# Patient Record
Sex: Female | Born: 1950 | Race: White | Hispanic: No | Marital: Married | Smoking: Never smoker
Health system: Southern US, Community
[De-identification: ages and names within clinical notes are randomized; demographics above are authoritative.]

## PROBLEM LIST (undated history)

## (undated) DIAGNOSIS — B029 Zoster without complications: Secondary | ICD-10-CM

## (undated) DIAGNOSIS — Z96659 Presence of unspecified artificial knee joint: Secondary | ICD-10-CM

## (undated) DIAGNOSIS — Z85850 Personal history of malignant neoplasm of thyroid: Secondary | ICD-10-CM

## (undated) DIAGNOSIS — R7303 Prediabetes: Secondary | ICD-10-CM

## (undated) DIAGNOSIS — C801 Malignant (primary) neoplasm, unspecified: Secondary | ICD-10-CM

## (undated) DIAGNOSIS — Z9889 Other specified postprocedural states: Secondary | ICD-10-CM

## (undated) DIAGNOSIS — Z5189 Encounter for other specified aftercare: Secondary | ICD-10-CM

## (undated) DIAGNOSIS — Z789 Other specified health status: Secondary | ICD-10-CM

## (undated) DIAGNOSIS — C73 Malignant neoplasm of thyroid gland: Secondary | ICD-10-CM

## (undated) DIAGNOSIS — G4730 Sleep apnea, unspecified: Secondary | ICD-10-CM

## (undated) DIAGNOSIS — N189 Chronic kidney disease, unspecified: Secondary | ICD-10-CM

## (undated) DIAGNOSIS — M1990 Unspecified osteoarthritis, unspecified site: Secondary | ICD-10-CM

## (undated) DIAGNOSIS — IMO0001 Reserved for inherently not codable concepts without codable children: Secondary | ICD-10-CM

## (undated) DIAGNOSIS — I10 Essential (primary) hypertension: Secondary | ICD-10-CM

## (undated) DIAGNOSIS — D1800 Hemangioma unspecified site: Secondary | ICD-10-CM

## (undated) DIAGNOSIS — R112 Nausea with vomiting, unspecified: Secondary | ICD-10-CM

## (undated) DIAGNOSIS — G709 Myoneural disorder, unspecified: Secondary | ICD-10-CM

## (undated) DIAGNOSIS — Z973 Presence of spectacles and contact lenses: Secondary | ICD-10-CM

## (undated) DIAGNOSIS — K219 Gastro-esophageal reflux disease without esophagitis: Secondary | ICD-10-CM

## (undated) DIAGNOSIS — E039 Hypothyroidism, unspecified: Secondary | ICD-10-CM

## (undated) HISTORY — PX: JOINT REPLACEMENT: SHX530

## (undated) HISTORY — DX: Other specified health status: Z78.9

## (undated) HISTORY — DX: Hemangioma unspecified site: D18.00

## (undated) HISTORY — PX: CHOLECYSTECTOMY: SHX55

## (undated) HISTORY — PX: APPENDECTOMY: SHX54

## (undated) HISTORY — PX: COLOSTOMY REVERSAL: SHX5782

## (undated) HISTORY — DX: Malignant neoplasm of thyroid gland: C73

## (undated) HISTORY — PX: COLON SURGERY: SHX602

## (undated) HISTORY — PX: GANGLION CYST EXCISION: SHX1691

## (undated) HISTORY — PX: OTHER SURGICAL HISTORY: SHX169

---
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
## 2005-12-18 ENCOUNTER — Inpatient Hospital Stay: Payer: Self-pay | Admitting: General Surgery

## 2005-12-22 ENCOUNTER — Other Ambulatory Visit: Payer: Self-pay

## 2006-01-15 ENCOUNTER — Inpatient Hospital Stay: Payer: Self-pay | Admitting: Surgery

## 2006-05-02 ENCOUNTER — Ambulatory Visit: Payer: Self-pay | Admitting: Surgery

## 2006-05-06 ENCOUNTER — Ambulatory Visit: Payer: Self-pay | Admitting: Surgery

## 2006-05-24 ENCOUNTER — Ambulatory Visit: Payer: Self-pay | Admitting: Surgery

## 2006-05-24 ENCOUNTER — Other Ambulatory Visit: Payer: Self-pay

## 2006-05-31 ENCOUNTER — Inpatient Hospital Stay: Payer: Self-pay | Admitting: Surgery

## 2007-01-05 HISTORY — PX: TOTAL KNEE ARTHROPLASTY: SHX125

## 2007-01-05 HISTORY — PX: THYROIDECTOMY: SHX17

## 2007-05-08 ENCOUNTER — Ambulatory Visit: Payer: Self-pay | Admitting: Specialist

## 2007-05-08 ENCOUNTER — Other Ambulatory Visit: Payer: Self-pay

## 2007-05-25 ENCOUNTER — Inpatient Hospital Stay: Payer: Self-pay | Admitting: Specialist

## 2007-09-29 ENCOUNTER — Ambulatory Visit: Payer: Self-pay | Admitting: Family Medicine

## 2009-06-24 ENCOUNTER — Encounter: Admission: RE | Admit: 2009-06-24 | Discharge: 2009-06-24 | Payer: Self-pay | Admitting: Orthopedic Surgery

## 2009-06-30 ENCOUNTER — Encounter: Admission: RE | Admit: 2009-06-30 | Discharge: 2009-06-30 | Payer: Self-pay | Admitting: Orthopedic Surgery

## 2009-07-15 ENCOUNTER — Encounter: Admission: RE | Admit: 2009-07-15 | Discharge: 2009-07-15 | Payer: Self-pay | Admitting: Orthopedic Surgery

## 2009-08-27 ENCOUNTER — Encounter: Admission: RE | Admit: 2009-08-27 | Discharge: 2009-08-27 | Payer: Self-pay | Admitting: Orthopedic Surgery

## 2010-10-22 ENCOUNTER — Other Ambulatory Visit (HOSPITAL_COMMUNITY): Payer: Self-pay | Admitting: Orthopedic Surgery

## 2010-10-22 DIAGNOSIS — T84033A Mechanical loosening of internal left knee prosthetic joint, initial encounter: Secondary | ICD-10-CM

## 2010-10-28 ENCOUNTER — Ambulatory Visit (HOSPITAL_COMMUNITY): Payer: Self-pay

## 2010-10-28 ENCOUNTER — Encounter (HOSPITAL_COMMUNITY)
Admission: RE | Admit: 2010-10-28 | Discharge: 2010-10-28 | Disposition: A | Discharge status: Home / Self Care | Payer: 59 | Source: Ambulatory Visit | Attending: Orthopedic Surgery | Admitting: Orthopedic Surgery

## 2010-10-28 DIAGNOSIS — Z96659 Presence of unspecified artificial knee joint: Secondary | ICD-10-CM | POA: Insufficient documentation

## 2010-10-28 DIAGNOSIS — M25569 Pain in unspecified knee: Secondary | ICD-10-CM | POA: Insufficient documentation

## 2010-10-28 DIAGNOSIS — M79609 Pain in unspecified limb: Secondary | ICD-10-CM | POA: Insufficient documentation

## 2010-10-28 DIAGNOSIS — T84033A Mechanical loosening of internal left knee prosthetic joint, initial encounter: Secondary | ICD-10-CM

## 2010-10-28 DIAGNOSIS — Z85850 Personal history of malignant neoplasm of thyroid: Secondary | ICD-10-CM | POA: Insufficient documentation

## 2010-10-28 MED ORDER — TECHNETIUM TC 99M MEDRONATE IV KIT
25.0000 | PACK | Freq: Once | INTRAVENOUS | Status: AC | PRN
  Administered 2010-10-28: 25 via INTRAVENOUS

## 2010-11-18 ENCOUNTER — Other Ambulatory Visit: Payer: Self-pay | Admitting: Orthopedic Surgery

## 2010-11-19 ENCOUNTER — Other Ambulatory Visit: Payer: Self-pay | Admitting: Orthopedic Surgery

## 2010-12-02 ENCOUNTER — Encounter (HOSPITAL_COMMUNITY): Payer: Self-pay | Admitting: Pharmacy Technician

## 2010-12-04 ENCOUNTER — Encounter (HOSPITAL_COMMUNITY): Payer: Self-pay

## 2010-12-04 ENCOUNTER — Other Ambulatory Visit: Payer: Self-pay

## 2010-12-04 ENCOUNTER — Encounter (HOSPITAL_COMMUNITY)
Admission: RE | Admit: 2010-12-04 | Discharge: 2010-12-04 | Disposition: A | Discharge status: Home / Self Care | Payer: 59 | Source: Ambulatory Visit | Attending: Orthopedic Surgery | Admitting: Orthopedic Surgery

## 2010-12-04 ENCOUNTER — Encounter (HOSPITAL_COMMUNITY)
Admission: RE | Admit: 2010-12-04 | Discharge: 2010-12-04 | Disposition: A | Discharge status: Home / Self Care | Payer: 59 | Source: Ambulatory Visit | Attending: Anesthesiology | Admitting: Anesthesiology

## 2010-12-04 HISTORY — DX: Reserved for inherently not codable concepts without codable children: IMO0001

## 2010-12-04 HISTORY — DX: Malignant (primary) neoplasm, unspecified: C80.1

## 2010-12-04 HISTORY — DX: Unspecified osteoarthritis, unspecified site: M19.90

## 2010-12-04 HISTORY — DX: Nausea with vomiting, unspecified: R11.2

## 2010-12-04 HISTORY — DX: Encounter for other specified aftercare: Z51.89

## 2010-12-04 HISTORY — DX: Other specified postprocedural states: Z98.890

## 2010-12-04 HISTORY — DX: Hypothyroidism, unspecified: E03.9

## 2010-12-04 HISTORY — DX: Essential (primary) hypertension: I10

## 2010-12-04 HISTORY — DX: Gastro-esophageal reflux disease without esophagitis: K21.9

## 2010-12-04 LAB — BASIC METABOLIC PANEL
CO2: 27 mEq/L (ref 19–32)
Calcium: 9.6 mg/dL (ref 8.4–10.5)
GFR calc Af Amer: 86 mL/min — ABNORMAL LOW (ref >90)
GFR calc non Af Amer: 75 mL/min — ABNORMAL LOW (ref >90)
Sodium: 141 mEq/L (ref 135–145)

## 2010-12-04 LAB — CBC
MCV: 93.5 fL (ref 78.0–100.0)
Platelets: 278 10*3/uL (ref 150–400)
RBC: 3.98 MIL/uL (ref 3.87–5.11)
WBC: 7.4 10*3/uL (ref 4.0–10.5)

## 2010-12-04 LAB — PROTIME-INR
INR: 1 (ref 0.00–1.49)
Prothrombin Time: 13.4 seconds (ref 11.6–15.2)

## 2010-12-04 LAB — APTT: aPTT: 27 seconds (ref 24–37)

## 2010-12-04 LAB — TYPE AND SCREEN: ABO/RH(D): O POS

## 2010-12-04 NOTE — Pre-Procedure Instructions (Addendum)
20 Pamela Dodson [facility]  12/04/2010   Your procedure is scheduled on:  December 12  Report to [facility] at 10:45 AM.  Call this number if you have problems the morning of surgery: [phone]   Remember:   Do not eat food:After Midnight.  May have clear liquids: up to 4 Hours before arrival.  Clear liquids include soda, tea, black coffee, apple or grape juice, broth.  Take these medicines the morning of surgery with A SIP OF WATER: synthroid, prilosec, tramadol   Do not wear jewelry, make-up or nail polish.  Do not wear lotions, powders, or perfumes. You may wear deodorant.  Do not shave 48 hours prior to surgery.  Do not bring valuables to the hospital.  Contacts, dentures or bridgework may not be worn into surgery.  Leave suitcase in the car. After surgery it may be brought to your room.  For patients admitted to the hospital, checkout time is 11:00 AM the day of discharge.   Patients discharged the day of surgery will not be allowed to drive home.  Name and phone number of your driver: NA  Special Instructions: Incentive Spirometry - Practice and bring it with you on the day of surgery. and CHG Shower Use Special Wash: 1/2 bottle night before surgery and 1/2 bottle morning of surgery.   Please read over the following fact sheets that you were given: Pain Booklet, Coughing and Deep Breathing, Blood Transfusion Information, Total Joint Packet and Surgical Site Infection Prevention

## 2010-12-15 ENCOUNTER — Encounter (HOSPITAL_COMMUNITY): Payer: Self-pay | Admitting: Orthopedic Surgery

## 2010-12-15 NOTE — H&P (Signed)
  HISTORY OF PRESENT ILLNESS:  Pamela Dodson is a 60 year old patient of Dr. Charlett Blake who comes in today complaining of pain in her left knee.  She is status post bilateral knee arthroplasty.  Her surgeries were done by Dr. Reita Chard in [place] 3 years ago.  She had both knees replaced on the same day. 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  She has had 2 intra-articular cortisone injections that provided her with significant short-term relief. She reports that she actually got relief of her left knee pain while her left hip injection was working.  She localizes most of her knee pain to the lateral side.  The right total  knee continues to do well.  ROS: Patient denies dizziness, nausea, fever, chills, vomiting, shortness of breath, chest pain, loss of appetite, or rash.    PHYSICAL EXAM: Well-developed, well-nourished.  Awake, alert, and oriented x3.  Extraocular motion is intact.  No use of accessory respiratory muscles for breathing.   Cardiovascular exam reveals a regular rhythm.  Skin is intact without cuts, scrapes, or abrasions. The left knee demonstrates a well healed surgical incision.  She is nontender to palpation along the joint line.  Range of motion is 0-110 degrees.  The hip demonstrates pain with internal rotation. She has no pain with external rotation.  Foot tap is negative.  She also reports some left knee pain with internal rotation of the left hip.  RADIOGRAPHS: Other reviewed images - Two views taken of the left knee taken a couple of weeks ago demonstrates well placed, well fixed total knee components without evidence of loosening.  We also reviewed her hip x-rays that Dr. Charlett Blake took and she has end stage arthritis of the left hip.  IMPRESSION:   1.    Painful left total knee. 2.    End stage arthritis of the left hip.  PLAN:  We have reviewed a bone scan of the lower extremities to rule out any loosening of the total knee parts. There is no evidence of  loosening or infection in either total knee.  We have advised [name] that her knee pain is most likely related to her hip arthritis.  At this point she would like to proceed with left total hip arthroplasty.  All risks and benefits of surgery were discussed with the patient.

## 2010-12-16 ENCOUNTER — Encounter (HOSPITAL_COMMUNITY): Payer: Self-pay | Admitting: Anesthesiology

## 2010-12-16 ENCOUNTER — Encounter (HOSPITAL_COMMUNITY)
Admission: RE | Disposition: A | Discharge status: Home Health | Payer: Self-pay | Source: Ambulatory Visit | Attending: Orthopedic Surgery

## 2010-12-16 ENCOUNTER — Encounter (HOSPITAL_COMMUNITY): Payer: Self-pay | Admitting: Orthopedic Surgery

## 2010-12-16 ENCOUNTER — Inpatient Hospital Stay (HOSPITAL_COMMUNITY): Payer: 59

## 2010-12-16 ENCOUNTER — Inpatient Hospital Stay (HOSPITAL_COMMUNITY)
Admission: RE | Admit: 2010-12-16 | Discharge: 2010-12-19 | DRG: 470 | Disposition: A | Discharge status: Home Health | Payer: 59 | Source: Ambulatory Visit | Attending: Orthopedic Surgery | Admitting: Orthopedic Surgery

## 2010-12-16 ENCOUNTER — Inpatient Hospital Stay (HOSPITAL_COMMUNITY): Payer: 59 | Admitting: Anesthesiology

## 2010-12-16 DIAGNOSIS — M129 Arthropathy, unspecified: Secondary | ICD-10-CM | POA: Diagnosis present

## 2010-12-16 DIAGNOSIS — I10 Essential (primary) hypertension: Secondary | ICD-10-CM | POA: Diagnosis present

## 2010-12-16 DIAGNOSIS — M1610 Unilateral primary osteoarthritis, unspecified hip: Principal | ICD-10-CM

## 2010-12-16 DIAGNOSIS — E039 Hypothyroidism, unspecified: Secondary | ICD-10-CM | POA: Diagnosis present

## 2010-12-16 DIAGNOSIS — Z85850 Personal history of malignant neoplasm of thyroid: Secondary | ICD-10-CM

## 2010-12-16 DIAGNOSIS — M169 Osteoarthritis of hip, unspecified: Principal | ICD-10-CM | POA: Diagnosis present

## 2010-12-16 DIAGNOSIS — K219 Gastro-esophageal reflux disease without esophagitis: Secondary | ICD-10-CM | POA: Diagnosis present

## 2010-12-16 HISTORY — PX: TOTAL HIP ARTHROPLASTY: SHX124

## 2010-12-16 SURGERY — ARTHROPLASTY, HIP, TOTAL,POSTERIOR APPROACH
Anesthesia: General | Site: Hip | Laterality: Left | Wound class: Clean

## 2010-12-16 MED ORDER — NEOSTIGMINE METHYLSULFATE 1 MG/ML IJ SOLN
INTRAMUSCULAR | Status: DC | PRN
  Administered 2010-12-16: 5 mg via INTRAVENOUS

## 2010-12-16 MED ORDER — GLYCOPYRROLATE 0.2 MG/ML IJ SOLN
INTRAMUSCULAR | Status: DC | PRN
  Administered 2010-12-16: .7 mg via INTRAVENOUS

## 2010-12-16 MED ORDER — PHENYLEPHRINE HCL 10 MG/ML IJ SOLN
INTRAMUSCULAR | Status: DC | PRN
  Administered 2010-12-16 (×3): 80 ug via INTRAVENOUS

## 2010-12-16 MED ORDER — SODIUM CHLORIDE 0.9 % IR SOLN
Status: DC | PRN
  Administered 2010-12-16: 1000 mL

## 2010-12-16 MED ORDER — ROCURONIUM BROMIDE 100 MG/10ML IV SOLN
INTRAVENOUS | Status: DC | PRN
  Administered 2010-12-16: 50 mg via INTRAVENOUS

## 2010-12-16 MED ORDER — PROPOFOL 10 MG/ML IV EMUL
INTRAVENOUS | Status: DC | PRN
  Administered 2010-12-16: 100 mg via INTRAVENOUS

## 2010-12-16 MED ORDER — KCL IN DEXTROSE-NACL 20-5-0.45 MEQ/L-%-% IV SOLN
INTRAVENOUS | Status: DC
  Administered 2010-12-16 – 2010-12-17 (×2): via INTRAVENOUS
  Filled 2010-12-16 (×12): qty 1000

## 2010-12-16 MED ORDER — ZOLPIDEM TARTRATE 5 MG PO TABS: 5.0000 mg | ORAL_TABLET | Freq: Every evening | ORAL | Status: DC | PRN

## 2010-12-16 MED ORDER — SIMVASTATIN 20 MG PO TABS
20.0000 mg | ORAL_TABLET | Freq: Every day | ORAL | Status: DC
  Administered 2010-12-16 – 2010-12-18 (×3): 20 mg via ORAL
  Filled 2010-12-16 (×4): qty 1

## 2010-12-16 MED ORDER — MIDAZOLAM HCL 2 MG/2ML IJ SOLN
1.0000 mg | Freq: Once | INTRAMUSCULAR | Status: AC
  Administered 2010-12-16: 1 mg via INTRAVENOUS

## 2010-12-16 MED ORDER — BISACODYL 5 MG PO TBEC: 5.0000 mg | DELAYED_RELEASE_TABLET | Freq: Every day | ORAL | Status: DC | PRN

## 2010-12-16 MED ORDER — WARFARIN VIDEO: Freq: Once | Status: DC

## 2010-12-16 MED ORDER — BUPIVACAINE-EPINEPHRINE 0.5% -1:200000 IJ SOLN
INTRAMUSCULAR | Status: DC | PRN
  Administered 2010-12-16: 10 mL

## 2010-12-16 MED ORDER — HYDROMORPHONE HCL PF 1 MG/ML IJ SOLN
0.2500 mg | INTRAMUSCULAR | Status: DC | PRN
  Administered 2010-12-16 (×4): 0.5 mg via INTRAVENOUS

## 2010-12-16 MED ORDER — COUMADIN BOOK
Freq: Once | Status: DC
  Filled 2010-12-16: qty 1

## 2010-12-16 MED ORDER — CHLORHEXIDINE GLUCONATE 4 % EX LIQD: 60.0000 mL | Freq: Once | CUTANEOUS | Status: DC

## 2010-12-16 MED ORDER — METOCLOPRAMIDE HCL 10 MG PO TABS
5.0000 mg | ORAL_TABLET | Freq: Three times a day (TID) | ORAL | Status: DC | PRN
  Administered 2010-12-18 (×2): 10 mg via ORAL
  Filled 2010-12-16: qty 1

## 2010-12-16 MED ORDER — LACTATED RINGERS IV SOLN
INTRAVENOUS | Status: DC
  Administered 2010-12-16: 11:00:00 via INTRAVENOUS

## 2010-12-16 MED ORDER — LACTATED RINGERS IV SOLN
INTRAVENOUS | Status: DC | PRN
  Administered 2010-12-16 (×2): via INTRAVENOUS

## 2010-12-16 MED ORDER — MAGNESIUM HYDROXIDE 400 MG/5ML PO SUSP
30.0000 mL | Freq: Every day | ORAL | Status: DC | PRN
  Administered 2010-12-18: 30 mL via ORAL
  Filled 2010-12-16: qty 30

## 2010-12-16 MED ORDER — HYDROMORPHONE HCL PF 1 MG/ML IJ SOLN
INTRAMUSCULAR | Status: AC
  Filled 2010-12-16: qty 1

## 2010-12-16 MED ORDER — BENAZEPRIL HCL 20 MG PO TABS
20.0000 mg | ORAL_TABLET | Freq: Every day | ORAL | Status: DC
  Administered 2010-12-16 – 2010-12-17 (×2): 20 mg via ORAL
  Filled 2010-12-16 (×3): qty 1

## 2010-12-16 MED ORDER — METOCLOPRAMIDE HCL 5 MG/ML IJ SOLN
5.0000 mg | Freq: Three times a day (TID) | INTRAMUSCULAR | Status: DC | PRN
  Filled 2010-12-16: qty 2

## 2010-12-16 MED ORDER — ENOXAPARIN SODIUM 40 MG/0.4ML ~~LOC~~ SOLN
40.0000 mg | SUBCUTANEOUS | Status: DC
  Administered 2010-12-17 – 2010-12-19 (×3): 40 mg via SUBCUTANEOUS
  Filled 2010-12-16 (×4): qty 0.4

## 2010-12-16 MED ORDER — WARFARIN SODIUM 5 MG PO TABS
5.0000 mg | ORAL_TABLET | Freq: Once | ORAL | Status: AC
  Administered 2010-12-16: 5 mg via ORAL
  Filled 2010-12-16: qty 1

## 2010-12-16 MED ORDER — MEPERIDINE HCL 25 MG/ML IJ SOLN
6.2500 mg | INTRAMUSCULAR | Status: DC | PRN
  Administered 2010-12-16: 12.5 mg via INTRAVENOUS

## 2010-12-16 MED ORDER — HYDROCODONE-ACETAMINOPHEN 5-325 MG PO TABS
1.0000 | ORAL_TABLET | ORAL | Status: DC | PRN
  Administered 2010-12-18 – 2010-12-19 (×3): 2 via ORAL
  Filled 2010-12-16 (×3): qty 2

## 2010-12-16 MED ORDER — ACETAMINOPHEN 325 MG PO TABS
650.0000 mg | ORAL_TABLET | Freq: Four times a day (QID) | ORAL | Status: DC | PRN
  Administered 2010-12-17 (×2): 650 mg via ORAL
  Filled 2010-12-16 (×2): qty 2

## 2010-12-16 MED ORDER — AMLODIPINE BESY-BENAZEPRIL HCL 10-20 MG PO CAPS
1.0000 | ORAL_CAPSULE | Freq: Every day | ORAL | Status: DC
Start: 2010-12-16 — End: 2010-12-16

## 2010-12-16 MED ORDER — CEFAZOLIN SODIUM 1-5 GM-% IV SOLN
INTRAVENOUS | Status: AC
  Filled 2010-12-16: qty 100

## 2010-12-16 MED ORDER — PANTOPRAZOLE SODIUM 40 MG PO TBEC
40.0000 mg | DELAYED_RELEASE_TABLET | Freq: Every day | ORAL | Status: DC
  Administered 2010-12-17 – 2010-12-19 (×3): 40 mg via ORAL
  Filled 2010-12-16 (×4): qty 1

## 2010-12-16 MED ORDER — DIPHENHYDRAMINE HCL 12.5 MG/5ML PO ELIX
12.5000 mg | ORAL_SOLUTION | ORAL | Status: DC | PRN
Start: 2010-12-16 — End: 2010-12-19
  Filled 2010-12-16: qty 10

## 2010-12-16 MED ORDER — ONDANSETRON HCL 4 MG/2ML IJ SOLN
INTRAMUSCULAR | Status: DC | PRN
  Administered 2010-12-16: 4 mg via INTRAVENOUS

## 2010-12-16 MED ORDER — HYDROMORPHONE HCL PF 1 MG/ML IJ SOLN
0.5000 mg | INTRAMUSCULAR | Status: DC | PRN
  Administered 2010-12-16 – 2010-12-17 (×8): 1 mg via INTRAVENOUS
  Filled 2010-12-16 (×7): qty 1

## 2010-12-16 MED ORDER — LIDOCAINE HCL (CARDIAC) 20 MG/ML IV SOLN
INTRAVENOUS | Status: DC | PRN
  Administered 2010-12-16: 40 mg via INTRAVENOUS

## 2010-12-16 MED ORDER — ONDANSETRON HCL 4 MG/2ML IJ SOLN: 4.0000 mg | Freq: Once | INTRAMUSCULAR | Status: DC | PRN

## 2010-12-16 MED ORDER — OXYCODONE HCL 5 MG PO TABS
5.0000 mg | ORAL_TABLET | ORAL | Status: DC | PRN
  Administered 2010-12-16 – 2010-12-19 (×14): 10 mg via ORAL
  Filled 2010-12-16 (×14): qty 2

## 2010-12-16 MED ORDER — MENTHOL 3 MG MT LOZG: 1.0000 | LOZENGE | OROMUCOSAL | Status: DC | PRN

## 2010-12-16 MED ORDER — ACETAMINOPHEN 650 MG RE SUPP: 650.0000 mg | Freq: Four times a day (QID) | RECTAL | Status: DC | PRN

## 2010-12-16 MED ORDER — OLMESARTAN MEDOXOMIL 20 MG PO TABS
20.0000 mg | ORAL_TABLET | Freq: Every day | ORAL | Status: DC
  Administered 2010-12-16 – 2010-12-19 (×3): 20 mg via ORAL
  Filled 2010-12-16 (×4): qty 1

## 2010-12-16 MED ORDER — ONDANSETRON HCL 4 MG PO TABS
4.0000 mg | ORAL_TABLET | Freq: Four times a day (QID) | ORAL | Status: DC | PRN
  Administered 2010-12-17: 4 mg via ORAL
  Filled 2010-12-16: qty 1

## 2010-12-16 MED ORDER — CAMPHOR-MENTHOL-METHYL SAL 1.2-5.7-6.3 % EX PTCH: 1.0000 | MEDICATED_PATCH | Freq: Every day | CUTANEOUS | Status: DC | PRN

## 2010-12-16 MED ORDER — SUFENTANIL CITRATE 50 MCG/ML IV SOLN
INTRAVENOUS | Status: DC | PRN
  Administered 2010-12-16: 5 ug via INTRAVENOUS
  Administered 2010-12-16: 10 ug via INTRAVENOUS
  Administered 2010-12-16 (×2): 25 ug via INTRAVENOUS

## 2010-12-16 MED ORDER — CEFUROXIME SODIUM 1.5 G IJ SOLR: INTRAMUSCULAR | Status: DC | PRN

## 2010-12-16 MED ORDER — ONDANSETRON HCL 4 MG/2ML IJ SOLN
4.0000 mg | Freq: Four times a day (QID) | INTRAMUSCULAR | Status: DC | PRN
  Administered 2010-12-17: 4 mg via INTRAVENOUS
  Filled 2010-12-16: qty 2

## 2010-12-16 MED ORDER — FLEET ENEMA 7-19 GM/118ML RE ENEM: 1.0000 | ENEMA | Freq: Once | RECTAL | Status: AC | PRN

## 2010-12-16 MED ORDER — MIDAZOLAM HCL 5 MG/5ML IJ SOLN
INTRAMUSCULAR | Status: DC | PRN
  Administered 2010-12-16: 2 mg via INTRAVENOUS

## 2010-12-16 MED ORDER — PHENOL 1.4 % MT LIQD: 1.0000 | OROMUCOSAL | Status: DC | PRN

## 2010-12-16 MED ORDER — ALUM & MAG HYDROXIDE-SIMETH 200-200-20 MG/5ML PO SUSP: 30.0000 mL | ORAL | Status: DC | PRN

## 2010-12-16 MED ORDER — LEVOTHYROXINE SODIUM 200 MCG PO TABS
200.0000 ug | ORAL_TABLET | Freq: Every day | ORAL | Status: DC
  Administered 2010-12-17 – 2010-12-19 (×3): 200 ug via ORAL
  Filled 2010-12-16 (×4): qty 1

## 2010-12-16 MED ORDER — AMLODIPINE BESYLATE 10 MG PO TABS
10.0000 mg | ORAL_TABLET | Freq: Every day | ORAL | Status: DC
  Administered 2010-12-16 – 2010-12-17 (×2): 10 mg via ORAL
  Filled 2010-12-16 (×3): qty 1

## 2010-12-16 SURGICAL SUPPLY — 59 items
BLADE SAW SAG 73X25 THK (BLADE) ×1
BLADE SAW SGTL 18X1.27X75 (BLADE) IMPLANT
BLADE SAW SGTL 73X25 THK (BLADE) ×1 IMPLANT
BLADE SAW SGTL MED 73X18.5 STR (BLADE) IMPLANT
BRUSH FEMORAL CANAL (MISCELLANEOUS) IMPLANT
CLOTH BEACON ORANGE TIMEOUT ST (SAFETY) ×2 IMPLANT
COVER BACK TABLE 24X17X13 BIG (DRAPES) ×2 IMPLANT
COVER SURGICAL LIGHT HANDLE (MISCELLANEOUS) ×4 IMPLANT
DRAPE ORTHO SPLIT 77X108 STRL (DRAPES) ×1
DRAPE PROXIMA HALF (DRAPES) ×2 IMPLANT
DRAPE SURG ORHT 6 SPLT 77X108 (DRAPES) ×1 IMPLANT
DRAPE U-SHAPE 47X51 STRL (DRAPES) ×2 IMPLANT
DRILL BIT 7/64X5 (BIT) ×2 IMPLANT
DRSG MEPILEX BORDER 4X12 (GAUZE/BANDAGES/DRESSINGS) IMPLANT
DRSG MEPILEX BORDER 4X8 (GAUZE/BANDAGES/DRESSINGS) IMPLANT
DURAPREP 26ML APPLICATOR (WOUND CARE) ×2 IMPLANT
ELECT BLADE 4.0 EZ CLEAN MEGAD (MISCELLANEOUS)
ELECT REM PT RETURN 9FT ADLT (ELECTROSURGICAL) ×2
ELECTRODE BLDE 4.0 EZ CLN MEGD (MISCELLANEOUS) IMPLANT
ELECTRODE REM PT RTRN 9FT ADLT (ELECTROSURGICAL) ×1 IMPLANT
FLOSEAL 10ML (HEMOSTASIS) IMPLANT
GAUZE XEROFORM 1X8 LF (GAUZE/BANDAGES/DRESSINGS) IMPLANT
GLOVE BIO SURGEON STRL SZ7 (GLOVE) ×2 IMPLANT
GLOVE BIO SURGEON STRL SZ7.5 (GLOVE) ×2 IMPLANT
GLOVE BIOGEL PI IND STRL 6.5 (GLOVE) ×1 IMPLANT
GLOVE BIOGEL PI IND STRL 7.0 (GLOVE) ×2 IMPLANT
GLOVE BIOGEL PI IND STRL 8 (GLOVE) ×1 IMPLANT
GLOVE BIOGEL PI INDICATOR 6.5 (GLOVE) ×1
GLOVE BIOGEL PI INDICATOR 7.0 (GLOVE) ×2
GLOVE BIOGEL PI INDICATOR 8 (GLOVE) ×1
GLOVE SS BIOGEL STRL SZ 6.5 (GLOVE) ×1 IMPLANT
GLOVE SUPERSENSE BIOGEL SZ 6.5 (GLOVE) ×1
GLOVE SURG SS PI 6.5 STRL IVOR (GLOVE) ×2 IMPLANT
GOWN PREVENTION PLUS XLARGE (GOWN DISPOSABLE) ×2 IMPLANT
GOWN STRL NON-REIN LRG LVL3 (GOWN DISPOSABLE) ×6 IMPLANT
HANDPIECE INTERPULSE COAX TIP (DISPOSABLE)
HOOD PEEL AWAY FACE SHEILD DIS (HOOD) ×4 IMPLANT
KIT BASIN OR (CUSTOM PROCEDURE TRAY) ×2 IMPLANT
KIT ROOM TURNOVER OR (KITS) ×2 IMPLANT
MANIFOLD NEPTUNE II (INSTRUMENTS) ×2 IMPLANT
NEEDLE 22X1 1/2 (OR ONLY) (NEEDLE) ×2 IMPLANT
NS IRRIG 1000ML POUR BTL (IV SOLUTION) ×2 IMPLANT
PACK TOTAL JOINT (CUSTOM PROCEDURE TRAY) ×2 IMPLANT
PAD ARMBOARD 7.5X6 YLW CONV (MISCELLANEOUS) ×4 IMPLANT
PASSER SUT SWANSON 36MM LOOP (INSTRUMENTS) ×2 IMPLANT
PRESSURIZER FEMORAL UNIV (MISCELLANEOUS) IMPLANT
SET HNDPC FAN SPRY TIP SCT (DISPOSABLE) IMPLANT
SUT ETHIBOND 2 V 37 (SUTURE) ×4 IMPLANT
SUT ETHILON 3 0 FSL (SUTURE) ×4 IMPLANT
SUT VIC AB 0 CTB1 27 (SUTURE) ×2 IMPLANT
SUT VIC AB 1 CTX 36 (SUTURE) ×1
SUT VIC AB 1 CTX36XBRD ANBCTR (SUTURE) ×1 IMPLANT
SUT VIC AB 2-0 CTB1 (SUTURE) ×2 IMPLANT
SYR CONTROL 10ML LL (SYRINGE) ×2 IMPLANT
TOWEL OR 17X24 6PK STRL BLUE (TOWEL DISPOSABLE) ×2 IMPLANT
TOWEL OR 17X26 10 PK STRL BLUE (TOWEL DISPOSABLE) ×2 IMPLANT
TOWER CARTRIDGE SMART MIX (DISPOSABLE) IMPLANT
TRAY FOLEY CATH 14FR (SET/KITS/TRAYS/PACK) ×2 IMPLANT
WATER STERILE IRR 1000ML POUR (IV SOLUTION) ×4 IMPLANT

## 2010-12-16 NOTE — Preoperative (Signed)
Beta Blockers   Reason not to administer Beta Blockers:Not Applicable 

## 2010-12-16 NOTE — Progress Notes (Signed)
ANTICOAGULATION CONSULT NOTE - Initial Consult  Pharmacy Consult for Coumadin Indication: VTE prophylaxis  No Known Allergies  Patient Measurements: Height: 5\' 3"  (160 cm) Weight: 189 lb (85.73 kg) IBW/kg (Calculated) : 52.4  Adjusted Body Weight:   Vital Signs: Temp: 99 F (37.2 C) (12/12 1530) Temp src: Oral (12/12 0945) BP: 132/60 mmHg (12/12 1521) Pulse Rate: 99  (12/12 1521)  Labs: No results found for this basename: HGB:2,HCT:3,PLT:3,APTT:3,LABPROT:3,INR:3,HEPARINUNFRC:3,CREATININE:3,CKTOTAL:3,CKMB:3,TROPONINI:3 in the last 72 hours Estimated Creatinine Clearance: 73.8 ml/min (by C-G formula based on Cr of 0.83).  Medical History: Past Medical History  Diagnosis Date  . PONV (postoperative nausea and vomiting)     usually needs zofran/antiemetic prior to surgery  . Hypertension   . Hypothyroidism   . Cancer     hx thyroid cancer  . Blood transfusion   . GERD (gastroesophageal reflux disease)   . Arthritis     Medications:  Prescriptions prior to admission  Medication Sig Dispense Refill  . acetaminophen (TYLENOL) 500 MG tablet Take 500-1,000 mg by mouth every 6 (six) hours as needed. As needed for pain.       Marland Kitchen amLODipine-benazepril (LOTREL) 10-20 MG per capsule Take 1 capsule by mouth daily.        Marland Kitchen atorvastatin (LIPITOR) 20 MG tablet Take 20 mg by mouth daily.        Vedia Coffer Cohosh (REMIFEMIN PO) Take 1 tablet by mouth 2 (two) times daily. Over the counter medication. For hot flashes.       . Camphor-Menthol-Methyl Sal (SALONPAS) 1.2-5.7-6.3 % PTCH Apply 1 patch topically daily as needed. As needed for pain.       . indomethacin (INDOCIN SR) 75 MG CR capsule Take 75 mg by mouth 2 (two) times daily with a meal.        . levothyroxine (SYNTHROID, LEVOTHROID) 200 MCG tablet Take 200 mcg by mouth daily.        Marland Kitchen olmesartan (BENICAR) 20 MG tablet Take 20 mg by mouth daily.        Marland Kitchen omeprazole (PRILOSEC) 20 MG capsule Take 20 mg by mouth daily.       Marland Kitchen OVER THE  COUNTER MEDICATION 1 tablet daily. Glucosamine OTC.      . traMADol (ULTRAM) 50 MG tablet Take 50 mg by mouth every 6 (six) hours as needed. Maximum dose= 8 tablets per day. As needed for pain.       Marland Kitchen trolamine salicylate (ASPERCREME) 10 % cream Apply 1 application topically 2 (two) times daily as needed. As needed for pain relief.         Assessment: Patient is a 60 y.o. F s/p left THA today.  To start Coumadin for VTE prophylaxis. Baseline  INR on 11/30 was 1.0  Goal of Therapy:  INR 2-3   Plan:  1) Coumadin 5mg  PO x1 tonight  Sharron Simpson P 12/16/2010,4:14 PM

## 2010-12-16 NOTE — Anesthesia Postprocedure Evaluation (Signed)
  Anesthesia Post-op Note  Patient: Pamela Dodson  Procedure(s) Performed:  TOTAL HIP ARTHROPLASTY - Depuy  Patient Location: PACU  Anesthesia Type: General  Level of Consciousness: sedated  Airway and Oxygen Therapy: Patient Spontanous Breathing  Post-op Pain: moderate  Post-op Assessment: Post-op Vital signs reviewed  Post-op Vital Signs: stable  Complications: No apparent anesthesia complications

## 2010-12-16 NOTE — Interval H&P Note (Signed)
History and Physical Interval Note:  12/16/2010 11:15 AM  Pamela Dodson  has presented today for surgery, with the diagnosis of Osteoarthritis Left Hip  The various methods of treatment have been discussed with the patient and family. After consideration of risks, benefits and other options for treatment, the patient has consented to  Procedure(s): TOTAL HIP ARTHROPLASTY as a surgical intervention .  The patients' history has been reviewed, patient examined, no change in status, stable for surgery.  I have reviewed the patients' chart and labs.  Questions were answered to the patient's satisfaction.     Nestor Lewandowsky

## 2010-12-16 NOTE — Interval H&P Note (Signed)
History and Physical Interval Note:  12/16/2010 10:56 AM  Pamela Dodson  has presented today for surgery, with the diagnosis of Osteoarthritis Left Hip  The various methods of treatment have been discussed with the patient and family. After consideration of risks, benefits and other options for treatment, the patient has consented to  Procedure(s): TOTAL HIP ARTHROPLASTY as a surgical intervention .  The patients' history has been reviewed, patient examined, no change in status, stable for surgery.  I have reviewed the patients' chart and labs.  Questions were answered to the patient's satisfaction.     Nestor Lewandowsky

## 2010-12-16 NOTE — Transfer of Care (Signed)
Immediate Anesthesia Transfer of Care Note  Patient: Pamela Dodson  Procedure(s) Performed:  TOTAL HIP ARTHROPLASTY - Depuy  Patient Location: PACU  Anesthesia Type: General  Level of Consciousness: awake, oriented and sedated  Airway & Oxygen Therapy: Patient Spontanous Breathing and Patient connected to face mask oxygen  Post-op Assessment: Report given to PACU RN and Post -op Vital signs reviewed and stable  Post vital signs: Reviewed and stable  Complications: No apparent anesthesia complications

## 2010-12-16 NOTE — Plan of Care (Signed)
Problem: Consults Goal: Diagnosis- Total Joint Replacement Primary Total Hip     

## 2010-12-16 NOTE — Anesthesia Preprocedure Evaluation (Addendum)
Anesthesia Evaluation  Patient identified by MRN, date of birth, ID band Patient awake    Airway       Dental   Pulmonary          Cardiovascular hypertension, Pt. on medications     Neuro/Psych    GI/Hepatic GERD-  Medicated and Controlled,  Endo/Other    Renal/GU      Musculoskeletal   Abdominal   Peds  Hematology   Anesthesia Other Findings   Reproductive/Obstetrics                           Anesthesia Physical Anesthesia Plan  ASA: II  Anesthesia Plan: General   Post-op Pain Management:    Induction: Intravenous  Airway Management Planned: Oral ETT  Additional Equipment:   Intra-op Plan:   Post-operative Plan: Extubation in OR  Informed Consent: I have reviewed the patients History and Physical, chart, labs and discussed the procedure including the risks, benefits and alternatives for the proposed anesthesia with the patient or authorized representative who has indicated his/her understanding and acceptance.     Plan Discussed with: CRNA and Surgeon  Anesthesia Plan Comments:         Anesthesia Quick Evaluation

## 2010-12-16 NOTE — Op Note (Signed)
OPERATIVE REPORT    DATE OF PROCEDURE:  12/16/2010       PREOPERATIVE DIAGNOSIS:  Osteoarthritis Left Hip                                                       [name].     POSTOPERATIVE DIAGNOSIS:  Osteoarthritis Left Hip                                                           PROCEDURE:  L total hip arthroplasty using a 52 mm DePuy Pinnacle  Cup, [employer], 10-degree polyethylene liner index superior  and posterior, a +0 36 mm ceramic head, a #[id number] SROM stem, 18Dsm Cone   SURGEON: Abdimalik Mayorquin Dodson    ASSISTANT:   Mauricia Area, PA-C  (present throughout entire procedure and necessary for timely completion of the procedure)   ANESTHESIA:  Anesthesia type not filed in the log.  BLOOD LOSS: * No blood loss amount entered *  FLUID REPLACEMENT: 1800 crystalloid DRAINS: Foley Catheter URINE OUTPUT: 300 COMPLICATIONS:  None    INDICATIONS FOR PROCEDURE: [name] With  Osteoarthritis Left Hip   for 2 years, x-rays show bone-on-bone arthritic changes. Despite conservative measures with observation, anti-inflammatory medicine, narcotics, use of a cane, has severe unremitting pain and can ambulate only a few blocks before resting.  Patient desires elective L total hip arthroplasty to decrease pain and increase function. The risks, benefits, and alternatives were discussed at length including but not limited to the risks of infection, bleeding, nerve injury, stiffness, blood clots, the need for revision surgery, cardiopulmonary complications, among others, and they were willing to proceed.y have been discussed. Questions answered.     PROCEDURE IN DETAIL: The patient was identified by armband,  received preoperative IV antibiotics in the holding area at [facility], taken to the operating room , appropriate anesthetic monitors  were attached and general endotracheal anesthesia induced. Foley catheter was  inserted. She was rolled into the R lateral decubitus position and fixed there with a Stulberg Mark II pelvic clamp and the L lower extremity was then prepped and draped  in the usual sterile fashion from the ankle to the hemipelvis. A time-out  procedure was performed. The skin along the lateral hip and thigh  infiltrated with 10 mL of 0.5% Marcaine and epinephrine solution. We  then made a posterolateral approach to the hip. With a #10 blade, 20 cm  incision through skin and subcutaneous tissue down to the level of the  IT band. Small bleeders were identified and cauterized. IT band cut in  line with skin incision exposing the greater trochanter. A Cobra retractor was placed between the gluteus minimus and the superior hip joint capsule, and a spiked Cobra between the quadratus femoris and the inferior hip joint capsule. This isolated the short  external rotators and piriformis tendons. These were tagged with a #2 Ethibond  suture and cut off their insertion on the intertrochanteric crest. The posterior  capsule was then developed into an acetabular-based flap from Posterior Superior  off of the acetabulum out over the femoral neck and back posterior inferior to the acetabular rim. This flap was tagged with two #2 Ethibond sutures and retracted protecting the sciatic nerve. This exposed the arthritic femoral head and osteophytes. The hip was then flexed and internally rotated, dislocating the femoral head and a standard neck cut performed 1 fingerbreadth above the lesser trochanter.  A spiked Cobra was placed in the cotyloid notch and a Hohmann retractor was then used to lever the femur anteriorly off of the anterior pelvic column. A posterior-inferior wing retractor was placed at the junction of the acetabulum and the ischium completing the acetabular exposure.We then removed the peripheral osteophytes and labrum from the acetabulum. We then reamed the acetabulum up to 51 mm with basket  reamers obtaining good coverage in all quadrants, irrigated out with normal  saline solution and hammered into place a 54 mm pinnacle cup in 45  degrees of abduction and about 20 degrees of anteversion. More  peripheral osteophytes removed and a trial 10-degree liner placed with the  index superior-posterior. The hip was then flexed and internally rotated exposing the  proximal femur, which was entered with the initiating reamer followed by  the axial reamers up to a 13.5 mm full depth and 14mm partial depth. We then conically reamed to 18D to the correct depth for a 42 base neck. The calcar was milled to 18Dsm. A trial cone and stem was inserted in the 25 degrees anteversion, with a +0 36mm trial head. Trial reduction was then performed and excellent stability was noted with at 90 of flexion with 75 of internal rotation and then full extension with maximal external rotation. The hip could not be dislocated in full extension. The knee could easily flex  to about 130 degrees. We also stretched the abductors at this point,  because of the preexisting adductor contractures. All trial components  were then removed. The acetabulum was irrigated out with normal saline  solution. A titanium [facility] was then screwed into place  followed by a 10-degree polyethylene liner index superior-posterior. On  the femoral side a 18Dsm ZTT1 cone was hammered into place, followed by a [id number] SROM stem in 25 degrees of anteversion. At this point, a +0 36 mm ceramic head was  hammered on the stem. The hip was reduced. We checked our stability  one more time and found to be excellent. The wound was once again  thoroughly irrigated out with normal saline solution pulse lavage. The  capsular flap and short external rotators were repaired back to the  intertrochanteric crest through drill holes with a #2 Ethibond suture.  The IT band was closed with running 1 Vicryl suture. The subcutaneous  tissue with  0 and 2-0 undyed Vicryl suture and the skin with running  interlocking 3-0 nylon suture. Dressing of Xeroform and Mepilex was  then applied. The patient was then unclamped, rolled supine, awaken extubated and taken to recovery room without difficulty in stable condition.   Pamela Dodson 12/16/2010, 1:06 PM

## 2010-12-17 ENCOUNTER — Encounter (HOSPITAL_COMMUNITY): Payer: Self-pay | Admitting: Orthopedic Surgery

## 2010-12-17 LAB — CBC
Hemoglobin: 10.1 g/dL — ABNORMAL LOW (ref 12.0–15.0)
MCH: 30.3 pg (ref 26.0–34.0)
MCV: 92.8 fL (ref 78.0–100.0)
RBC: 3.33 MIL/uL — ABNORMAL LOW (ref 3.87–5.11)

## 2010-12-17 LAB — BASIC METABOLIC PANEL
CO2: 26 mEq/L (ref 19–32)
Calcium: 8.3 mg/dL — ABNORMAL LOW (ref 8.4–10.5)
Chloride: 101 mEq/L (ref 96–112)
Creatinine, Ser: 0.6 mg/dL (ref 0.50–1.10)
Glucose, Bld: 139 mg/dL — ABNORMAL HIGH (ref 70–99)

## 2010-12-17 MED ORDER — WARFARIN SODIUM 5 MG PO TABS
5.0000 mg | ORAL_TABLET | Freq: Once | ORAL | Status: AC
  Administered 2010-12-17: 5 mg via ORAL
  Filled 2010-12-17: qty 1

## 2010-12-17 NOTE — Progress Notes (Signed)
Patient ID: Pamela Dodson, female   DOB: [birth year], 60 y.o.   MRN: [id number] PATIENT ID: Pamela Dodson  MRN: [id number]  DOB/AGE:  05-06-1950 / 60 y.o.  1 Day Post-Op Procedure(s) (LRB): TOTAL HIP ARTHROPLASTY (Left)    PROGRESS NOTE Subjective: Patient is alert, oriented,noNausea, no Vomiting, no passing gas, no Bowel Movement. Taking PO well. Denies SOB, Chest or Calf Pain. Using Incentive Spirometer, PAS in place. Ambulate today WBAT Patient reports pain as 3 on 0-10 scale  .    Objective: Vital signs in last 24 hours: Filed Vitals:   12/16/10 1530 12/16/10 1609 12/16/10 2106 12/17/10 0615  BP:   143/77 126/66  Pulse:   111 110  Temp: 99 F (37.2 C)  99.5 F (37.5 C) 100.2 F (37.9 C)  TempSrc:      Resp:   20 20  Height:  5\' 3"  (1.6 m)    Weight:  85.73 kg (189 lb)    SpO2:   100% 95%      Intake/Output from previous day: I/O last 3 completed shifts: In: 1400 [I.V.:1400] Out: 550 [Urine:250; Blood:300]   Intake/Output this shift: Total I/O In: 795 [P.O.:120; I.V.:675] Out: 2000 [Urine:2000]   LABORATORY DATA:  Basename 12/17/10 0610  WBC 8.0  HGB 10.1*  HCT 30.9*  PLT 214  NA --  K --  CL --  CO2 --  BUN --  CREATININE --  GLUCOSE --  GLUCAP --  INR 1.13  CALCIUM --    Examination: Neurologically intact ABD soft Neurovascular intact Sensation intact distally Intact pulses distally Dorsiflexion/Plantar flexion intact Incision: dressing C/D/I No cellulitis present Compartment soft} XR AP&Lat of hip shows well placed\fixed THA  Assessment:   1 Day Post-Op Procedure(s) (LRB): TOTAL HIP ARTHROPLASTY (Left)  Plan: PT/OT WBAT, THA  posterior precautions Incentive spirometry for low fever DVT Prophylaxis: Lovenox\Coumadin bridge, monitor INR 1.5-2.0 target  DISCHARGE PLAN: Home  DISCHARGE NEEDS: HHPT, HHRN, Walker and 3-in-1 comode seat

## 2010-12-17 NOTE — Progress Notes (Signed)
ANTICOAGULATION CONSULT NOTE - Follow Up Consult  Pharmacy Consult for Coumadin Indication: VTE prophylaxis s/p L THA  No Known Allergies  Patient Measurements: Height: 5\' 3"  (160 cm) Weight: 189 lb (85.73 kg) IBW/kg (Calculated) : 52.4   Vital Signs: Temp: 100.2 F (37.9 C) (12/13 0615) BP: 126/66 mmHg (12/13 0615) Pulse Rate: 110  (12/13 0615)  Labs:  Basename 12/17/10 0610  HGB 10.1*  HCT 30.9*  PLT 214  APTT --  LABPROT 14.7  INR 1.13  HEPARINUNFRC --  CREATININE 0.60  CKTOTAL --  CKMB --  TROPONINI --   Estimated Creatinine Clearance: 76.6 ml/min (by C-G formula based on Cr of 0.6).   Assessment: Pt with subtherapeutic INR past first dose of coumadin yesterday. No bleeding noted.  Goal of Therapy:  INR 2-3   Plan:  1. Coumadin 5mg  po today 2. F/u INR and for s/s of bleeding  Lavonia Dana 12/17/2010,10:03 AM

## 2010-12-17 NOTE — Progress Notes (Signed)
Physical Therapy Evaluation Patient Details Name: Pamela Dodson MRN: [id number] DOB: 1950/12/19 Today's Date: 12/17/2010  Problem List:  Patient Active Problem List  Diagnoses  . Arthritis of hip    Past Medical History:  Past Medical History  Diagnosis Date  . PONV (postoperative nausea and vomiting)     usually needs zofran/antiemetic prior to surgery  . Hypertension   . Hypothyroidism   . Cancer     hx thyroid cancer  . Blood transfusion   . GERD (gastroesophageal reflux disease)   . Arthritis    Past Surgical History:  Past Surgical History  Procedure Date  . Thyroidectomy 2009  . Colon surgery     diverticulitic mass removed  . Total knee arthroplasty 2009  . Appendectomy   . Cholecystectomy   . Cesarean section   . Ganglion cyst excision     L wrist  . Knee arthroscopy     PT Assessment/Plan/Recommendation PT Assessment Clinical Impression Statement: Pt presents with a medical diagnosis of Left THA along with the following impairments/deficits and therapy diagnosis listed below. Pt will benefit from skilled PT in the acute care setting in order to maximize functional mobility for a safe d/c home. PT Recommendation/Assessment: Patient will need skilled PT in the acute care venue PT Problem List: Decreased strength;Decreased range of motion;Decreased activity tolerance;Decreased mobility;Decreased knowledge of use of DME;Decreased knowledge of precautions;Pain PT Therapy Diagnosis : Difficulty walking;Acute pain PT Plan PT Frequency: 7X/week PT Treatment/Interventions: DME instruction;Gait training;Stair training;Therapeutic activities;Functional mobility training;Therapeutic exercise;Patient/family education PT Recommendation Follow Up Recommendations: Home health PT;24 hour supervision/assistance Equipment Recommended: None recommended by PT PT Goals  Acute Rehab PT Goals PT Goal Formulation: With patient Time For Goal Achievement: 7 days Pt will go  Supine/Side to Sit: with supervision PT Goal: Supine/Side to Sit - Progress: Progressing toward goal Pt will go Sit to Supine/Side: with supervision PT Goal: Sit to Supine/Side - Progress: Progressing toward goal Pt will go Sit to Stand: with modified independence PT Goal: Sit to Stand - Progress: Progressing toward goal Pt will go Stand to Sit: with modified independence PT Goal: Stand to Sit - Progress: Progressing toward goal Pt will Transfer Bed to Chair/Chair to Bed: with supervision PT Transfer Goal: Bed to Chair/Chair to Bed - Progress: Progressing toward goal Pt will Ambulate: >150 feet;with supervision;with rolling walker PT Goal: Ambulate - Progress: Progressing toward goal Pt will Go Up / Down Stairs: 3-5 stairs;with min assist;with rolling walker PT Goal: Up/Down Stairs - Progress: Other (comment) (Unassessed today) Pt will Perform Home Exercise Program: Independently PT Goal: Perform Home Exercise Program - Progress: Progressing toward goal  PT Evaluation Precautions/Restrictions    Prior Functioning  Home Living Lives With: Spouse Receives Help From: Family Type of Home: House Home Layout: Two level;Able to live on main level with bedroom/bathroom Alternate Level Stairs-Rails: Left Alternate Level Stairs-Number of Steps: 13 Home Access: Stairs to enter Entrance Stairs-Rails: None Entrance Stairs-Number of Steps: 3 Bathroom Shower/Tub: Engineer, manufacturing systems: Standard Bathroom Accessibility: Yes How Accessible: Accessible via walker Home Adaptive Equipment: Bedside commode/3-in-1;Walker - rolling;Straight cane Prior Function Level of Independence: Independent with basic ADLs;Independent with gait;Independent with transfers;Independent with homemaking with ambulation Able to Take Stairs?: Yes Driving: Yes Vocation: Full time employment Cognition Cognition Arousal/Alertness: Awake/alert Overall Cognitive Status: Appears within functional limits for  tasks assessed Orientation Level: Oriented X4 Sensation/Coordination Sensation Light Touch: Appears Intact Extremity Assessment RLE Assessment RLE Assessment: Within Functional Limits LLE Assessment LLE Assessment: Exceptions to [facility]  LLE AROM (degrees) Overall AROM Left Lower Extremity: Deficits;Due to precautions;Due to pain (Knee and Ankle WFL) LLE Strength LLE Overall Strength: Deficits;Due to precautions;Due to pain (Knee and Ankle WFL; Pt able to SLR with min assist) Mobility (including Balance) Bed Mobility Bed Mobility: Yes Supine to Sit: 3: Mod assist;HOB elevated (Comment degrees);With rails (35) Supine to Sit Details (indicate cue type and reason): VC for proper sequencing to maintain hip precautions. Assist of LLE and minimal trunk control Sitting - Scoot to Edge of Bed: 4: Min assist;With rail Sitting - Scoot to [place] of Bed Details (indicate cue type and reason): VC for proper hand placement and for weight shifting Transfers Transfers: Yes Sit to Stand: With upper extremity assist;From bed;3: Mod assist Sit to Stand Details (indicate cue type and reason): Assist given for stability and support into standing. VC for hand placement for safety with RW Stand to Sit: 4: Min assist;With upper extremity assist;To chair/3-in-1 Stand to Sit Details: VC for hand and leg placement to maintain precautions Ambulation/Gait Ambulation/Gait: Yes Ambulation/Gait Assistance: 4: Min assist Ambulation/Gait Assistance Details (indicate cue type and reason): VC for proper gait sequencing and placement of LEs during turning for hip precaution safety.  Ambulation Distance (Feet): 50 Feet Assistive device: Rolling walker Gait Pattern: Step-through pattern;Decreased hip/knee flexion - left;Decreased weight shift to left;Trunk flexed Gait velocity: Decreased gait speed Stairs: No    Exercise  Total Joint Exercises Ankle Circles/Pumps: AROM;Both;10 reps;Supine Quad Sets:  AROM;Strengthening;Left;10 reps;Supine Hip ABduction/ADduction: Strengthening;AAROM;Left;10 reps;Supine Straight Leg Raises: AAROM;Strengthening;Left;10 reps;Supine End of Session PT - End of Session Equipment Utilized During Treatment: Gait belt Activity Tolerance: Patient tolerated treatment well Patient left: in chair;with call bell in reach Nurse Communication: Mobility status for transfers;Mobility status for ambulation General Behavior During Session: [facility] for tasks performed Cognition: [facility] for tasks performed  Milana Kidney 12/17/2010, 11:24 AM  12/17/2010 Milana Kidney DPT PAGER: [phone] OFFICE: [phone]

## 2010-12-18 LAB — CBC
HCT: 27.2 % — ABNORMAL LOW (ref 36.0–46.0)
Hemoglobin: 8.9 g/dL — ABNORMAL LOW (ref 12.0–15.0)
MCH: 30.4 pg (ref 26.0–34.0)
MCHC: 32.7 g/dL (ref 30.0–36.0)
RBC: 2.93 MIL/uL — ABNORMAL LOW (ref 3.87–5.11)

## 2010-12-18 LAB — PROTIME-INR: INR: 1.5 — ABNORMAL HIGH (ref 0.00–1.49)

## 2010-12-18 MED ORDER — SODIUM CHLORIDE 0.9 % IV BOLUS (SEPSIS)
500.0000 mL | Freq: Once | INTRAVENOUS | Status: AC
  Administered 2010-12-18: 500 mL via INTRAVENOUS

## 2010-12-18 MED ORDER — METHOCARBAMOL 500 MG PO TABS
500.0000 mg | ORAL_TABLET | Freq: Four times a day (QID) | ORAL | Status: DC | PRN
  Administered 2010-12-18 – 2010-12-19 (×2): 500 mg via ORAL
  Filled 2010-12-18 (×2): qty 1

## 2010-12-18 MED ORDER — WARFARIN SODIUM 2.5 MG PO TABS
2.5000 mg | ORAL_TABLET | Freq: Once | ORAL | Status: AC
  Administered 2010-12-18: 2.5 mg via ORAL
  Filled 2010-12-18: qty 1

## 2010-12-18 MED ORDER — BENAZEPRIL HCL 20 MG PO TABS
20.0000 mg | ORAL_TABLET | Freq: Every day | ORAL | Status: DC
  Administered 2010-12-19: 20 mg via ORAL
  Filled 2010-12-18: qty 1

## 2010-12-18 MED ORDER — AMLODIPINE BESYLATE 10 MG PO TABS
10.0000 mg | ORAL_TABLET | Freq: Every day | ORAL | Status: DC
  Administered 2010-12-19: 10 mg via ORAL
  Filled 2010-12-18: qty 1

## 2010-12-18 NOTE — Progress Notes (Signed)
ANTICOAGULATION CONSULT NOTE - Follow Up Consult  Pharmacy Consult for Coumadin with Lovenox bridge Indication: VTE prophylaxis following L-THA  No Known Allergies  Patient Measurements: Height: 5\' 3"  (160 cm) Weight: 189 lb (85.73 kg) IBW/kg (Calculated) : 52.4    Vital Signs: Temp: 98.5 F (36.9 C) (12/14 0617) BP: 101/51 mmHg (12/14 0617) Pulse Rate: 78  (12/14 0617)  Labs:  Basename 12/18/10 0630 12/17/10 0610  HGB 8.9* 10.1*  HCT 27.2* 30.9*  PLT 195 214  APTT -- --  LABPROT 18.4* 14.7  INR 1.50* 1.13  HEPARINUNFRC -- --  CREATININE -- 0.60  CKTOTAL -- --  CKMB -- --  TROPONINI -- --   Estimated Creatinine Clearance: 76.6 ml/min (by C-G formula based on Cr of 0.6).   Medications:  Scheduled:    . amLODipine  10 mg Oral Daily  . benazepril  20 mg Oral Daily  . coumadin book   Does not apply Once  . enoxaparin  40 mg Subcutaneous Q24H  . levothyroxine  200 mcg Oral Daily  . olmesartan  20 mg Oral Daily  . pantoprazole  40 mg Oral Q1200  . simvastatin  20 mg Oral q1800  . warfarin  5 mg Oral ONCE-1800  . warfarin   Does not apply Once    Assessment: INR 1.5 today.  Noted on PA's note this AM, new stated INR range 1.5 - 2.  Goal of Therapy:  Per Ortho note, INR goal 1.5 - 1   Plan:  Lovenox one more day. Coumadin 2.5mg  today.  Jakaila Norment, Elisha Headland, Pharm.D. 12/18/2010 11:02 AM

## 2010-12-18 NOTE — Progress Notes (Signed)
PATIENT ID: Pamela Dodson  MRN: [id number]  DOB/AGE:  Jan 13, 1950 / 60 y.o.  2 Days Post-Op Procedure(s) (LRB): TOTAL HIP ARTHROPLASTY (Left)    PROGRESS NOTE Subjective: Patient is alert, oriented,noNausea, no Vomiting, yes passing gas, no Bowel Movement. Taking PO well. Denies SOB, Chest or Calf Pain. Using Incentive Spirometer, PAS in place. Ambulating with PT. Patient reports pain as moderate  .    Objective: Vital signs in last 24 hours: Filed Vitals:   12/17/10 2142 12/17/10 2230 12/18/10 0010 12/18/10 0617  BP: 94/53   101/51  Pulse: 110 89  78  Temp: 100.9 F (38.3 C) 101.3 F (38.5 C) 101.7 F (38.7 C) 98.5 F (36.9 C)  TempSrc:      Resp: 18 18  18   Height:      Weight:      SpO2: 98% 98%  98%      Intake/Output from previous day: I/O last 3 completed shifts: In: 795 [P.O.:120; I.V.:675] Out: 2000 [Urine:2000]   Intake/Output this shift:     LABORATORY DATA:  Basename 12/18/10 0630 12/17/10 0610  WBC 8.6 8.0  HGB 8.9* 10.1*  HCT 27.2* 30.9*  PLT 195 214  NA -- 136  K -- 3.6  CL -- 101  CO2 -- 26  BUN -- 8  CREATININE -- 0.60  GLUCOSE -- 139*  GLUCAP -- --  INR 1.50* 1.13  CALCIUM -- 8.3*    Examination: Neurologically intact ABD soft Neurovascular intact Sensation intact distally Incision: scant drainage} XR AP&Lat of hip shows well placed\fixed THA  Assessment:   2 Days Post-Op Procedure(s) (LRB): TOTAL HIP ARTHROPLASTY (Left) ADDITIONAL DIAGNOSIS:  none  Plan: PT/OT WBAT, THA  posterior precautions  DVT Prophylaxis: Lovenox\Coumadin bridge, monitor INR 1.5-2.0 target  DISCHARGE PLAN: Home Saturday  DISCHARGE NEEDS: HHPT, HHRN, Walker and 3-in-1 comode seat

## 2010-12-18 NOTE — Progress Notes (Signed)
Second 500 cc normal saline IV bolus completed. Pt BP 110/62. Pt resting asymptomatic in bed. Will continue to monitor.

## 2010-12-18 NOTE — Progress Notes (Signed)
First 500 cc normal saline IV bolus completed. Pt remains asymptomatic lying in bed. Pt BP now 98/62. Will start second 500 cc normal saline IV bolus per order. Will continue to monitor.

## 2010-12-18 NOTE — Progress Notes (Signed)
OT Cancellation Note  Treatment cancelled today due to patient's refusal to participate: Pt c/o back pain and RN Beth aware. Pt provided pain medication and ice for painful area. OT to continue to follow acutely.   Pamela Dodson   OTR/L Pager: [phone] Office: [phone] .

## 2010-12-18 NOTE — Progress Notes (Signed)
Pt is s/p L THR. Hip precautions. +cms. Pt has a dry mepilex to L hip. Pt is to be WBAT with RW and one assist. Pt is voiding but pt repts that she has only voided twice since surgery. Pt repts LBM 12/10 and pt and as well as night shift repts that she has had some intermittent nausea with vomiting. Pt did receive Reglan PO per order at shift change via night nurse following vomiting. Upon assessment, pt repts that nausea is "better to gone". Abdomen is soft flat nontender and nondistended. Pt repts passing gas. BS hypoactive x 4. Pt lungs CTA and pt performs IS per order. No s/sx cardiac distress or resp distress and no c/o such. Heart rate regular rate and rhythm. Pt noted to have a small amount of edema of LLE that is nonpitting.

## 2010-12-18 NOTE — Progress Notes (Signed)
Physical Therapy Treatment Patient Details Name: Pamela Dodson MRN: [id number] DOB: 1950/04/24 Today's Date: 12/18/2010  PT Assessment/Plan  PT - Assessment/Plan Comments on Treatment Session: Pt. continuing to progress with therapy. Will do stair education with husband next session PT Plan: Discharge plan remains appropriate PT Frequency: 7X/week Follow Up Recommendations: Home health PT Equipment Recommended: None recommended by PT PT Goals  Acute Rehab PT Goals PT Goal: Sit to Stand - Progress: Progressing toward goal PT Goal: Stand to Sit - Progress: Progressing toward goal PT Transfer Goal: Bed to Chair/Chair to Bed - Progress: Progressing toward goal PT Goal: Ambulate - Progress: Progressing toward goal PT Goal: Up/Down Stairs - Progress: Progressing toward goal PT Goal: Perform Home Exercise Program - Progress: Progressing toward goal  PT Treatment Precautions/Restrictions  Precautions Precautions: Posterior Hip Precaution Booklet Issued: Yes (comment) Precaution Comments: Pt re-educated on 3/3 post hip precautions as she was unable to recall. Restrictions Weight Bearing Restrictions: Yes LLE Weight Bearing: Weight bearing as tolerated Mobility (including Balance) Bed Mobility Bed Mobility: No Transfers Transfers: Yes Sit to Stand: From chair/3-in-1;With armrests;With upper extremity assist;Other (comment) (MinGuard A) Sit to Stand Details (indicate cue type and reason): Cues for safe technique and hand placement Stand to Sit: With armrests;Without upper extremity assist;To chair/3-in-1;Other (comment) (MinGuard A) Stand to Sit Details: Cues to slide out LLE and safe hand placement. Cues to control descent into chair Ambulation/Gait Ambulation/Gait: Yes Ambulation/Gait Assistance: Other (comment) (MinGuard A) Ambulation/Gait Assistance Details (indicate cue type and reason): Cues for step through gait and posture within RW Ambulation Distance (Feet): 300  Feet Assistive device: Rolling walker Gait Pattern: Step-through pattern;Decreased stride length;Antalgic Stairs: Yes Stairs Assistance: 4: Min assist Stairs Assistance Details (indicate cue type and reason): A with RW. Cues for technique. Practiced twice Stair Management Technique: No rails;Backwards;With walker Number of Stairs: 2     Exercise  Total Joint Exercises Ankle Circles/Pumps: AROM;Both;10 reps Quad Sets: AROM;Both;10 reps Gluteal Sets: AROM;Both;10 reps Heel Slides: AAROM;Left;10 reps Hip ABduction/ADduction: AAROM;Left;10 reps End of Session PT - End of Session Equipment Utilized During Treatment: Gait belt Activity Tolerance: Patient tolerated treatment well Patient left: in chair;with call bell in reach Nurse Communication: Mobility status for transfers;Mobility status for ambulation General Behavior During Session: [facility] for tasks performed Cognition: [facility] for tasks performed  Chaniya Genter, Adline Potter 12/18/2010, 12:17 PM 12/18/2010 Fredrich Birks PTA [phone] pager [phone] office

## 2010-12-18 NOTE — Progress Notes (Signed)
Pt having nausea w/ vomiting x1. Bowel sounds present x4. Treated with reglan po Pt also c/o pain in her right lateral back. Area slightly swollen and tender to touch. Pain disappears when she is standing.

## 2010-12-18 NOTE — Progress Notes (Addendum)
Rept to [employer] PA. Pt BP manual 78/42. Pulse 99. Pt completely asymptomatic. Pt, prior checking BP, had just walked with PT to gym and back without incidence. Pt repts that she has only voided twice since surgery.  Orders received. Will start 500 cc normal saline bolus IV after obtaining IV site and will hold Benicar, Norvasc, and Benzapril today all per Jennifer's order. After completing IV 500 cc normal saline bolus, if BP remains systolic less than 100 and diastolic less than 60-will repeat another 500 cc normal saline IV bolus per her order. Will continue to monitor.

## 2010-12-19 LAB — PROTIME-INR: INR: 1.96 — ABNORMAL HIGH (ref 0.00–1.49)

## 2010-12-19 LAB — CBC
HCT: 24.4 % — ABNORMAL LOW (ref 36.0–46.0)
Hemoglobin: 8 g/dL — ABNORMAL LOW (ref 12.0–15.0)
MCHC: 32.8 g/dL (ref 30.0–36.0)
RBC: 2.6 MIL/uL — ABNORMAL LOW (ref 3.87–5.11)

## 2010-12-19 MED ORDER — POLYETHYLENE GLYCOL 3350 17 G PO PACK
17.0000 g | PACK | Freq: Every day | ORAL | Status: DC
  Administered 2010-12-19: 17 g via ORAL
  Filled 2010-12-19: qty 1

## 2010-12-19 MED ORDER — PROMETHAZINE HCL 12.5 MG PO TABS: 12.5000 mg | ORAL_TABLET | Freq: Four times a day (QID) | ORAL | Status: AC | PRN

## 2010-12-19 MED ORDER — OXYCODONE-ACETAMINOPHEN 5-325 MG PO TABS: 1.0000 | ORAL_TABLET | ORAL | Status: AC | PRN

## 2010-12-19 MED ORDER — WARFARIN SODIUM 5 MG PO TABS: 5.0000 mg | ORAL_TABLET | Freq: Every day | ORAL | Status: DC

## 2010-12-19 MED ORDER — BISACODYL 10 MG RE SUPP: 10.0000 mg | Freq: Every day | RECTAL | Status: DC | PRN

## 2010-12-19 NOTE — Discharge Summary (Signed)
Patient ID: MERCIA DOWE MRN: [id number] DOB/AGE: [birth year] 60 y.o.  Admit date: 12/16/2010 Discharge date: 12/19/2010  Admission Diagnoses:  Principal Problem:  *Arthritis of hip   Discharge Diagnoses:  Same  Past Medical History  Diagnosis Date  . PONV (postoperative nausea and vomiting)     usually needs zofran/antiemetic prior to surgery  . Hypertension   . Hypothyroidism   . Cancer     hx thyroid cancer  . Blood transfusion   . GERD (gastroesophageal reflux disease)   . Arthritis     Surgeries: Procedure(s): TOTAL HIP ARTHROPLASTY on 12/16/2010   Consultants:    Discharged Condition: Improved  Hospital Course: LOTTA FRANKENFIELD is an 60 y.o. female who was admitted 12/16/2010 for operative treatment ofArthritis of hip. Patient has severe unremitting pain that affects sleep, daily activities, and work/hobbies. After pre-op clearance the patient was taken to the operating room on 12/16/2010 and underwent  Procedure(s): TOTAL HIP ARTHROPLASTY.    Patient was given perioperative antibiotics: Anti-infectives     Start     Dose/Rate Route Frequency Ordered Stop   12/16/10 1211   cefUROXime (ZINACEF) injection  Status:  Discontinued          As needed 12/16/10 1211 12/16/10 1329           Patient was given sequential compression devices, early ambulation, and chemoprophylaxis to prevent DVT.  Patient benefited maximally from hospital stay and there were no complications.    Recent vital signs: Patient Vitals for the past 24 hrs:  BP Temp Pulse Resp SpO2  12/19/10 0620 108/52 mmHg 99.6 F (37.6 C) 103  16  97 %  01-04-2011 2250 124/50 mmHg 100.7 F (38.2 C) 97  16  95 %  2011-01-04 1500 118/68 mmHg 98.6 F (37 C) 109  20  100 %  2011-01-04 1400 110/62 mmHg - - - -  01/04/11 1300 98/62 mmHg - - - -  2011/01/04 1040 78/42 mmHg - 99  - -     Recent laboratory studies:  Basename 12/19/10 0500 2011/01/04 0630 12/17/10 0610  WBC 6.6 8.6 --  HGB 8.0* 8.9* --  HCT  24.4* 27.2* --  PLT 187 195 --  NA -- -- 136  K -- -- 3.6  CL -- -- 101  CO2 -- -- 26  BUN -- -- 8  CREATININE -- -- 0.60  GLUCOSE -- -- 139*  INR 1.96* 1.50* --  CALCIUM -- -- 8.3*     Discharge Medications:  Current Discharge Medication List    START taking these medications   Details  oxyCODONE-acetaminophen (ROXICET) 5-325 MG per tablet Take 1-2 tablets by mouth every 4 (four) hours as needed for pain. Qty: 60 tablet, Refills: 0    promethazine (PHENERGAN) 12.5 MG tablet Take 1 tablet (12.5 mg total) by mouth every 6 (six) hours as needed for nausea. Qty: 60 tablet, Refills: 0    warfarin (COUMADIN) 5 MG tablet Take 1 tablet (5 mg total) by mouth daily. Qty: 30 tablet, Refills: 0      CONTINUE these medications which have NOT CHANGED   Details  amLODipine-benazepril (LOTREL) 10-20 MG per capsule Take 1 capsule by mouth daily.      atorvastatin (LIPITOR) 20 MG tablet Take 20 mg by mouth daily.      Black Cohosh (REMIFEMIN PO) Take 1 tablet by mouth 2 (two) times daily. Over the counter medication. For hot flashes.     Camphor-Menthol-Methyl Sal (SALONPAS) 1.2-5.7-6.3 % PTCH Apply  1 patch topically daily as needed. As needed for pain.     levothyroxine (SYNTHROID, LEVOTHROID) 200 MCG tablet Take 200 mcg by mouth daily.      olmesartan (BENICAR) 20 MG tablet Take 20 mg by mouth daily.      omeprazole (PRILOSEC) 20 MG capsule Take 20 mg by mouth daily.     OVER THE COUNTER MEDICATION 1 tablet daily. Glucosamine OTC.    trolamine salicylate (ASPERCREME) 10 % cream Apply 1 application topically 2 (two) times daily as needed. As needed for pain relief.       STOP taking these medications     acetaminophen (TYLENOL) 500 MG tablet      indomethacin (INDOCIN SR) 75 MG CR capsule      traMADol (ULTRAM) 50 MG tablet         Diagnostic Studies: Dg Chest 2 View  12/04/2010  *RADIOLOGY REPORT*  Clinical Data: Preop.  CHEST - 2 VIEW  Comparison: None.  Findings:  Trachea is midline.  Heart is at the upper limits of normal in size, to mildly enlarged.  Lungs are clear.  No pleural fluid.  Degenerative changes are seen in the spine.  IMPRESSION: No acute findings.  Original Report Authenticated By: Reyes Ivan, M.D.   Dg Pelvis Portable  12/16/2010  *RADIOLOGY REPORT*  Clinical Data: Postop left hip.  PORTABLE PELVIS  Comparison: None.  Findings: Post total left hip replacement which appears in satisfactory position on this single projection.  Moderate right hip joint degenerative changes.  IMPRESSION: Post left hip replacement which appears in satisfactory position on this single projection.  Moderate right hip joint degenerative changes.  Original Report Authenticated By: Fuller Canada, M.D.   Dg Hip Portable 1 View Left  12/16/2010  *RADIOLOGY REPORT*  Clinical Data: Postop left hip replacement.  PORTABLE LEFT HIP - 1 VIEW  Comparison: None.  Findings: Cross-table lateral view of the left hip was obtained at 1741 hours.  The femoral component appears located within the acetabular cup on this single image.  IMPRESSION: No evidence for immediate complications.  Original Report Authenticated By: ERIC A. MANSELL, M.D.    Disposition: Final discharge disposition not confirmed  Discharge Orders    Future Orders Please Complete By Expires   Diet - low sodium heart healthy      Increase activity slowly      Walker       May shower / Bathe      Driving Restrictions      Comments:   No driving for 2 weeks.   Change dressing (specify)      Comments:   Dressing change as needed.   Call MD for:  temperature >100.4      Call MD for:  severe uncontrolled pain      Call MD for:  redness, tenderness, or signs of infection (pain, swelling, redness, odor or green/yellow discharge around incision site)      Discharge instructions      Comments:   F/U with Dr. Turner Daniels in 10 days         Signed: Hazle Nordmann. 12/19/2010, 8:01 AM

## 2010-12-19 NOTE — Progress Notes (Signed)
PATIENT ID: Pamela Dodson  MRN: [id number]  DOB/AGE:  61-Jun-1952 / 61 y.o.  3 Days Post-Op Procedure(s) (LRB): TOTAL HIP ARTHROPLASTY (Left)    PROGRESS NOTE Subjective: Patient is alert, oriented, mild Nausea, no Vomiting, yes passing gas, no Bowel Movement. Taking PO well. Denies SOB, Chest or Calf Pain. Using Incentive Spirometer, PAS in place. Ambulating well. Patient reports pain as moderate  .    Objective: Vital signs in last 24 hours: Filed Vitals:   12/18/10 1400 12/18/10 1500 12/18/10 2250 12/19/10 0620  BP: 110/62 118/68 124/50 108/52  Pulse:  109 97 103  Temp:  98.6 F (37 C) 100.7 F (38.2 C) 99.6 F (37.6 C)  TempSrc:      Resp:  20 16 16   Height:      Weight:      SpO2:  100% 95% 97%      Intake/Output from previous day: I/O last 3 completed shifts: In: 1730 [P.O.:730; IV Piggyback:1000] Out: -    Intake/Output this shift:     LABORATORY DATA:  Basename 12/19/10 0500 12/18/10 0630 12/17/10 0610  WBC 6.6 8.6 --  HGB 8.0* 8.9* --  HCT 24.4* 27.2* --  PLT 187 195 --  NA -- -- 136  K -- -- 3.6  CL -- -- 101  CO2 -- -- 26  BUN -- -- 8  CREATININE -- -- 0.60  GLUCOSE -- -- 139*  GLUCAP -- -- --  INR 1.96* 1.50* --  CALCIUM -- -- 8.3*    Examination: Neurologically intact ABD soft Neurovascular intact Sensation intact distally Incision: scant drainage} XR AP&Lat of hip shows well placed\fixed THA  Assessment:   3 Days Post-Op Procedure(s) (LRB): TOTAL HIP ARTHROPLASTY (Left) ADDITIONAL DIAGNOSIS:  none  Plan: PT/OT WBAT, THA  posterior precautions  DVT Prophylaxis: Lovenox\Coumadin bridge, monitor INR 1.5-2.0 target  DISCHARGE PLAN: Home today  DISCHARGE NEEDS: HHPT, HHRN, Walker and 3-in-1 comode seat

## 2010-12-19 NOTE — Progress Notes (Signed)
   CARE MANAGEMENT NOTE 12/19/2010  Patient:  GORDON, CARLSON   Account Number:  [id number]  Date Initiated:  12/19/2010  Documentation initiated by:  Tera Mater  Subjective/Objective Assessment:   DX:  Hip surgery     Action/Plan:   discharge plannning   Anticipated DC Date:  12/19/2010   Anticipated DC Plan:  HOME W HOME HEALTH SERVICES      DC Planning Services  CM consult      Choice offered to / List presented to:  C-1 Patient   DME arranged  [facility]      DME agency  [facility]     [facility] arranged  HH-1 RN  HH-2 PT  HH-3 OT      [facility] agency  [facility]   Status of service:  Completed, signed off Medicare Important Message given?  NO (If response is "NO", the following Medicare IM given date fields will be blank) Date Medicare IM given:   Date Additional Medicare IM given:    Discharge Disposition:  HOME W HOME HEALTH SERVICES  Per UR Regulation:    Comments:  12/19/10 1545--SPOKE WITH PT. ABOUT HOME HEALTH SERVICES AS WELL AS GIVING PT. LIST OF AGENCIES.  PT. CHOSE [facility].  CONTACTED MOLLY WITH [facility] TO GIVE REFERRAL FOR HH RN FOR COUMADIN MANAGEMENT, HH PT/OT, DME [facility] TO BE DELIVERED TO PT. HOME, AND TO MAKE AWARE OF DISCHARGE TODAY.  SOC TO BEGIN WITHIN 24 TO 48HR POST DISCHARGE.  Tera Mater, RN, BSN # [phone]

## 2010-12-19 NOTE — Progress Notes (Signed)
Physical Therapy Treatment Patient Details Name: Pamela Dodson MRN: [id number] DOB: [birth year] Today's Date: 12/19/2010  PT Assessment/Plan  PT - Assessment/Plan Comments on Treatment Session: Pt continuing to do well. Completed stair instruction with husband.  PT Plan: Discharge plan remains appropriate PT Frequency: 7X/week Follow Up Recommendations: Home health PT Equipment Recommended: None recommended by PT PT Goals  Acute Rehab PT Goals PT Goal: Supine/Side to Sit - Progress: Progressing toward goal PT Goal: Sit to Stand - Progress: Progressing toward goal PT Goal: Stand to Sit - Progress: Progressing toward goal PT Goal: Ambulate - Progress: Met PT Goal: Up/Down Stairs - Progress: Met  PT Treatment Precautions/Restrictions  Precautions Precautions: Posterior Hip Precaution Booklet Issued: Yes (comment) Precaution Comments: Pt re-educated on 3/3 post hip precautions as she was unable to recall. Restrictions Weight Bearing Restrictions: Yes LLE Weight Bearing: Weight bearing as tolerated Mobility (including Balance) Bed Mobility Bed Mobility: No Supine to Sit: 4: Min assist;HOB flat Supine to Sit Details (indicate cue type and reason): A with LLE. Cues for positioning.  Sitting - Scoot to Edge of Bed: 6: Modified independent (Device/Increase time) Transfers Sit to Stand: 5: Supervision;From bed;With upper extremity assist Sit to Stand Details (indicate cue type and reason): Cues for technique Stand to Sit: 5: Supervision;To chair/3-in-1;With armrests;With upper extremity assist Stand to Sit Details: Cues to slide out LLE Ambulation/Gait Ambulation/Gait Assistance: 5: Supervision Ambulation/Gait Assistance Details (indicate cue type and reason): Cues to look forward Ambulation Distance (Feet): 230 Feet Assistive device: Rolling walker Gait Pattern: Step-through pattern;Decreased stride length Stairs Assistance: 4: Min assist Stairs Assistance Details (indicate  cue type and reason): A with RW placement. Cues for technique Stair Management Technique: No rails;With walker;Backwards Number of Stairs: 2     Exercise    End of Session PT - End of Session Equipment Utilized During Treatment: Gait belt Activity Tolerance: Patient tolerated treatment well Patient left: Other (comment) (On 3n1 with OT) General Behavior During Session: [facility] for tasks performed Cognition: [facility] for tasks performed  Allah Reason, Adline Potter 12/19/2010, 10:35 AM  12/19/2010 Fredrich Birks PTA [phone] pager [phone] office

## 2010-12-19 NOTE — Progress Notes (Signed)
Occupational Therapy Evaluation Patient Details Name: Pamela Dodson MRN: [id number] DOB: 17-Apr-1950 Today's Date: 12/19/2010  Problem List:  Patient Active Problem List  Diagnoses  . Arthritis of hip    Past Medical History:  Past Medical History  Diagnosis Date  . PONV (postoperative nausea and vomiting)     usually needs zofran/antiemetic prior to surgery  . Hypertension   . Hypothyroidism   . Cancer     hx thyroid cancer  . Blood transfusion   . GERD (gastroesophageal reflux disease)   . Arthritis    Past Surgical History:  Past Surgical History  Procedure Date  . Thyroidectomy 2009  . Colon surgery     diverticulitic mass removed  . Total knee arthroplasty 2009  . Appendectomy   . Cholecystectomy   . Cesarean section   . Ganglion cyst excision     L wrist  . Knee arthroscopy   . Total hip arthroplasty 12/16/2010    Procedure: TOTAL HIP ARTHROPLASTY;  Surgeon: Nestor Lewandowsky;  Location: [facility] OR;  Service: Orthopedics;  Laterality: Left;  Depuy    OT Assessment/Plan/Recommendation OT Assessment Clinical Impression Statement: Pt is a 60 year old woman recovering from L THA.  Pt is knowledgeable in post hip precautions and use of DME and AE for ADL.  Will have husband available to assist with IADL as needed.  All education completed, no further OT needs. OT Recommendation/Assessment: Patient does not need any further OT services OT Recommendation Equipment Recommended: None recommended by OT  OT Evaluation Precautions/Restrictions  Precautions Precautions: Posterior Hip (pt able to state 3/3) Precaution Booklet Issued: Yes (comment) Precaution Comments: Pt re-educated on 3/3 post hip precautions as she was unable to recall. Restrictions Weight Bearing Restrictions: Yes LLE Weight Bearing: Weight bearing as tolerated Prior Functioning Home Living Lives With: Spouse Receives Help From: Family Type of Home: House Home Layout: Two level;Able to live on main  level with bedroom/bathroom Alternate Level Stairs-Rails: Left Alternate Level Stairs-Number of Steps: 13 Home Access: Stairs to enter Bathroom Shower/Tub: Engineer, manufacturing systems: Standard Bathroom Accessibility: Yes How Accessible: Accessible via walker Home Adaptive Equipment: Bedside commode/3-in-1;Walker - rolling;Straight cane Prior Function Level of Independence: Independent with basic ADLs;Independent with gait;Independent with transfers;Independent with homemaking with ambulation Driving: Yes Vocation: Full time employment ADL ADL Eating/Feeding: Simulated;Independent Where Assessed - Eating/Feeding: Chair Grooming: Performed;Wash/dry hands;Supervision/safety Where Assessed - Grooming: Standing at sink Upper Body Bathing: Simulated;Set up Where Assessed - Upper Body Bathing: Sitting, chair Lower Body Bathing: Supervision/safety;Simulated Lower Body Bathing Details (indicate cue type and reason): pt has a long handled sponge at home, instructed to use reacher to dry feet Where Assessed - Lower Body Bathing: Sit to stand from chair;Sitting, chair Upper Body Dressing: Simulated;Set up Where Assessed - Upper Body Dressing: Sitting, chair Lower Body Dressing: Performed;Supervision/safety Lower Body Dressing Details (indicate cue type and reason): issued sock aide and practiced donning and doffing with AE Where Assessed - Lower Body Dressing: Sit to stand from chair;Sitting, chair Toilet Transfer: Performed;Supervision/safety Toilet Transfer Details (indicate cue type and reason): 3 in1 over toilet Toilet Transfer Method: Ambulating Toilet Transfer Equipment: Raised toilet seat with arms (or 3-in-1 over toilet) Toileting - Clothing Manipulation: Performed;Independent Where Assessed - Toileting Clothing Manipulation: Standing Toileting - Hygiene: Performed;Independent Where Assessed - Toileting Hygiene: Sit on 3-in-1 or toilet Equipment Used: Reacher;Sock aid;Rolling  walker ADL Comments: Pt is familiar with use of LB AE from previous B knee replacements. Vision/Perception  Vision - History Baseline Vision:  Wears glasses all the time Cognition Cognition Arousal/Alertness: Awake/alert Overall Cognitive Status: Appears within functional limits for tasks assessed Orientation Level: Oriented X4 Sensation/Coordination Sensation Light Touch: Appears Intact (has sensitivity to cold in L hand--use glove) Hot/Cold: Appears Intact Proprioception: Appears Intact Coordination Gross Motor Movements are Fluid and Coordinated: Yes Fine Motor Movements are Fluid and Coordinated: Yes Extremity Assessment RUE Assessment RUE Assessment: Within Functional Limits LUE Assessment LUE Assessment: Within Functional Limits Mobility  Bed Mobility Bed Mobility: No End of Session OT - End of Session Equipment Utilized During Treatment: Gait belt Activity Tolerance: Patient tolerated treatment well Patient left: in chair;with family/visitor present General Behavior During Session: [facility] for tasks performed Cognition: [facility] for tasks performed   Evern Bio 12/19/2010, 9:51 AM  [phone]

## 2011-01-07 ENCOUNTER — Encounter (HOSPITAL_COMMUNITY): Payer: Self-pay | Admitting: Anesthesiology

## 2011-01-07 MED ORDER — CEFAZOLIN SODIUM 1-5 GM-% IV SOLN
INTRAVENOUS | Status: DC | PRN
  Administered 2010-12-16: 1 g via INTRAVENOUS

## 2011-01-07 NOTE — Addendum Note (Signed)
Addendum  created 01/07/11 0743 by Quade Ramirez   Modules edited:Anesthesia Medication Administration    

## 2011-01-07 NOTE — Addendum Note (Signed)
Addendum  created 01/07/11 0743 by Coralee Rud   Modules edited:Anesthesia Medication Administration

## 2011-02-10 ENCOUNTER — Ambulatory Visit: Payer: Self-pay

## 2012-08-24 ENCOUNTER — Encounter: Payer: Self-pay | Admitting: General Surgery

## 2012-09-11 ENCOUNTER — Ambulatory Visit: Payer: Self-pay | Admitting: General Surgery

## 2012-09-14 ENCOUNTER — Encounter: Payer: Self-pay | Admitting: *Deleted

## 2014-01-04 HISTORY — PX: CARPAL TUNNEL RELEASE: SHX101

## 2014-02-14 DIAGNOSIS — M25561 Pain in right knee: Secondary | ICD-10-CM | POA: Diagnosis not present

## 2014-04-09 DIAGNOSIS — N951 Menopausal and female climacteric states: Secondary | ICD-10-CM | POA: Diagnosis not present

## 2014-04-09 DIAGNOSIS — M150 Primary generalized (osteo)arthritis: Secondary | ICD-10-CM | POA: Diagnosis not present

## 2014-04-09 DIAGNOSIS — I10 Essential (primary) hypertension: Secondary | ICD-10-CM | POA: Diagnosis not present

## 2014-04-09 DIAGNOSIS — E780 Pure hypercholesterolemia: Secondary | ICD-10-CM | POA: Diagnosis not present

## 2014-04-09 DIAGNOSIS — E890 Postprocedural hypothyroidism: Secondary | ICD-10-CM | POA: Diagnosis not present

## 2014-05-22 DIAGNOSIS — G5602 Carpal tunnel syndrome, left upper limb: Secondary | ICD-10-CM | POA: Diagnosis not present

## 2014-05-22 DIAGNOSIS — D2112 Benign neoplasm of connective and other soft tissue of left upper limb, including shoulder: Secondary | ICD-10-CM | POA: Diagnosis not present

## 2014-06-04 DIAGNOSIS — G5602 Carpal tunnel syndrome, left upper limb: Secondary | ICD-10-CM | POA: Diagnosis not present

## 2014-06-04 DIAGNOSIS — D2112 Benign neoplasm of connective and other soft tissue of left upper limb, including shoulder: Secondary | ICD-10-CM | POA: Diagnosis not present

## 2014-06-04 DIAGNOSIS — D1809 Hemangioma of other sites: Secondary | ICD-10-CM | POA: Diagnosis not present

## 2014-06-14 ENCOUNTER — Other Ambulatory Visit: Payer: Self-pay

## 2014-06-14 DIAGNOSIS — Z1211 Encounter for screening for malignant neoplasm of colon: Secondary | ICD-10-CM

## 2014-06-14 MED ORDER — NA SULFATE-K SULFATE-MG SULF 17.5-3.13-1.6 GM/177ML PO SOLN: 1.0000 | ORAL | Status: DC

## 2014-06-14 NOTE — Progress Notes (Signed)
Gastroenterology Pre-Procedure Review  Request Date: 08-15-14 Requesting Physician: Dr. Bobetta Lime  PATIENT REVIEW QUESTIONS: The patient responded to the following health history questions as indicated:    1. Are you having any GI issues? no 2. Do you have a personal history of Polyps? no 3. Do you have a family history of Colon Cancer or Polyps? no 4. Diabetes Mellitus? no 5. Joint replacements in the past 12 months?no 6. Major health problems in the past 3 months?no 7. Any artificial heart valves, MVP, or defibrillator?no    MEDICATIONS & ALLERGIES:    Patient reports the following regarding taking any anticoagulation/antiplatelet therapy:   Plavix, Coumadin, Eliquis, Xarelto, Lovenox, Pradaxa, Brilinta, or Effient? no Aspirin? no  Patient confirms/reports the following medications:  Current Outpatient Prescriptions  Medication Sig Dispense Refill  . amLODipine-benazepril (LOTREL) 10-20 MG per capsule Take 1 capsule by mouth daily.      Marland Kitchen atorvastatin (LIPITOR) 20 MG tablet Take 20 mg by mouth daily.      Renard Hamper Cohosh (REMIFEMIN PO) Take 1 tablet by mouth 2 (two) times daily. Over the counter medication. For hot flashes.     . Camphor-Menthol-Methyl Sal (SALONPAS) 1.2-5.7-6.3 % PTCH Apply 1 patch topically daily as needed. As needed for pain.     Marland Kitchen levothyroxine (SYNTHROID, LEVOTHROID) 200 MCG tablet Take 200 mcg by mouth daily.      . Na Sulfate-K Sulfate-Mg Sulf (SUPREP BOWEL PREP) SOLN Take 1 kit by mouth as directed. 1 Bottle 0  . olmesartan (BENICAR) 20 MG tablet Take 20 mg by mouth daily.      Marland Kitchen omeprazole (PRILOSEC) 20 MG capsule Take 20 mg by mouth daily.     Marland Kitchen OVER THE COUNTER MEDICATION 1 tablet daily. Glucosamine OTC.    Marland Kitchen trolamine salicylate (ASPERCREME) 10 % cream Apply 1 application topically 2 (two) times daily as needed. As needed for pain relief.     . warfarin (COUMADIN) 5 MG tablet Take 1 tablet (5 mg total) by mouth daily. 30 tablet 0   No current  facility-administered medications for this visit.    Patient confirms/reports the following allergies:  No Known Allergies  Orders Placed This Encounter  Procedures  . Procedural/ Surgical Case Request: COLONOSCOPY WITH PROPOFOL    Standing Status: Standing     Number of Occurrences: 1     Standing Expiration Date:     Order Specific Question:  Pre-op diagnosis    Answer:  SCREENING Z12.11    Order Specific Question:  CPT Code    Answer:  74734    AUTHORIZATION INFORMATION Primary Insurance: [place] 1D#: Group #:  Secondary Insurance: 1D#: Group #:  SCHEDULE INFORMATION: Date:  Time: Location: [place]

## 2014-06-17 ENCOUNTER — Telehealth: Payer: Self-pay

## 2014-06-17 NOTE — Telephone Encounter (Signed)
-----   Message from Otilio Jefferson sent at 06/10/2014  9:41 AM EDT ----- Regarding: colonoscopy From: Tia Masker Sent: 05/21/2014  triage

## 2014-06-17 NOTE — Telephone Encounter (Signed)
Pt scheduled for a colonoscopy at [facility] on 08-15-14. Instructs/rx mailed.

## 2014-06-19 DIAGNOSIS — D2112 Benign neoplasm of connective and other soft tissue of left upper limb, including shoulder: Secondary | ICD-10-CM | POA: Diagnosis not present

## 2014-08-07 ENCOUNTER — Ambulatory Visit (INDEPENDENT_AMBULATORY_CARE_PROVIDER_SITE_OTHER): Payer: Medicare Other | Admitting: Family Medicine

## 2014-08-07 ENCOUNTER — Encounter (INDEPENDENT_AMBULATORY_CARE_PROVIDER_SITE_OTHER): Payer: Self-pay

## 2014-08-07 ENCOUNTER — Other Ambulatory Visit: Payer: Self-pay | Admitting: Family Medicine

## 2014-08-07 ENCOUNTER — Encounter: Payer: Self-pay | Admitting: Family Medicine

## 2014-08-07 VITALS — BP 118/62 | HR 90 | Temp 98.0°F | Resp 16 | Ht 62.0 in | Wt 209.9 lb

## 2014-08-07 DIAGNOSIS — R609 Edema, unspecified: Secondary | ICD-10-CM | POA: Diagnosis not present

## 2014-08-07 DIAGNOSIS — K219 Gastro-esophageal reflux disease without esophagitis: Secondary | ICD-10-CM | POA: Insufficient documentation

## 2014-08-07 DIAGNOSIS — E669 Obesity, unspecified: Secondary | ICD-10-CM | POA: Insufficient documentation

## 2014-08-07 DIAGNOSIS — Z124 Encounter for screening for malignant neoplasm of cervix: Secondary | ICD-10-CM | POA: Insufficient documentation

## 2014-08-07 DIAGNOSIS — N951 Menopausal and female climacteric states: Secondary | ICD-10-CM

## 2014-08-07 DIAGNOSIS — K439 Ventral hernia without obstruction or gangrene: Secondary | ICD-10-CM | POA: Insufficient documentation

## 2014-08-07 DIAGNOSIS — E890 Postprocedural hypothyroidism: Secondary | ICD-10-CM | POA: Diagnosis not present

## 2014-08-07 DIAGNOSIS — E7800 Pure hypercholesterolemia, unspecified: Secondary | ICD-10-CM | POA: Insufficient documentation

## 2014-08-07 DIAGNOSIS — M159 Polyosteoarthritis, unspecified: Secondary | ICD-10-CM | POA: Insufficient documentation

## 2014-08-07 DIAGNOSIS — E8881 Metabolic syndrome: Secondary | ICD-10-CM | POA: Insufficient documentation

## 2014-08-07 DIAGNOSIS — Z96659 Presence of unspecified artificial knee joint: Secondary | ICD-10-CM | POA: Insufficient documentation

## 2014-08-07 DIAGNOSIS — Z85850 Personal history of malignant neoplasm of thyroid: Secondary | ICD-10-CM | POA: Insufficient documentation

## 2014-08-07 DIAGNOSIS — Z23 Encounter for immunization: Secondary | ICD-10-CM | POA: Diagnosis not present

## 2014-08-07 DIAGNOSIS — Z0000 Encounter for general adult medical examination without abnormal findings: Secondary | ICD-10-CM

## 2014-08-07 DIAGNOSIS — I10 Essential (primary) hypertension: Secondary | ICD-10-CM | POA: Insufficient documentation

## 2014-08-07 DIAGNOSIS — R600 Localized edema: Secondary | ICD-10-CM

## 2014-08-07 DIAGNOSIS — M5116 Intervertebral disc disorders with radiculopathy, lumbar region: Secondary | ICD-10-CM | POA: Insufficient documentation

## 2014-08-07 DIAGNOSIS — H8120 Vestibular neuronitis, unspecified ear: Secondary | ICD-10-CM | POA: Insufficient documentation

## 2014-08-07 DIAGNOSIS — A881 Epidemic vertigo: Secondary | ICD-10-CM | POA: Insufficient documentation

## 2014-08-07 DIAGNOSIS — Z6836 Body mass index (BMI) 36.0-36.9, adult: Secondary | ICD-10-CM | POA: Insufficient documentation

## 2014-08-07 HISTORY — DX: Presence of unspecified artificial knee joint: Z96.659

## 2014-08-07 HISTORY — DX: Personal history of malignant neoplasm of thyroid: Z85.850

## 2014-08-07 HISTORY — DX: Gastro-esophageal reflux disease without esophagitis: K21.9

## 2014-08-07 MED ORDER — GABAPENTIN 300 MG PO CAPS: 300.0000 mg | ORAL_CAPSULE | Freq: Every day | ORAL | Status: DC

## 2014-08-07 NOTE — Progress Notes (Signed)
Name: Pamela Dodson   MRN: [id number]    DOB: 03/03/1950   Date:08/07/2014       Progress Note  Subjective  Chief Complaint  Chief Complaint  Patient presents with  . Annual Exam  . Medication Refill    send rx to place on hold until needed    HPI  Patient is here today for a Complete Female Physical Exam:  The patient has has no unusual complaints. Overall feels healthy. Diet is well balanced. In general does not exercise regularly. Sees dentist regularly and addresses vision concerns with ophthalmologist if applicable. In regards to sexual activity the patient is not currently sexually active. Currently is not concerned about exposure to any STDs. Is currently post menopausal with all female organs intact. Having night time uncomfortable feeling, not necessarily sweating but like hot flashes or hormonal changes. Using Gabapentin 200mg  at night which hasn't shown much change. She has not had a PAP smear test in over 5 years as is wondering if she should have 1 more in her lifetime. She has no complaints of vaginal discharge or bleeding or pelvic pain. She does have dryness of vaginal external area.  .Hypothyroidism: Patient presents for evaluation of thyroid function. Symptoms consist of denies fatigue, weight changes, heat/cold intolerance, bowel/skin changes or CVS symptoms. Symptoms have present for several years. The symptoms are mild.  The problem has been stable.  Previous thyroid studies include TSH, T3 uptake and total T4. The hypothyroidism is due to hypothyroidism and status-post I-131 treatment.  She has a significant history of thyroid cancer with removal of gland and after some years PCP discovered thyroid tissue on routine exam and so she was re-consulted by Endocrinologist and underwent RAI treatment with isolation and had ENT repeat surgical resection.     Active Ambulatory Problems    Diagnosis Date Noted  . Arthritis of hip 12/16/2010  . Hypertension goal BP (blood  pressure) < 150/90 08/07/2014  . Edema extremities 08/07/2014  . Gastro-esophageal reflux disease without esophagitis 08/07/2014  . History of knee replacement 08/07/2014  . H/O malignant neoplasm of thyroid 08/07/2014  . Hot flash, menopausal 08/07/2014  . Lumbar disc disease with radiculopathy 08/07/2014  . Dysmetabolic syndrome 88/89/1694  . Adiposity 08/07/2014  . Hypothyroidism, postop 08/07/2014  . Generalized OA 08/07/2014  . Hypercholesterolemia without hypertriglyceridemia 08/07/2014  . Hernia of anterior abdominal wall 08/07/2014  . Annual physical exam 08/07/2014  . Need for pneumococcal vaccination 08/07/2014  . Vestibular neuronitis 08/07/2014  . Cervical cancer screening 08/07/2014   Resolved Ambulatory Problems    Diagnosis Date Noted  . Acute epidemic vertigo 08/07/2014   Past Medical History  Diagnosis Date  . PONV (postoperative nausea and vomiting)   . Hypertension   . Hypothyroidism   . Cancer   . Blood transfusion   . GERD (gastroesophageal reflux disease)   . Arthritis     Past Surgical History  Procedure Laterality Date  . Thyroidectomy  2009  . Colon surgery      diverticulitic mass removed  . Total knee arthroplasty  2009  . Appendectomy    . Cholecystectomy    . Cesarean section    . Ganglion cyst excision      L wrist  . Knee arthroscopy    . Total hip arthroplasty  12/16/2010    Procedure: TOTAL HIP ARTHROPLASTY;  Surgeon: Kerin Salen;  Location: [place];  Service: Orthopedics;  Laterality: Left;  Depuy  . Joint replacement  Family History  Problem Relation Age of Onset  . Birth defects Son     congenital genetic abnormality    History   Social History  . Marital Status: Married    Spouse Name: N/A  . Number of Children: N/A  . Years of Education: N/A   Occupational History  . Not on file.   Social History Main Topics  . Smoking status: Never Smoker   . Smokeless tobacco: Not on file  . Alcohol Use: 0.0 oz/week     0 Standard drinks or equivalent per week     Comment: occasional  . Drug Use: No  . Sexual Activity:    Partners: Male   Other Topics Concern  . Not on file   Social History Narrative     Current outpatient prescriptions:  .  amLODipine-benazepril (LOTREL) 10-20 MG per capsule, Take 1 capsule by mouth daily.  , Disp: , Rfl:  .  atorvastatin (LIPITOR) 20 MG tablet, Take 20 mg by mouth daily.  , Disp: , Rfl:  .  Bioflavonoid Products (VITAMIN C) CHEW, Chew 1 capsule by mouth daily., Disp: , Rfl:  .  Black Cohosh (REMIFEMIN PO), Take 1 tablet by mouth 2 (two) times daily. Over the counter medication. For hot flashes. , Disp: , Rfl:  .  Camphor-Menthol-Methyl Sal (SALONPAS) 1.2-5.7-6.3 % PTCH, Apply 1 patch topically daily as needed. As needed for pain. , Disp: , Rfl:  .  Cholecalciferol 1000 UNITS capsule, Take 1 capsule by mouth daily., Disp: , Rfl:  .  gabapentin (NEURONTIN) 300 MG capsule, Take 1 capsule (300 mg total) by mouth at bedtime., Disp: 60 capsule, Rfl: 2 .  indomethacin (INDOCIN) 50 MG capsule, Take 1 capsule by mouth 2 (two) times daily., Disp: , Rfl:  .  levothyroxine (SYNTHROID, LEVOTHROID) 200 MCG tablet, Take 200 mcg by mouth daily.  , Disp: , Rfl:  .  Omega-3 Fatty Acids (FISH OIL) 1000 MG CPDR, Take 1 capsule by mouth daily., Disp: , Rfl:  .  omeprazole (PRILOSEC) 20 MG capsule, Take 20 mg by mouth daily. , Disp: , Rfl:  .  trolamine salicylate (ASPERCREME) 10 % cream, Apply 1 application topically 2 (two) times daily as needed. As needed for pain relief. , Disp: , Rfl:   No Known Allergies  ROS  CONSTITUTIONAL: No significant weight changes, fever, chills, weakness or fatigue.  HEENT:  - Eyes: No visual changes.  - Ears: No auditory changes. No pain.  - Nose: No sneezing, congestion, runny nose. - Throat: No sore throat. No changes in swallowing. SKIN: No rash or itching.  CARDIOVASCULAR: No chest pain, chest pressure or chest discomfort. No palpitations or  edema.  RESPIRATORY: No shortness of breath, cough or sputum.  GASTROINTESTINAL: No anorexia, nausea, vomiting. No changes in bowel habits. No abdominal pain or blood.  GENITOURINARY: No dysuria. No frequency. No discharge.  NEUROLOGICAL: No headache, dizziness, syncope, paralysis, ataxia, numbness or tingling in the extremities. No memory changes. No change in bowel or bladder control.  MUSCULOSKELETAL: No joint pain. No muscle pain. HEMATOLOGIC: No anemia, bleeding or bruising.  LYMPHATICS: No enlarged lymph nodes.  PSYCHIATRIC: No change in mood. No change in sleep pattern.  ENDOCRINOLOGIC: No reports of sweating, cold or heat intolerance. No polyuria or polydipsia.   Objective  Filed Vitals:   08/07/14 1144  BP: 118/62  Pulse: 90  Temp: 98 F (36.7 C)  TempSrc: Oral  Resp: 16  Height: 5\' 2"  (1.575 m)  Weight: 209  lb 14.4 oz (95.21 kg)  SpO2: 95%   Body mass index is 38.38 kg/(m^2).  Depression screen PHQ 2/9 08/07/2014  Decreased Interest 0  Down, Depressed, Hopeless 0  PHQ - 2 Score 0    Physical Exam  Constitutional: Patient is obese and well-nourished. In no distress.  HEENT:  - Head: Normocephalic and atraumatic.  - Ears: Bilateral TMs gray, no erythema or effusion - Nose: Nasal mucosa moist - Mouth/Throat: Oropharynx is clear and moist. No tonsillar hypertrophy or erythema. No post nasal drainage.  - Eyes: Conjunctivae clear, EOM movements normal. PERRLA. No scleral icterus.  Neck: Normal range of motion. Neck supple. No JVD present. No thyromegaly present.  Cardiovascular: Normal rate, regular rhythm and normal heart sounds.  No murmur heard.  Pulmonary/Chest: Effort normal and breath sounds normal. No respiratory distress. Abdominal: Soft. Bowel sounds are normal, no distension. There is no tenderness. BREAST: Bilateral breast exam normal with no masses, skin changes or nipple discharge FEMALE GENITALIA:  External genitalia normal External urethra  normal Vaginal vault normal without discharge or lesions. External vaginal dryness. Cervix normal without discharge or lesions Bimanual exam normal without masses RECTAL: no rectal masses or hemorrhoids Musculoskeletal: Normal range of motion bilateral UE and LE, no joint effusions. Peripheral vascular: Right greater than Left trace pedal edema. Neurological: CN II-XII grossly intact with no focal deficits. Alert and oriented to person, place, and time. Coordination, balance, strength, speech and gait are normal.  Skin: Skin is warm and dry. No rash noted. No erythema.  Psychiatric: Patient has a normal mood and affect. Behavior is normal in office today. Judgment and thought content normal in office today.   Assessment & Plan  1. Annual physical exam Following up with specialist for colon cancer screening in a few weeks. Mammogram due 12/2014. Immunizations reviewed.  2. Need for pneumococcal vaccination  - Pneumococcal conjugate vaccine 13-valent  3. Hypercholesterolemia without hypertriglyceridemia  - CBC with Differential/Platelet - Comprehensive metabolic panel - Lipid panel  4. Hot flash, menopausal Increase Neurontin from 100mg  2 po qhs to 300mg  po qhs and may increase to 600mg  po qhs.  - CBC with Differential/Platelet - Comprehensive metabolic panel - gabapentin (NEURONTIN) 300 MG capsule; Take 1 capsule (300 mg total) by mouth at bedtime.  Dispense: 60 capsule; Refill: 2  5. Hypothyroidism, postop Currently on levothyroxine 266mcg a day, due to have lab work repeated.  - CBC with Differential/Platelet - Comprehensive metabolic panel - TSH - T3, free - T4, free  6. Cervical cancer screening Due to long lapse (over 5 years) since last cervical cancer screening patient was agreeable to performing a PAP smear test of her cervix.   - Pap IG w/ reflex to HPV when ASC-U  7. Edema extremities Stable

## 2014-08-09 LAB — PAP IG W/ RFLX HPV ASCU: PAP SMEAR COMMENT: 0

## 2014-08-12 ENCOUNTER — Encounter: Payer: Self-pay | Admitting: *Deleted

## 2014-08-13 NOTE — Discharge Instructions (Signed)

## 2014-08-14 DIAGNOSIS — E780 Pure hypercholesterolemia: Secondary | ICD-10-CM | POA: Diagnosis not present

## 2014-08-14 DIAGNOSIS — E890 Postprocedural hypothyroidism: Secondary | ICD-10-CM | POA: Diagnosis not present

## 2014-08-14 DIAGNOSIS — N951 Menopausal and female climacteric states: Secondary | ICD-10-CM | POA: Diagnosis not present

## 2014-08-15 ENCOUNTER — Encounter
Admission: RE | Disposition: A | Discharge status: Home / Self Care | Payer: Self-pay | Source: Ambulatory Visit | Attending: Gastroenterology

## 2014-08-15 ENCOUNTER — Ambulatory Visit: Payer: Medicare Other | Admitting: Anesthesiology

## 2014-08-15 ENCOUNTER — Ambulatory Visit
Admission: RE | Admit: 2014-08-15 | Discharge: 2014-08-15 | Disposition: A | Discharge status: Home / Self Care | Payer: Medicare Other | Source: Ambulatory Visit | Attending: Gastroenterology | Admitting: Gastroenterology

## 2014-08-15 DIAGNOSIS — K5790 Diverticulosis of intestine, part unspecified, without perforation or abscess without bleeding: Secondary | ICD-10-CM | POA: Diagnosis not present

## 2014-08-15 DIAGNOSIS — G709 Myoneural disorder, unspecified: Secondary | ICD-10-CM | POA: Diagnosis not present

## 2014-08-15 DIAGNOSIS — E039 Hypothyroidism, unspecified: Secondary | ICD-10-CM | POA: Diagnosis not present

## 2014-08-15 DIAGNOSIS — K640 First degree hemorrhoids: Secondary | ICD-10-CM | POA: Insufficient documentation

## 2014-08-15 DIAGNOSIS — Z96642 Presence of left artificial hip joint: Secondary | ICD-10-CM | POA: Insufficient documentation

## 2014-08-15 DIAGNOSIS — I10 Essential (primary) hypertension: Secondary | ICD-10-CM | POA: Diagnosis not present

## 2014-08-15 DIAGNOSIS — K5730 Diverticulosis of large intestine without perforation or abscess without bleeding: Secondary | ICD-10-CM | POA: Insufficient documentation

## 2014-08-15 DIAGNOSIS — K219 Gastro-esophageal reflux disease without esophagitis: Secondary | ICD-10-CM | POA: Diagnosis not present

## 2014-08-15 DIAGNOSIS — Z1211 Encounter for screening for malignant neoplasm of colon: Secondary | ICD-10-CM | POA: Insufficient documentation

## 2014-08-15 DIAGNOSIS — Z85850 Personal history of malignant neoplasm of thyroid: Secondary | ICD-10-CM | POA: Insufficient documentation

## 2014-08-15 DIAGNOSIS — Z79899 Other long term (current) drug therapy: Secondary | ICD-10-CM | POA: Diagnosis not present

## 2014-08-15 DIAGNOSIS — Z980 Intestinal bypass and anastomosis status: Secondary | ICD-10-CM | POA: Diagnosis not present

## 2014-08-15 DIAGNOSIS — M1990 Unspecified osteoarthritis, unspecified site: Secondary | ICD-10-CM | POA: Insufficient documentation

## 2014-08-15 HISTORY — DX: Presence of spectacles and contact lenses: Z97.3

## 2014-08-15 HISTORY — DX: Myoneural disorder, unspecified: G70.9

## 2014-08-15 HISTORY — PX: COLONOSCOPY WITH PROPOFOL: SHX5780

## 2014-08-15 LAB — CBC WITH DIFFERENTIAL/PLATELET
BASOS ABS: 0 10*3/uL (ref 0.0–0.2)
BASOS: 1 %
EOS (ABSOLUTE): 0.3 10*3/uL (ref 0.0–0.4)
Eos: 6 %
HEMATOCRIT: 38.7 % (ref 34.0–46.6)
HEMOGLOBIN: 12.9 g/dL (ref 11.1–15.9)
IMMATURE GRANS (ABS): 0 10*3/uL (ref 0.0–0.1)
Immature Granulocytes: 0 %
LYMPHS: 25 %
Lymphocytes Absolute: 1.3 10*3/uL (ref 0.7–3.1)
MCH: 30 pg (ref 26.6–33.0)
MCHC: 33.3 g/dL (ref 31.5–35.7)
MCV: 90 fL (ref 79–97)
MONOCYTES: 7 %
Monocytes Absolute: 0.4 10*3/uL (ref 0.1–0.9)
NEUTROS ABS: 3.3 10*3/uL (ref 1.4–7.0)
Neutrophils: 61 %
Platelets: 242 10*3/uL (ref 150–379)
RBC: 4.3 x10E6/uL (ref 3.77–5.28)
RDW: 14.2 % (ref 12.3–15.4)
WBC: 5.3 10*3/uL (ref 3.4–10.8)

## 2014-08-15 LAB — COMPREHENSIVE METABOLIC PANEL
A/G RATIO: 1.8 (ref 1.1–2.5)
ALT: 20 IU/L (ref 0–32)
AST: 15 IU/L (ref 0–40)
Albumin: 4.3 g/dL (ref 3.6–4.8)
Alkaline Phosphatase: 73 IU/L (ref 39–117)
BUN/Creatinine Ratio: 24 (ref 11–26)
BUN: 17 mg/dL (ref 8–27)
Bilirubin Total: 0.3 mg/dL (ref 0.0–1.2)
CALCIUM: 8.9 mg/dL (ref 8.7–10.3)
CO2: 26 mmol/L (ref 18–29)
CREATININE: 0.71 mg/dL (ref 0.57–1.00)
Chloride: 100 mmol/L (ref 97–108)
GFR calc Af Amer: 103 mL/min/{1.73_m2} (ref >59)
GFR, EST NON AFRICAN AMERICAN: 90 mL/min/{1.73_m2} (ref >59)
GLOBULIN, TOTAL: 2.4 g/dL (ref 1.5–4.5)
GLUCOSE: 108 mg/dL — AB (ref 65–99)
Potassium: 4.9 mmol/L (ref 3.5–5.2)
SODIUM: 144 mmol/L (ref 134–144)
TOTAL PROTEIN: 6.7 g/dL (ref 6.0–8.5)

## 2014-08-15 LAB — TSH: TSH: 1.95 u[IU]/mL (ref 0.450–4.500)

## 2014-08-15 LAB — LIPID PANEL
CHOL/HDL RATIO: 4.7 ratio — AB (ref 0.0–4.4)
Cholesterol, Total: 193 mg/dL (ref 100–199)
HDL: 41 mg/dL (ref >39)
LDL CALC: 100 mg/dL — AB (ref 0–99)
Triglycerides: 262 mg/dL — ABNORMAL HIGH (ref 0–149)
VLDL CHOLESTEROL CAL: 52 mg/dL — AB (ref 5–40)

## 2014-08-15 LAB — T4, FREE: FREE T4: 1.6 ng/dL (ref 0.82–1.77)

## 2014-08-15 LAB — HM COLONOSCOPY: HM COLON: NORMAL

## 2014-08-15 LAB — T3, FREE: T3, Free: 2.5 pg/mL (ref 2.0–4.4)

## 2014-08-15 SURGERY — COLONOSCOPY WITH PROPOFOL
Anesthesia: Monitor Anesthesia Care | Wound class: Contaminated

## 2014-08-15 MED ORDER — LIDOCAINE HCL (CARDIAC) 20 MG/ML IV SOLN: INTRAVENOUS | Status: DC | PRN

## 2014-08-15 MED ORDER — LIDOCAINE HCL (CARDIAC) 20 MG/ML IV SOLN
INTRAVENOUS | Status: DC | PRN
  Administered 2014-08-15: 10 mg via INTRAVENOUS

## 2014-08-15 MED ORDER — STERILE WATER FOR IRRIGATION IR SOLN
Status: DC | PRN
  Administered 2014-08-15: 08:00:00

## 2014-08-15 MED ORDER — ONDANSETRON HCL 4 MG/2ML IJ SOLN: 4.0000 mg | Freq: Once | INTRAMUSCULAR | Status: DC | PRN

## 2014-08-15 MED ORDER — PROPOFOL 10 MG/ML IV BOLUS: INTRAVENOUS | Status: DC | PRN

## 2014-08-15 MED ORDER — LACTATED RINGERS IV SOLN
INTRAVENOUS | Status: DC
  Administered 2014-08-15 (×2): via INTRAVENOUS

## 2014-08-15 MED ORDER — SODIUM CHLORIDE 0.9 % IV SOLN: INTRAVENOUS | Status: DC

## 2014-08-15 MED ORDER — PROPOFOL 10 MG/ML IV BOLUS
INTRAVENOUS | Status: DC | PRN
  Administered 2014-08-15 (×6): 20 mg via INTRAVENOUS

## 2014-08-15 MED ORDER — ACETAMINOPHEN 325 MG PO TABS: 325.0000 mg | ORAL_TABLET | ORAL | Status: DC | PRN

## 2014-08-15 MED ORDER — ACETAMINOPHEN 160 MG/5ML PO SOLN: 325.0000 mg | ORAL | Status: DC | PRN

## 2014-08-15 SURGICAL SUPPLY — 28 items

## 2014-08-15 NOTE — H&P (Signed)
[facility]  [address], [place], [place] [address] Phone: [phone] Fax : [phone]  Primary Care Physician:  Bobetta Lime, MD Primary Gastroenterologist:  Dr. Allen Norris  Pre-Procedure History & Physical: HPI:  Pamela Dodson is a 64 y.o. female is here for a screening colonoscopy.   Past Medical History  Diagnosis Date  . PONV (postoperative nausea and vomiting)     usually needs zofran/antiemetic prior to surgery  . Hypertension   . Hypothyroidism   . Cancer     hx thyroid cancer  . Blood transfusion   . GERD (gastroesophageal reflux disease)   . Arthritis   . Neuromuscular disorder     numbnes lower legs s/p knee replacements  . Wears contact lenses     Past Surgical History  Procedure Laterality Date  . Thyroidectomy  2009  . Colon surgery      diverticulitic mass removed  . Total knee arthroplasty  2009  . Appendectomy    . Cholecystectomy    . Cesarean section    . Ganglion cyst excision      L wrist  . Knee arthroscopy    . Total hip arthroplasty  12/16/2010    Procedure: TOTAL HIP ARTHROPLASTY;  Surgeon: Kerin Salen;  Location: [facility];  Service: Orthopedics;  Laterality: Left;  Depuy  . Joint replacement    . Carpal tunnel release Left 2016    Prior to Admission medications   Medication Sig Start Date End Date Taking? Authorizing Provider  amLODipine-benazepril (LOTREL) 10-20 MG per capsule Take 1 capsule by mouth daily.     Yes Historical Provider, MD  atorvastatin (LIPITOR) 20 MG tablet Take 20 mg by mouth daily.     Yes Historical Provider, MD  Bioflavonoid Products (VITAMIN C) CHEW Chew 1 capsule by mouth daily.   Yes Historical Provider, MD  Black Cohosh (REMIFEMIN PO) Take 1 tablet by mouth 2 (two) times daily. Over the counter medication. For hot flashes.    Yes Historical Provider, MD  Cholecalciferol 1000 UNITS capsule Take 1 capsule by mouth daily.   Yes Historical Provider, MD  gabapentin (NEURONTIN) 300 MG  capsule Take 1 capsule (300 mg total) by mouth at bedtime. 08/07/14  Yes Bobetta Lime, MD  indomethacin (INDOCIN) 50 MG capsule Take 1 capsule by mouth 2 (two) times daily.   Yes Historical Provider, MD  levothyroxine (SYNTHROID, LEVOTHROID) 200 MCG tablet Take 200 mcg by mouth daily.     Yes Historical Provider, MD  Omega-3 Fatty Acids (FISH OIL) 1000 MG CPDR Take 1 capsule by mouth daily.   Yes Historical Provider, MD  omeprazole (PRILOSEC) 20 MG capsule Take 20 mg by mouth daily. Takes about 1x/week   Yes Historical Provider, MD  trolamine salicylate (ASPERCREME) 10 % cream Apply 1 application topically 2 (two) times daily as needed. As needed for pain relief.    Yes Historical Provider, MD  Camphor-Menthol-Methyl Sal (SALONPAS) 1.2-5.7-6.3 % PTCH Apply 1 patch topically daily as needed. As needed for pain.     Historical Provider, MD    Allergies as of 06/14/2014  . (No Known Allergies)    Family History  Problem Relation Age of Onset  . Birth defects Son     congenital genetic abnormality    Social History   Social History  . Marital Status: Married    Spouse Name: N/A  . Number of Children: N/A  . Years of Education: N/A   Occupational History  . Not on  file.   Social History Main Topics  . Smoking status: Never Smoker   . Smokeless tobacco: Not on file  . Alcohol Use: 0.6 oz/week    1 Glasses of wine, 0 Standard drinks or equivalent per week     Comment: occasional  . Drug Use: No  . Sexual Activity:    Partners: Male   Other Topics Concern  . Not on file   Social History Narrative    Review of Systems: See HPI, otherwise negative ROS  Physical Exam: BP 127/78 mmHg  Pulse 102  Temp(Src) 97.9 F (36.6 C) (Temporal)  Resp 14  Ht 5\' 3"  (1.6 m)  Wt 210 lb (95.255 kg)  BMI 37.21 kg/m2  SpO2 95% General:   Alert,  pleasant and cooperative in NAD Head:  Normocephalic and atraumatic. Neck:  Supple; no masses or thyromegaly. Lungs:  Clear throughout to  auscultation.    Heart:  Regular rate and rhythm. Abdomen:  Soft, nontender and nondistended. Normal bowel sounds, without guarding, and without rebound.   Neurologic:  Alert and  oriented x4;  grossly normal neurologically.  Impression/Plan: Pamela Dodson is now here to undergo a screening colonoscopy.  Risks, benefits, and alternatives regarding colonoscopy have been reviewed with the patient.  Questions have been answered.  All parties agreeable.

## 2014-08-15 NOTE — Anesthesia Preprocedure Evaluation (Signed)
Anesthesia Evaluation  Patient identified by MRN, date of birth, ID band Patient awake    Reviewed: Allergy & Precautions, NPO status , Patient's Chart, lab work & pertinent test results  History of Anesthesia Complications (+) PONV and history of anesthetic complications  Airway Mallampati: II  TM Distance: >3 FB Neck ROM: Full    Dental   Pulmonary    Pulmonary exam normal       Cardiovascular hypertension, Normal cardiovascular exam    Neuro/Psych  Neuromuscular disease    GI/Hepatic GERD-  ,  Endo/Other  Hypothyroidism   Renal/GU      Musculoskeletal  (+) Arthritis -,   Abdominal   Peds  Hematology   Anesthesia Other Findings   Reproductive/Obstetrics                             Anesthesia Physical Anesthesia Plan  ASA: II  Anesthesia Plan: MAC   Post-op Pain Management:    Induction: Intravenous  Airway Management Planned: Nasal Cannula  Additional Equipment:   Intra-op Plan:   Post-operative Plan:   Informed Consent: I have reviewed the patients History and Physical, chart, labs and discussed the procedure including the risks, benefits and alternatives for the proposed anesthesia with the patient or authorized representative who has indicated his/her understanding and acceptance.     Plan Discussed with: CRNA  Anesthesia Plan Comments:         Anesthesia Quick Evaluation

## 2014-08-15 NOTE — Op Note (Signed)
[facility] Patient Name: Pamela Dodson Procedure Date: 08/15/2014 7:29 AM MRN: [id number] Account #: [id number] Date of Birth: 1950/03/29 Admit Type: Outpatient Age: 64 Room: [facility] OR ROOM 01 Gender: Female Note Status: Finalized Procedure:         Colonoscopy Indications:       Screening for colorectal malignant neoplasm Providers:         Lucilla Lame, MD Referring MD:      Ashok Norris, MD (Referring MD) Medicines:         Propofol per Anesthesia Complications:     No immediate complications. Procedure:         Pre-Anesthesia Assessment:                    - Prior to the procedure, a History and Physical was                     performed, and patient medications and allergies were                     reviewed. The patient's tolerance of previous anesthesia                     was also reviewed. The risks and benefits of the procedure                     and the sedation options and risks were discussed with the                     patient. All questions were answered, and informed consent                     was obtained. Prior Anticoagulants: The patient has taken                     no previous anticoagulant or antiplatelet agents. ASA                     Grade Assessment: II - A patient with mild systemic                     disease. After reviewing the risks and benefits, the                     patient was deemed in satisfactory condition to undergo                     the procedure.                    After obtaining informed consent, the colonoscope was                     passed under direct vision. Throughout the procedure, the                     patient's blood pressure, pulse, and oxygen saturations                     were monitored continuously. The Olympus CF-HQ190L                     Colonoscope (S#. [id number]) was introduced through the anus  and advanced to the the cecum, identified by appendiceal               orifice and ileocecal valve. The colonoscopy was performed                     without difficulty. The patient tolerated the procedure                     well. The quality of the bowel preparation was excellent. Findings:      The perianal and digital rectal examinations were normal.      There was evidence of a prior side-to-side ileo-colonic anastomosis in       the descending colon. This was patent. This was characterized by healthy       appearing mucosa.      A single small-mouthed diverticulum was found in the descending colon.      Non-bleeding internal hemorrhoids were found during retroflexion. The       hemorrhoids were Grade I (internal hemorrhoids that do not prolapse). Impression:        - Patent side-to-side ileo-colonic anastomosis,                     characterized by healthy appearing mucosa.                    - Diverticulosis in the descending colon.                    - Non-bleeding internal hemorrhoids.                    - No specimens collected. Recommendation:    - Repeat colonoscopy in 10 years for screening unless any                     change in family history or lower GI problems. Procedure Code(s): --- Professional ---                    [phone], Colonoscopy, flexible; diagnostic, including                     collection of specimen(s) by brushing or washing, when                     performed (separate procedure) Diagnosis Code(s): --- Professional ---                    Z12.11, Encounter for screening for malignant neoplasm of                     colon                    Z98.0, Intestinal bypass and anastomosis status CPT copyright 2014 American Medical Association. All rights reserved. The codes documented in this report are preliminary and upon coder review may  be revised to meet current compliance requirements. Lucilla Lame, MD 08/15/2014 7:49:43 AM This report has been signed electronically. Number of Addenda: 0 Note Initiated On:  08/15/2014 7:29 AM Scope Withdrawal Time: 0 hours 7 minutes 2 seconds  Total Procedure Duration: 0 hours 13 minutes 3 seconds       [facility]

## 2014-08-15 NOTE — Transfer of Care (Signed)
Immediate Anesthesia Transfer of Care Note  Patient: Pamela Dodson  Procedure(s) Performed: Procedure(s): COLONOSCOPY WITH PROPOFOL (N/A)  Patient Location: PACU  Anesthesia Type: MAC  Level of Consciousness: awake, alert  and patient cooperative  Airway and Oxygen Therapy: Patient Spontanous Breathing and Patient connected to supplemental oxygen  Post-op Assessment: Post-op Vital signs reviewed, Patient's Cardiovascular Status Stable, Respiratory Function Stable, Patent Airway and No signs of Nausea or vomiting  Post-op Vital Signs: Reviewed and stable  Complications: No apparent anesthesia complications

## 2014-08-15 NOTE — Anesthesia Postprocedure Evaluation (Signed)
  Anesthesia Post-op Note  Patient: Pamela Dodson  Procedure(s) Performed: Procedure(s): COLONOSCOPY WITH PROPOFOL (N/A)  Anesthesia type:MAC  Patient location: PACU  Post pain: Pain level controlled  Post assessment: Post-op Vital signs reviewed, Patient's Cardiovascular Status Stable, Respiratory Function Stable, Patent Airway and No signs of Nausea or vomiting  Post vital signs: Reviewed and stable  Last Vitals:  Filed Vitals:   08/15/14 0758  BP:   Pulse: 94  Temp:   Resp: 18    Level of consciousness: awake, alert  and patient cooperative  Complications: No apparent anesthesia complications

## 2014-08-16 ENCOUNTER — Encounter: Payer: Self-pay | Admitting: Gastroenterology

## 2015-01-16 ENCOUNTER — Other Ambulatory Visit: Payer: Self-pay | Admitting: Family Medicine

## 2015-01-22 ENCOUNTER — Ambulatory Visit (INDEPENDENT_AMBULATORY_CARE_PROVIDER_SITE_OTHER): Payer: Medicare Other | Admitting: Family Medicine

## 2015-01-22 ENCOUNTER — Encounter: Payer: Self-pay | Admitting: Family Medicine

## 2015-01-22 VITALS — BP 132/78 | HR 106 | Temp 99.0°F | Resp 16 | Ht 63.0 in | Wt 210.6 lb

## 2015-01-22 DIAGNOSIS — B029 Zoster without complications: Secondary | ICD-10-CM

## 2015-01-22 DIAGNOSIS — N951 Menopausal and female climacteric states: Secondary | ICD-10-CM | POA: Diagnosis not present

## 2015-01-22 HISTORY — DX: Zoster without complications: B02.9

## 2015-01-22 MED ORDER — VALACYCLOVIR HCL 1 G PO TABS: 1000.0000 mg | ORAL_TABLET | Freq: Three times a day (TID) | ORAL | Status: DC

## 2015-01-22 NOTE — Progress Notes (Signed)
Name: Pamela Dodson   MRN: [id number]    DOB: 1950-10-14   Date:01/22/2015       Progress Note  Subjective  Chief Complaint  Chief Complaint  Patient presents with  . Rash    onset noticed yesterday was itching and alittle soreness    HPI  Pamela Dodson is a 65 year old female with reports of a rash that started on her left upper arm yesterday. A few days prior to that there was some irritation/pruritis in the skin at the same area but not lesion until yesterday. The lesion is patchy and distributed on left upper arm and left upper back. Raised, red. No longer itchy or painful. No fevers, chills, change in soaps/detergents/perfumes/products. Did have shingles shot 03/02/2012 and has been under a lot of stress past 2 weeks. Denies facial involvement, visual changes, ear pain, hearing changes, change in taste.    Otherwise using Gabapentin 300 mg at bedtime for hot flashes along with black cohosh. Reports some improvement but still having a few days out of the week where the symptoms wake her up. Is currently post menopausal with all female organs intact.   Past Medical History  Diagnosis Date  . PONV (postoperative nausea and vomiting)     usually needs zofran/antiemetic prior to surgery  . Hypertension   . Hypothyroidism   . Cancer (HCC)     hx thyroid cancer  . Blood transfusion   . GERD (gastroesophageal reflux disease)   . Arthritis   . Neuromuscular disorder (HCC)     numbnes lower legs s/p knee replacements  . Wears contact lenses     Patient Active Problem List   Diagnosis Date Noted  . Shingles rash 01/22/2015  . Special screening for malignant neoplasms, colon   . Postsurgical intestinal bypass or anastomosis status   . Hypertension goal BP (blood pressure) < 150/90 08/07/2014  . Edema extremities 08/07/2014  . Gastro-esophageal reflux disease without esophagitis 08/07/2014  . History of knee replacement 08/07/2014  . H/O malignant neoplasm of thyroid 08/07/2014   . Hot flash, menopausal 08/07/2014  . Lumbar disc disease with radiculopathy 08/07/2014  . Dysmetabolic syndrome [id number]  . Adiposity 08/07/2014  . Hypothyroidism, postop 08/07/2014  . Generalized OA 08/07/2014  . Hypercholesterolemia without hypertriglyceridemia 08/07/2014  . Hernia of anterior abdominal wall 08/07/2014  . Annual physical exam 08/07/2014  . Need for pneumococcal vaccination 08/07/2014  . Vestibular neuronitis 08/07/2014  . Cervical cancer screening 08/07/2014  . Arthritis of hip 12/16/2010    Social History  Substance Use Topics  . Smoking status: Never Smoker   . Smokeless tobacco: Not on file  . Alcohol Use: 0.6 oz/week    1 Glasses of wine, 0 Standard drinks or equivalent per week     Comment: occasional     Current outpatient prescriptions:  .  amLODipine-benazepril (LOTREL) 10-20 MG per capsule, Take 1 capsule by mouth daily.  , Disp: , Rfl:  .  atorvastatin (LIPITOR) 20 MG tablet, Take 20 mg by mouth daily.  , Disp: , Rfl:  .  Bioflavonoid Products (VITAMIN C) CHEW, Chew 1 capsule by mouth daily., Disp: , Rfl:  .  Black Cohosh (REMIFEMIN PO), Take 1 tablet by mouth 2 (two) times daily. Over the counter medication. For hot flashes. , Disp: , Rfl:  .  Camphor-Menthol-Methyl Sal (SALONPAS) 1.2-5.7-6.3 % PTCH, Apply 1 patch topically daily as needed. As needed for pain. , Disp: , Rfl:  .  Cholecalciferol 1000 UNITS capsule,  Take 1 capsule by mouth daily., Disp: , Rfl:  .  gabapentin (NEURONTIN) 300 MG capsule, TAKE 1 CAPSULE(300 MG) BY MOUTH AT BEDTIME, Disp: 90 capsule, Rfl: 1 .  indomethacin (INDOCIN) 50 MG capsule, Take 1 capsule by mouth 2 (two) times daily., Disp: , Rfl:  .  levothyroxine (SYNTHROID, LEVOTHROID) 200 MCG tablet, Take 200 mcg by mouth daily.  , Disp: , Rfl:  .  Omega-3 Fatty Acids (FISH OIL) 1000 MG CPDR, Take 1 capsule by mouth daily., Disp: , Rfl:  .  omeprazole (PRILOSEC) 20 MG capsule, Take 20 mg by mouth daily. Takes about 1x/week,  Disp: , Rfl:  .  trolamine salicylate (ASPERCREME) 10 % cream, Apply 1 application topically 2 (two) times daily as needed. As needed for pain relief. , Disp: , Rfl:  .  valACYclovir (VALTREX) 1000 MG tablet, Take 1 tablet (1,000 mg total) by mouth every 8 (eight) hours., Disp: 21 tablet, Rfl: 0  Past Surgical History  Procedure Laterality Date  . Thyroidectomy  2009  . Colon surgery      diverticulitic mass removed  . Total knee arthroplasty  2009  . Appendectomy    . Cholecystectomy    . Cesarean section    . Ganglion cyst excision      L wrist  . Knee arthroscopy    . Total hip arthroplasty  12/16/2010    Procedure: TOTAL HIP ARTHROPLASTY;  Surgeon: Kerin Salen;  Location: [facility];  Service: Orthopedics;  Laterality: Left;  Depuy  . Joint replacement    . Carpal tunnel release Left 2016  . Colonoscopy with propofol N/A 08/15/2014    Procedure: COLONOSCOPY WITH PROPOFOL;  Surgeon: Lucilla Lame, MD;  Location: [facility];  Service: Endoscopy;  Laterality: N/A;    Family History  Problem Relation Age of Onset  . Birth defects Son     congenital genetic abnormality    No Known Allergies   Review of Systems  CONSTITUTIONAL: No significant weight changes, fever, chills, weakness or fatigue.  HEENT:  - Eyes: No visual changes.  - Ears: No auditory changes. No pain.  - Nose: No sneezing, congestion, runny nose. - Throat: No sore throat. No changes in swallowing. SKIN: YES RASH CARDIOVASCULAR: No chest pain, chest pressure or chest discomfort. No palpitations or edema.  RESPIRATORY: No shortness of breath, cough or sputum.  NEUROLOGICAL: No headache, dizziness, syncope, paralysis, ataxia, numbness or tingling in the extremities. No memory changes. No change in bowel or bladder control.  MUSCULOSKELETAL: No joint pain. Yes muscle pain. HEMATOLOGIC: No anemia, bleeding or bruising.  LYMPHATICS: No enlarged lymph nodes.  PSYCHIATRIC: No change in mood. No change in sleep  pattern.  ENDOCRINOLOGIC: Yes reports of feeling hot and uncomfortable at night some nights.    Objective  BP 132/78 mmHg  Pulse 106  Temp(Src) 99 F (37.2 C) (Oral)  Resp 16  Ht 5\' 3"  (1.6 m)  Wt 210 lb 9.6 oz (95.528 kg)  BMI 37.32 kg/m2  SpO2 92% Body mass index is 37.32 kg/(m^2).  Physical Exam  Constitutional: Patient is obese and well-nourished. In no distress.  Neck: Normal range of motion. Neck supple. No JVD present. No thyromegaly present.  Cardiovascular: Normal rate, regular rhythm and normal heart sounds.  No murmur heard.  Pulmonary/Chest: Effort normal and breath sounds normal. No respiratory distress. Musculoskeletal: Normal range of motion bilateral UE and LE, no joint effusions. Skin: Left arm large patchy area raised red island irregular borders. No open  skin, pustules, crusting or vesicles but upon blanching the area there seems to be early signs of vesicular lesions starting. Similar 2 smaller islands of the rash on left upper back. Skin is warm and dry otherwise. Psychiatric: Patient has a stable mood and affect. Behavior is normal in office today. Judgment and thought content normal in office today.   Assessment & Plan   1. Shingles rash Discussed pathology, outbreak, avoidance of children and immunocompromised individuals. Keep area covered.   - valACYclovir (VALTREX) 1000 MG tablet; Take 1 tablet (1,000 mg total) by mouth every 8 (eight) hours.  Dispense: 21 tablet; Refill: 0  2. Hot flash, menopausal Instructed patient to try gabapentin 300 mg plus her left over 100 mg capsule (from initial prescription) both at night or take the 100 mg capsule in the morning and the 300 mg at night.

## 2015-01-22 NOTE — Patient Instructions (Addendum)
1) Gabapentin take 100 mg with 300 mg at night. If this does not help try taking the 100 mg in the morning and 300 mg at night.  Shingles Shingles, which is also known as herpes zoster, is an infection that causes a painful skin rash and fluid-filled blisters. Shingles is not related to genital herpes, which is a sexually transmitted infection.   Shingles only develops in people who:  Have had chickenpox.  Have received the chickenpox vaccine. (This is rare.) CAUSES Shingles is caused by varicella-zoster virus (VZV). This is the same virus that causes chickenpox. After exposure to VZV, the virus stays in the body in an inactive (dormant) state. Shingles develops if the virus reactivates. This can happen many years after the initial exposure to VZV. It is not known what causes this virus to reactivate. RISK FACTORS People who have had chickenpox or received the chickenpox vaccine are at risk for shingles. Infection is more common in people who:  Are older than age 24.  Have a weakened defense (immune) system, such as those with HIV, AIDS, or cancer.  Are taking medicines that weaken the immune system, such as transplant medicines.  Are under great stress. SYMPTOMS Early symptoms of this condition include itching, tingling, and pain in an area on your skin. Pain may be described as burning, stabbing, or throbbing. A few days or weeks after symptoms start, a painful red rash appears, usually on one side of the body in a bandlike or beltlike pattern. The rash eventually turns into fluid-filled blisters that break open, scab over, and dry up in about 2-3 weeks. At any time during the infection, you may also develop:  A fever.  Chills.  A headache.  An upset stomach. DIAGNOSIS This condition is diagnosed with a skin exam. Sometimes, skin or fluid samples are taken from the blisters before a diagnosis is made. These samples are examined under a microscope or sent to a lab for  testing. TREATMENT There is no specific cure for this condition. Your health care provider will probably prescribe medicines to help you manage pain, recover more quickly, and avoid long-term problems. Medicines may include:  Antiviral drugs.  Anti-inflammatory drugs.  Pain medicines. If the area involved is on your face, you may be referred to a specialist, such as an eye doctor (ophthalmologist) or an ear, nose, and throat (ENT) doctor to help you avoid eye problems, chronic pain, or disability. HOME CARE INSTRUCTIONS Medicines  Take medicines only as directed by your health care provider.  Apply an anti-itch or numbing cream to the affected area as directed by your health care provider. Blister and Rash Care  Take a cool bath or apply cool compresses to the area of the rash or blisters as directed by your health care provider. This may help with pain and itching.  Keep your rash covered with a loose bandage (dressing). Wear loose-fitting clothing to help ease the pain of material rubbing against the rash.  Keep your rash and blisters clean with mild soap and cool water or as directed by your health care provider.  Check your rash every day for signs of infection. These include redness, swelling, and pain that lasts or increases.  Do not pick your blisters.  Do not scratch your rash. General Instructions  Rest as directed by your health care provider.  Keep all follow-up visits as directed by your health care provider. This is important.  Until your blisters scab over, your infection can cause chickenpox in  people who have never had it or been vaccinated against it. To prevent this from happening, avoid contact with other people, especially:  Babies.  Pregnant women.  Children who have eczema.  Elderly people who have transplants.  People who have chronic illnesses, such as leukemia or AIDS. SEEK MEDICAL CARE IF:  Your pain is not relieved with prescribed  medicines.  Your pain does not get better after the rash heals.  Your rash looks infected. Signs of infection include redness, swelling, and pain that lasts or increases. SEEK IMMEDIATE MEDICAL CARE IF:  The rash is on your face or nose.  You have facial pain, pain around your eye area, or loss of feeling on one side of your face.  You have ear pain or you have ringing in your ear.  You have loss of taste.  Your condition gets worse.   This information is not intended to replace advice given to you by your health care provider. Make sure you discuss any questions you have with your health care provider.   Document Released: 12/21/2004 Document Revised: 01/11/2014 Document Reviewed: 11/01/2013 Elsevier Interactive Patient Education [employer].

## 2015-02-13 ENCOUNTER — Ambulatory Visit: Payer: Medicare Other | Admitting: Family Medicine

## 2015-02-14 ENCOUNTER — Ambulatory Visit: Payer: Medicare Other | Admitting: Family Medicine

## 2015-02-14 ENCOUNTER — Other Ambulatory Visit: Payer: Self-pay | Admitting: Family Medicine

## 2015-02-20 ENCOUNTER — Emergency Department
Admission: EM | Admit: 2015-02-20 | Discharge: 2015-02-20 | Disposition: A | Discharge status: Home / Self Care | Payer: Medicare Other | Attending: Emergency Medicine | Admitting: Emergency Medicine

## 2015-02-20 DIAGNOSIS — H578 Other specified disorders of eye and adnexa: Secondary | ICD-10-CM | POA: Diagnosis present

## 2015-02-20 DIAGNOSIS — E039 Hypothyroidism, unspecified: Secondary | ICD-10-CM | POA: Diagnosis not present

## 2015-02-20 DIAGNOSIS — H43392 Other vitreous opacities, left eye: Secondary | ICD-10-CM

## 2015-02-20 DIAGNOSIS — Z79899 Other long term (current) drug therapy: Secondary | ICD-10-CM | POA: Diagnosis not present

## 2015-02-20 DIAGNOSIS — I10 Essential (primary) hypertension: Secondary | ICD-10-CM | POA: Insufficient documentation

## 2015-02-20 NOTE — ED Notes (Signed)
Pt arrives to ER via POV c/o left eye "streaks" to peripheral vision and a "floater" to the left eye. Pt denies pain or trauma. Pt denies weakness, dizziness, or any other sx. Pt alert and oriented X4, active, cooperative, pt in NAD. RR even and unlabored, color WNL.

## 2015-02-20 NOTE — Discharge Instructions (Signed)
Eye Floaters  Eye floaters are specks of material that float around inside your eye. A jelly-like fluid (vitreous) fills the inside of your eye. The vitreous is normally clear. It allows light to pass through to tissues at the back of the eye (retina). The retina contains the nerves needed for vision.   Your vitreous can start to shrink and become stringy as you age. Strands of material may start to float around inside the eye. They come from clumps of cells, blood, or other materials. These objects cast shadows on the retina and show up as floaters. Floaters may be more obvious when you look up at the sky or at a bright, blank background. They do not go away completely. In time, however, they may settle below your line of sight. Floaters can be annoying. They do not usually cause vision problems.  Sometimes floaters appear along with flashes. Flashes look like bright, quick streaks of light. They usually occur at the edge of your vision. Flashes result when your vitreous pulls on your retina. They also occur with age. However, they could be a warning sign of a detached retina. This is a serious condition that requires emergency treatment to prevent vision loss.  CAUSES   For most people, eye floaters develop when the vitreous begins to shrink as a normal part of aging. More serious causes of floaters include:   A torn retina.   Injury.    Bleeding inside the eye. Diabetes and other conditions can cause broken retinal blood vessels.   A blood clot in the major vein of the retina or its branches (retinal vein occlusion).   Retinal detachment.    Vitreous detachment.    Inflammation inside the eye (uveitis).    Infection inside the eye.  RISK FACTORS  You may have a higher risk for floaters if:    You are older.   You are nearsighted.   You have diabetes.   You have had cataracts removed.  SIGNS AND SYMPTOMS   Symptoms of floaters include seeing small, shadowy shapes move across your vision. They move  as your eyes move. They drift out of your vision when you keep your eyes still. These shapes may look like:   Specks.   Dots.   Circles.   Squiggly lines.   Thread.  Symptoms of flashes include seeing:   Bursts of light.   Flashing lights.   Lightning streaks.   What is commonly referred to as "stars."  DIAGNOSIS   Your health care provider may diagnose floaters and flashes based on your symptoms. You may need to see an eye care specialist (optometrist or ophthalmologist). The specialist will do an exam to determine whether your floaters are a normal part of aging or a warning sign of a more serious eye problem. The specialist may put drops in your eyes to open your pupils wide (dilate) and then use a special scope (slit lamp) to look inside your eye.   TREATMENT   No treatment is needed for floaters that occur normally with age. Sometimes floaters become severe enough to affect your vision. In rare cases, surgery to remove the vitreous and replace it with a saltwater solution (vitrectomy) may be considered.  HOME CARE INSTRUCTIONS  Keep all follow-up visits as directed by your health care provider. This is important.   SEEK MEDICAL CARE IF:    You have a sudden increase in floaters.   You have floaters along with flashes.   You have floaters   along with any new eye symptoms.  SEEK IMMEDIATE MEDICAL CARE IF:    You have a sudden increase in floaters or flashes that interferes with your vision.   Your vision suddenly changes.     This information is not intended to replace advice given to you by your health care provider. Make sure you discuss any questions you have with your health care provider.     Document Released: 12/24/2002 Document Revised: 01/11/2014 Document Reviewed: 08/15/2013  Elsevier Interactive Patient Education 2016 [employer]

## 2015-02-20 NOTE — ED Notes (Signed)
Pt eye exam R eye 20/30 L eye 20/40. Pt wearing corrective contact lenses to both eyes.

## 2015-02-20 NOTE — ED Provider Notes (Signed)
[facility] Emergency Department Provider Note  ____________________________________________  Time seen: Approximately 8:35 PM  I have reviewed the triage vital signs and the nursing notes.   HISTORY  Chief Complaint Eye Problem    HPI Pamela Dodson is a 65 y.o. female patient complaining of left eye street to peripheral vision and floaters to the left eye. Patient denies any pain or trauma. Patient denies any weakness dizziness or decreased vision. Patient wears contact lens. No palliative measures taken for this complaint. Incident occurred approximately one half hours ago. Patient states when she sits still the Floater disappears.  Past Medical History  Diagnosis Date  . PONV (postoperative nausea and vomiting)     usually needs zofran/antiemetic prior to surgery  . Hypertension   . Hypothyroidism   . Cancer (HCC)     hx thyroid cancer  . Blood transfusion   . GERD (gastroesophageal reflux disease)   . Arthritis   . Neuromuscular disorder (HCC)     numbnes lower legs s/p knee replacements  . Wears contact lenses     Patient Active Problem List   Diagnosis Date Noted  . Shingles rash 01/22/2015  . Postsurgical intestinal bypass or anastomosis status   . Hypertension goal BP (blood pressure) < 150/90 08/07/2014  . Edema extremities 08/07/2014  . Gastro-esophageal reflux disease without esophagitis 08/07/2014  . History of knee replacement 08/07/2014  . H/O malignant neoplasm of thyroid 08/07/2014  . Hot flash, menopausal 08/07/2014  . Lumbar disc disease with radiculopathy 08/07/2014  . Dysmetabolic syndrome [id number]  . Adiposity 08/07/2014  . Hypothyroidism, postop 08/07/2014  . Generalized OA 08/07/2014  . Hypercholesterolemia without hypertriglyceridemia 08/07/2014  . Hernia of anterior abdominal wall 08/07/2014  . Vestibular neuronitis 08/07/2014  . Cervical cancer screening 08/07/2014  . Arthritis of hip 12/16/2010    Past  Surgical History  Procedure Laterality Date  . Thyroidectomy  2009  . Colon surgery      diverticulitic mass removed  . Total knee arthroplasty  2009  . Appendectomy    . Cholecystectomy    . Cesarean section    . Ganglion cyst excision      L wrist  . Knee arthroscopy    . Total hip arthroplasty  12/16/2010    Procedure: TOTAL HIP ARTHROPLASTY;  Surgeon: Kerin Salen;  Location: [place];  Service: Orthopedics;  Laterality: Left;  Depuy  . Joint replacement    . Carpal tunnel release Left 2016  . Colonoscopy with propofol N/A 08/15/2014    Procedure: COLONOSCOPY WITH PROPOFOL;  Surgeon: Lucilla Lame, MD;  Location: [place];  Service: Endoscopy;  Laterality: N/A;    Current Outpatient Rx  Name  Route  Sig  Dispense  Refill  . amLODipine-benazepril (LOTREL) 10-20 MG per capsule   Oral   Take 1 capsule by mouth daily.           Marland Kitchen atorvastatin (LIPITOR) 20 MG tablet   Oral   Take 20 mg by mouth daily.           . Bioflavonoid Products (VITAMIN C) CHEW   Oral   Chew 1 capsule by mouth daily.         Renard Hamper Cohosh (REMIFEMIN PO)   Oral   Take 1 tablet by mouth 2 (two) times daily. Over the counter medication. For hot flashes.          . Camphor-Menthol-Methyl Sal (SALONPAS) 1.2-5.7-6.3 % PTCH   Apply externally  Apply 1 patch topically daily as needed. As needed for pain.          . Cholecalciferol 1000 UNITS capsule   Oral   Take 1 capsule by mouth daily.         Marland Kitchen gabapentin (NEURONTIN) 300 MG capsule      TAKE 1 CAPSULE(300 MG) BY MOUTH AT BEDTIME   90 capsule   1   . indomethacin (INDOCIN) 50 MG capsule   Oral   Take 1 capsule by mouth 2 (two) times daily.         Marland Kitchen levothyroxine (SYNTHROID, LEVOTHROID) 200 MCG tablet      TAKE 1 TABLET BY MOUTH EVERY DAY   90 tablet   1   . Omega-3 Fatty Acids (FISH OIL) 1000 MG CPDR   Oral   Take 1 capsule by mouth daily.         Marland Kitchen omeprazole (PRILOSEC) 20 MG capsule   Oral   Take 20 mg by  mouth daily. Takes about 1x/week         . trolamine salicylate (ASPERCREME) 10 % cream   Topical   Apply 1 application topically 2 (two) times daily as needed. As needed for pain relief.          . valACYclovir (VALTREX) 1000 MG tablet   Oral   Take 1 tablet (1,000 mg total) by mouth every 8 (eight) hours.   21 tablet   0     Allergies Review of patient's allergies indicates no known allergies.  Family History  Problem Relation Age of Onset  . Birth defects Son     congenital genetic abnormality    Social History Social History  Substance Use Topics  . Smoking status: Never Smoker   . Smokeless tobacco: None  . Alcohol Use: 0.6 oz/week    1 Glasses of wine, 0 Standard drinks or equivalent per week     Comment: occasional    Review of Systems Constitutional: No fever/chills Eyes: Floater in left eye.  ENT: No sore throat. Cardiovascular: Denies chest pain. Respiratory: Denies shortness of breath. Gastrointestinal: No abdominal pain.  No nausea, no vomiting.  No diarrhea.  No constipation. Genitourinary: Negative for dysuria. Musculoskeletal: Negative for back pain. Skin: Negative for rash. Neurological: Negative for headaches, focal weakness or numbness. Endocrine:Hypertension and hypothyroidism. ____________________________________________   PHYSICAL EXAM:  VITAL SIGNS: ED Triage Vitals  Enc Vitals Group     BP 02/20/15 1955 167/72 mmHg     Pulse Rate 02/20/15 1955 108     Resp 02/20/15 1955 18     Temp 02/20/15 1955 98.1 F (36.7 C)     Temp Source 02/20/15 1955 Oral     SpO2 02/20/15 1955 99 %     Weight 02/20/15 1955 208 lb (94.348 kg)     Height 02/20/15 1955 5\' 3"  (1.6 m)     Head Cir --      Peak Flow --      Pain Score --      Pain Loc --      Pain Edu? --      Excl. in [place]? --     Constitutional: Alert and oriented. Well appearing and in no acute distress. Eyes: Conjunctivae are normal. PERRL. EOMI. see vision acuity in nurse's  notes. Patient has intact peripheral vision. No lesion identified on ophthalmic exam. Head: Atraumatic. Nose: No congestion/rhinnorhea. Mouth/Throat: Mucous membranes are moist.  Oropharynx non-erythematous. Neck: No stridor.  No cervical spine  tenderness to palpation. Hematological/Lymphatic/Immunilogical: No cervical lymphadenopathy. Cardiovascular: Normal rate, regular rhythm. Grossly normal heart sounds.  Good peripheral circulation. Respiratory: Normal respiratory effort.  No retractions. Lungs CTAB. Gastrointestinal: Soft and nontender. No distention. No abdominal bruits. No CVA tenderness. Musculoskeletal: No lower extremity tenderness nor edema.  No joint effusions. Neurologic:  Normal speech and language. No gross focal neurologic deficits are appreciated. No gait instability. Skin:  Skin is warm, dry and intact. No rash noted. Psychiatric: Mood and affect are normal. Speech and behavior are normal.  ____________________________________________   LABS (all labs ordered are listed, but only abnormal results are displayed)  Labs Reviewed - No data to display ____________________________________________  EKG   ____________________________________________  RADIOLOGY   ____________________________________________   PROCEDURES  Procedure(s) performed: None  Critical Care performed: No  ____________________________________________   INITIAL IMPRESSION / ASSESSMENT AND PLAN / ED COURSE  Pertinent labs & imaging results that were available during my care of the patient were reviewed by me and considered in my medical decision making (see chart for details) __Left vitreous floater. Patient will follow-up with ophthalmology directed. Patient advised return by ER if condition worsens before being seen ophthalmologist. _________________________________________   FINAL CLINICAL IMPRESSION(S) / ED DIAGNOSES  Final diagnoses:  Floater, vitreous, left      Sable Feil, PA-C 02/20/15 2047  Orbie Pyo, MD 02/20/15 2322

## 2015-02-21 DIAGNOSIS — H43813 Vitreous degeneration, bilateral: Secondary | ICD-10-CM | POA: Diagnosis not present

## 2015-03-21 ENCOUNTER — Other Ambulatory Visit: Payer: Self-pay | Admitting: Family Medicine

## 2015-05-07 DIAGNOSIS — M25552 Pain in left hip: Secondary | ICD-10-CM | POA: Diagnosis not present

## 2015-06-16 ENCOUNTER — Other Ambulatory Visit: Payer: Self-pay

## 2015-06-16 MED ORDER — AMLODIPINE BESY-BENAZEPRIL HCL 10-20 MG PO CAPS: 1.0000 | ORAL_CAPSULE | Freq: Every day | ORAL | Status: DC

## 2015-06-16 MED ORDER — ATORVASTATIN CALCIUM 20 MG PO TABS: 20.0000 mg | ORAL_TABLET | Freq: Every day | ORAL | Status: DC

## 2015-06-16 NOTE — Telephone Encounter (Signed)
Last labs Aug 2016 reviewed; she has appt next week; Rx approved for just 30 days; needs labs, may have to adjust statin dose; may have to adjust BP med

## 2015-06-23 ENCOUNTER — Other Ambulatory Visit: Payer: Self-pay | Admitting: Family Medicine

## 2015-06-23 ENCOUNTER — Encounter: Payer: Self-pay | Admitting: Family Medicine

## 2015-06-23 ENCOUNTER — Ambulatory Visit (INDEPENDENT_AMBULATORY_CARE_PROVIDER_SITE_OTHER): Payer: Medicare Other | Admitting: Family Medicine

## 2015-06-23 VITALS — BP 134/67 | HR 90 | Temp 97.8°F | Resp 16 | Wt 208.0 lb

## 2015-06-23 DIAGNOSIS — R232 Flushing: Secondary | ICD-10-CM

## 2015-06-23 DIAGNOSIS — Z5181 Encounter for therapeutic drug level monitoring: Secondary | ICD-10-CM

## 2015-06-23 DIAGNOSIS — Z780 Asymptomatic menopausal state: Secondary | ICD-10-CM

## 2015-06-23 DIAGNOSIS — E890 Postprocedural hypothyroidism: Secondary | ICD-10-CM | POA: Diagnosis not present

## 2015-06-23 DIAGNOSIS — E7800 Pure hypercholesterolemia, unspecified: Secondary | ICD-10-CM

## 2015-06-23 DIAGNOSIS — M1990 Unspecified osteoarthritis, unspecified site: Secondary | ICD-10-CM

## 2015-06-23 DIAGNOSIS — I10 Essential (primary) hypertension: Secondary | ICD-10-CM

## 2015-06-23 DIAGNOSIS — Z1159 Encounter for screening for other viral diseases: Secondary | ICD-10-CM

## 2015-06-23 DIAGNOSIS — N951 Menopausal and female climacteric states: Secondary | ICD-10-CM

## 2015-06-23 DIAGNOSIS — Z1239 Encounter for other screening for malignant neoplasm of breast: Secondary | ICD-10-CM

## 2015-06-23 DIAGNOSIS — M1610 Unilateral primary osteoarthritis, unspecified hip: Secondary | ICD-10-CM

## 2015-06-23 MED ORDER — VENLAFAXINE HCL ER 37.5 MG PO CP24
37.5000 mg | ORAL_CAPSULE | Freq: Every day | ORAL | Status: DC
Start: 2015-06-23 — End: 2015-11-13

## 2015-06-23 NOTE — Assessment & Plan Note (Signed)
Check TSH 

## 2015-06-23 NOTE — Progress Notes (Signed)
Subjective:     Patient ID: Pamela Dodson, female   DOB: 09/06/1950, 65 y.o.   MRN: [id number]  HPI  Review of Systems   Past Medical History  Diagnosis Date  . PONV (postoperative nausea and vomiting)     usually needs zofran/antiemetic prior to surgery  . Hypertension   . Hypothyroidism   . Cancer (HCC)     hx thyroid cancer  . Blood transfusion   . GERD (gastroesophageal reflux disease)   . Arthritis   . Neuromuscular disorder (HCC)     numbnes lower legs s/p knee replacements  . Wears contact lenses    Past Surgical History  Procedure Laterality Date  . Thyroidectomy  2009  . Colon surgery      diverticulitic mass removed  . Total knee arthroplasty  2009  . Appendectomy    . Cholecystectomy    . Cesarean section    . Ganglion cyst excision      L wrist  . Knee arthroscopy    . Total hip arthroplasty  12/16/2010    Procedure: TOTAL HIP ARTHROPLASTY;  Surgeon: Kerin Salen;  Location: [place];  Service: Orthopedics;  Laterality: Left;  Depuy  . Joint replacement    . Carpal tunnel release Left 2016  . Colonoscopy with propofol N/A 08/15/2014    Procedure: COLONOSCOPY WITH PROPOFOL;  Surgeon: Lucilla Lame, MD;  Location: [place];  Service: Endoscopy;  Laterality: N/A;     Current outpatient prescriptions:  .  amLODipine-benazepril (LOTREL) 10-20 MG capsule, Take 1 capsule by mouth daily., Disp: 30 capsule, Rfl: 0 .  atorvastatin (LIPITOR) 20 MG tablet, Take 1 tablet (20 mg total) by mouth daily., Disp: 30 tablet, Rfl: 0 .  Bioflavonoid Products (VITAMIN C) CHEW, Chew 1 capsule by mouth daily., Disp: , Rfl:  .  Black Cohosh (REMIFEMIN PO), Take 1 tablet by mouth 2 (two) times daily. Over the counter medication. For hot flashes. , Disp: , Rfl:  .  Camphor-Menthol-Methyl Sal (SALONPAS) 1.2-5.7-6.3 % PTCH, Apply 1 patch topically daily as needed. As needed for pain. , Disp: , Rfl:  .  Cholecalciferol 1000 UNITS capsule, Take 1 capsule by mouth daily., Disp: ,  Rfl:  .  gabapentin (NEURONTIN) 300 MG capsule, TAKE 1 CAPSULE(300 MG) BY MOUTH AT BEDTIME, Disp: 90 capsule, Rfl: 1 .  indomethacin (INDOCIN) 50 MG capsule, Take 1 capsule by mouth 2 (two) times daily., Disp: , Rfl:  .  levothyroxine (SYNTHROID, LEVOTHROID) 200 MCG tablet, TAKE 1 TABLET BY MOUTH EVERY DAY, Disp: 90 tablet, Rfl: 1 .  Omega-3 Fatty Acids (FISH OIL) 1000 MG CPDR, Take 1 capsule by mouth daily., Disp: , Rfl:  .  omeprazole (PRILOSEC) 20 MG capsule, Take 20 mg by mouth daily. Takes about 1x/week, Disp: , Rfl:  .  trolamine salicylate (ASPERCREME) 10 % cream, Apply 1 application topically 2 (two) times daily as needed. As needed for pain relief. , Disp: , Rfl:  .  valACYclovir (VALTREX) 1000 MG tablet, Take 1 tablet (1,000 mg total) by mouth every 8 (eight) hours., Disp: 21 tablet, Rfl: 0  Family History  Problem Relation Age of Onset  . Birth defects Son     congenital genetic abnormality   Social History  Substance Use Topics  . Smoking status: Never Smoker   . Smokeless tobacco: Not on file  . Alcohol Use: 0.6 oz/week    1 Glasses of wine, 0 Standard drinks or equivalent per week  Comment: occasional       Objective:   Physical Exam     Assessment:         Plan:          .lada

## 2015-06-23 NOTE — Progress Notes (Signed)
BP 134/67 mmHg  Pulse 90  Temp(Src) 97.8 F (36.6 C) (Oral)  Resp 16  Wt 208 lb (94.348 kg)  SpO2 93%   Subjective:    Patient ID: Pamela Dodson, female    DOB: September 27, 1950, 65 y.o.   MRN: [id number]  HPI: Pamela Dodson is a 65 y.o. female  Chief Complaint  Patient presents with  . Medication Refill   Patient is new to me; her previous provider left the practice  She was started on gabapentin for hot flashes, night sweats; finished up with HRT; done for years; okay during the daytime; night times are worse; spicy foods can trigger; wakes up to go to the bathroom; daytime was not real helpful; has some fog; wants to be alert for performances No unexplained weight loss  TSH due to be checked today; 200 mcg 6 days of the week, skipping one day = 171.4 mcg daily  Hx of high cholesterol in the family; on just diet; unable to exercise fully; s/p both knee replacements, one hip replacement; sees ortho; on NSAIDs; back issue, ruptured disc, babying the right hip; doesn't get the exercise she should; likes to exercise in the pool; water aerobics; uncomfortable, little sciatica down the leg at times; does eat eggs, some weeks none; not much cheese; white cheese occasionally with hamburger; does drink skim milk;; rare fatty meats; not truly fasting, blueberries, cereal, skim milk; evening meals are the worst meals; on statin  Depression screen [facility] 2/9 06/23/2015 01/22/2015 08/07/2014  Decreased Interest 0 0 0  Down, Depressed, Hopeless 1 0 0  PHQ - 2 Score 1 0 0   Relevant past medical, surgical, family and social history reviewed Past Medical History  Diagnosis Date  . PONV (postoperative nausea and vomiting)     usually needs zofran/antiemetic prior to surgery  . Hypertension   . Hypothyroidism   . Cancer (HCC)     hx thyroid cancer  . Blood transfusion   . GERD (gastroesophageal reflux disease)   . Arthritis   . Neuromuscular disorder (HCC)     numbnes lower legs s/p knee  replacements  . Wears contact lenses    Past Surgical History  Procedure Laterality Date  . Thyroidectomy  2009  . Colon surgery      diverticulitic mass removed  . Total knee arthroplasty  2009  . Appendectomy    . Cholecystectomy    . Cesarean section    . Ganglion cyst excision      L wrist  . Knee arthroscopy    . Total hip arthroplasty  12/16/2010    Procedure: TOTAL HIP ARTHROPLASTY;  Surgeon: Kerin Salen;  Location: [place];  Service: Orthopedics;  Laterality: Left;  Depuy  . Joint replacement    . Carpal tunnel release Left 2016  . Colonoscopy with propofol N/A 08/15/2014    Procedure: COLONOSCOPY WITH PROPOFOL;  Surgeon: Lucilla Lame, MD;  Location: [place];  Service: Endoscopy;  Laterality: N/A;   Family History  Problem Relation Age of Onset  . Birth defects Son     congenital genetic abnormality  . Cancer Mother     lung  . Myelodysplastic syndrome Mother   . Hypothyroidism Mother   . Hypertension Brother    Social History  Substance Use Topics  . Smoking status: Never Smoker   . Smokeless tobacco: None  . Alcohol Use: 0.6 oz/week    1 Glasses of wine, 0 Standard drinks or equivalent per week  Comment: occasional   Interim medical history since last visit reviewed. Allergies and medications reviewed  Review of Systems Per HPI unless specifically indicated above     Objective:    BP 134/67 mmHg  Pulse 90  Temp(Src) 97.8 F (36.6 C) (Oral)  Resp 16  Wt 208 lb (94.348 kg)  SpO2 93%  Wt Readings from Last 3 Encounters:  06/23/15 208 lb (94.348 kg)  02/20/15 208 lb (94.348 kg)  01/22/15 210 lb 9.6 oz (95.528 kg)   body mass index is 36.85 kg/(m^2).  Physical Exam  Constitutional: She appears well-developed and well-nourished. No distress.  HENT:  Head: Normocephalic and atraumatic.  Eyes: EOM are normal. No scleral icterus.  Neck: No thyromegaly present.  Cardiovascular: Normal rate, regular rhythm and normal heart sounds.   No  murmur heard. Pulmonary/Chest: Effort normal and breath sounds normal. No respiratory distress. She has no wheezes.  Abdominal: Soft. Bowel sounds are normal. She exhibits no distension.  Musculoskeletal: Normal range of motion. She exhibits no edema.  Neurological: She is alert. She exhibits normal muscle tone.  Skin: Skin is warm and dry. She is not diaphoretic. No pallor.  Psychiatric: She has a normal mood and affect. Her behavior is normal. Judgment and thought content normal.       Assessment & Plan:   Problem List Items Addressed This Visit      Cardiovascular and Mediastinum   Hypertension goal BP (blood pressure) < 150/90    DASH guidelines        Endocrine   Hypothyroidism, postop - Primary    Check TSH      Relevant Orders   TSH (Completed)     Musculoskeletal and Integument   Arthritis of hip (Chronic)    Check Cr        Other   Medication monitoring encounter   Relevant Orders   COMPLETE METABOLIC PANEL WITH GFR (Completed)   Hypercholesterolemia without hypertriglyceridemia    Check lipids      Relevant Orders   Lipid panel (Completed)    Other Visit Diagnoses    Hot flashes        Relevant Medications    venlafaxine XR (EFFEXOR XR) 37.5 MG 24 hr capsule    Need for hepatitis C screening test        Relevant Orders    Hepatitis C antibody, reflex    Breast cancer screening        Relevant Orders    MM DIGITAL SCREENING BILATERAL    Postmenopausal status        Relevant Orders    DG Bone Density       Follow up plan: Return in about 6 months (around 12/23/2015) for routine f/u.  An after-visit summary was printed and given to the patient at [facility].  Please see the patient instructions which may contain other information and recommendations beyond what is mentioned above in the assessment and plan.  Meds ordered this encounter  Medications  . venlafaxine XR (EFFEXOR XR) 37.5 MG 24 hr capsule    Sig: Take 1 capsule (37.5 mg total) by  mouth daily with breakfast.    Dispense:  30 capsule    Refill:  5   Orders Placed This Encounter  Procedures  . DG Bone Density  . MM DIGITAL SCREENING BILATERAL  . Hepatitis C antibody, reflex  . TSH  . Lipid panel  . COMPLETE METABOLIC PANEL WITH GFR

## 2015-06-23 NOTE — Assessment & Plan Note (Signed)
Check lipids 

## 2015-06-23 NOTE — Patient Instructions (Addendum)
Avoid salt substitutes Let's check labs today  Your goal blood pressure is less than 150 mmHg on top. Try to follow the DASH guidelines (DASH stands for Dietary Approaches to Stop Hypertension) Try to limit the sodium in your diet.  Ideally, consume less than 1.5 grams (less than 1,500mg ) per day. Do not add salt when cooking or at the table.  Check the sodium amount on labels when shopping, and choose items lower in sodium when given a choice. Avoid or limit foods that already contain a lot of sodium. Eat a diet rich in fruits and vegetables and whole grains.  Try to limit saturated fats in your diet (bologna, hot dogs, barbeque, cheeseburgers, hamburgers, steak, bacon, sausage, cheese, etc.) and get more fresh fruits, vegetables, and whole grains  DASH Eating Plan DASH stands for "Dietary Approaches to Stop Hypertension." The DASH eating plan is a healthy eating plan that has been shown to reduce high blood pressure (hypertension). Additional health benefits may include reducing the risk of type 2 diabetes mellitus, heart disease, and stroke. The DASH eating plan may also help with weight loss. WHAT DO I NEED TO KNOW ABOUT THE DASH EATING PLAN? For the DASH eating plan, you will follow these general guidelines:  Choose foods with a percent daily value for sodium of less than 5% (as listed on the food label).  Use salt-free seasonings or herbs instead of table salt or sea salt.  Check with your health care provider or pharmacist before using salt substitutes.  Eat lower-sodium products, often labeled as "lower sodium" or "no salt added."  Eat fresh foods.  Eat more vegetables, fruits, and low-fat dairy products.  Choose whole grains. Look for the word "whole" as the first word in the ingredient list.  Choose fish and skinless chicken or [place] more often than red meat. Limit fish, poultry, and meat to 6 oz (170 g) each day.  Limit sweets, desserts, sugars, and sugary drinks.  Choose  heart-healthy fats.  Limit cheese to 1 oz (28 g) per day.  Eat more home-cooked food and less restaurant, buffet, and fast food.  Limit fried foods.  Cook foods using methods other than frying.  Limit canned vegetables. If you do use them, rinse them well to decrease the sodium.  When eating at a restaurant, ask that your food be prepared with less salt, or no salt if possible. WHAT FOODS CAN I EAT? Seek help from a dietitian for individual calorie needs. Grains Whole grain or whole wheat bread. Brown rice. Whole grain or whole wheat pasta. Quinoa, bulgur, and whole grain cereals. Low-sodium cereals. Corn or whole wheat flour tortillas. Whole grain cornbread. Whole grain crackers. Low-sodium crackers. Vegetables Fresh or frozen vegetables (raw, steamed, roasted, or grilled). Low-sodium or reduced-sodium tomato and vegetable juices. Low-sodium or reduced-sodium tomato sauce and paste. Low-sodium or reduced-sodium canned vegetables.  Fruits All fresh, canned (in natural juice), or frozen fruits. Meat and Other Protein Products Ground beef (85% or leaner), grass-fed beef, or beef trimmed of fat. Skinless chicken or [place]. Ground chicken or [place]. Pork trimmed of fat. All fish and seafood. Eggs. Dried beans, peas, or lentils. Unsalted nuts and seeds. Unsalted canned beans. Dairy Low-fat dairy products, such as skim or 1% milk, 2% or reduced-fat cheeses, low-fat ricotta or cottage cheese, or plain low-fat yogurt. Low-sodium or reduced-sodium cheeses. Fats and Oils Tub margarines without trans fats. Light or reduced-fat mayonnaise and salad dressings (reduced sodium). Avocado. Safflower, olive, or canola oils. Natural peanut or  almond butter. Other Unsalted popcorn and pretzels. The items listed above may not be a complete list of recommended foods or beverages. Contact your dietitian for more options. WHAT FOODS ARE NOT RECOMMENDED? Grains White bread. White pasta. White rice. Refined  cornbread. Bagels and croissants. Crackers that contain trans fat. Vegetables Creamed or fried vegetables. Vegetables in a cheese sauce. Regular canned vegetables. Regular canned tomato sauce and paste. Regular tomato and vegetable juices. Fruits Dried fruits. Canned fruit in light or heavy syrup. Fruit juice. Meat and Other Protein Products Fatty cuts of meat. Ribs, chicken wings, bacon, sausage, bologna, salami, chitterlings, fatback, hot dogs, bratwurst, and packaged luncheon meats. Salted nuts and seeds. Canned beans with salt. Dairy Whole or 2% milk, cream, half-and-half, and cream cheese. Whole-fat or sweetened yogurt. Full-fat cheeses or blue cheese. Nondairy creamers and whipped toppings. Processed cheese, cheese spreads, or cheese curds. Condiments Onion and garlic salt, seasoned salt, table salt, and sea salt. Canned and packaged gravies. Worcestershire sauce. Tartar sauce. Barbecue sauce. Teriyaki sauce. Soy sauce, including reduced sodium. Steak sauce. Fish sauce. Oyster sauce. Cocktail sauce. Horseradish. Ketchup and mustard. Meat flavorings and tenderizers. Bouillon cubes. Hot sauce. Tabasco sauce. Marinades. Taco seasonings. Relishes. Fats and Oils Butter, stick margarine, lard, shortening, ghee, and bacon fat. Coconut, palm kernel, or palm oils. Regular salad dressings. Other Pickles and olives. Salted popcorn and pretzels. The items listed above may not be a complete list of foods and beverages to avoid. Contact your dietitian for more information. WHERE CAN I FIND MORE INFORMATION? National Heart, Lung, and Blood Institute: travelstabloid.com   This information is not intended to replace advice given to you by your health care provider. Make sure you discuss any questions you have with your health care provider.   Document Released: 12/10/2010 Document Revised: 01/11/2014 Document Reviewed: 10/25/2012 Elsevier Interactive Patient Education  2016 Elsevier Inc. Cholesterol Cholesterol is a white, waxy, fat-like substance needed by your body in small amounts. The liver makes all the cholesterol you need. Cholesterol is carried from the liver by the blood through the blood vessels. Deposits of cholesterol (plaque) may build up on blood vessel walls. These make the arteries narrower and stiffer. Cholesterol plaques increase the risk for heart attack and stroke.  You cannot feel your cholesterol level even if it is very high. The only way to know it is high is with a blood test. Once you know your cholesterol levels, you should keep a record of the test results. Work with your health care provider to keep your levels in the desired range.  WHAT DO THE RESULTS MEAN?  Total cholesterol is a rough measure of all the cholesterol in your blood.   LDL is the so-called bad cholesterol. This is the type that deposits cholesterol in the walls of the arteries. You want this level to be low.   HDL is the good cholesterol because it cleans the arteries and carries the LDL away. You want this level to be high.  Triglycerides are fat that the body can either burn for energy or store. High levels are closely linked to heart disease.  WHAT ARE THE DESIRED LEVELS OF CHOLESTEROL?  Total cholesterol below 200.   LDL below 100 for people at risk, below 70 for those at very high risk.   HDL above 50 is good, above 60 is best.   Triglycerides below 150.  HOW CAN I LOWER MY CHOLESTEROL?  Diet. Follow your diet programs as directed by your health care provider.  Choose fish or white meat chicken and [place], roasted or baked. Limit fatty cuts of red meat, fried foods, and processed meats, such as sausage and lunch meats.   Eat lots of fresh fruits and vegetables.  Choose whole grains, beans, pasta, potatoes, and cereals.   Use only small amounts of olive, corn, or canola oils.   Avoid butter, mayonnaise, shortening, or palm kernel  oils.  Avoid foods with trans fats.   Drink skim or nonfat milk and eat low-fat or nonfat yogurt and cheeses. Avoid whole milk, cream, ice cream, egg yolks, and full-fat cheeses.   Healthy desserts include angel food cake, ginger snaps, animal crackers, hard candy, popsicles, and low-fat or nonfat frozen yogurt. Avoid pastries, cakes, pies, and cookies.   Exercise. Follow your exercise programs as directed by your health care provider.   A regular program helps decrease LDL and raise HDL.   A regular program helps with weight control.   Do things that increase your activity level like gardening, walking, or taking the stairs. Ask your health care provider about how you can be more active in your daily life.   Medicine. Take medicine only as directed by your health care provider.   Medicine may be prescribed by your health care provider to help lower cholesterol and decrease the risk for heart disease.   If you have several risk factors, you may need medicine even if your levels are normal.   This information is not intended to replace advice given to you by your health care provider. Make sure you discuss any questions you have with your health care provider.   Document Released: 09/15/2000 Document Revised: 01/11/2014 Document Reviewed: 10/04/2012 Elsevier Interactive Patient Education [employer].

## 2015-06-23 NOTE — Assessment & Plan Note (Signed)
DASH guidelines 

## 2015-06-23 NOTE — Assessment & Plan Note (Signed)
Check Cr 

## 2015-06-24 LAB — COMPLETE METABOLIC PANEL WITH GFR
ALBUMIN: 4.3 g/dL (ref 3.6–5.1)
ALT: 21 U/L (ref 6–29)
AST: 20 U/L (ref 10–35)
Alkaline Phosphatase: 63 U/L (ref 33–130)
BUN: 17 mg/dL (ref 7–25)
CHLORIDE: 101 mmol/L (ref 98–110)
CO2: 26 mmol/L (ref 20–31)
Calcium: 9.1 mg/dL (ref 8.6–10.4)
Creat: 0.72 mg/dL (ref 0.50–0.99)
GFR, EST NON AFRICAN AMERICAN: 88 mL/min (ref >=60)
GFR, Est African American: >89 mL/min (ref >=60)
GLUCOSE: 100 mg/dL — AB (ref 65–99)
Potassium: 4.6 mmol/L (ref 3.5–5.3)
SODIUM: 141 mmol/L (ref 135–146)
Total Bilirubin: 0.5 mg/dL (ref 0.2–1.2)
Total Protein: 7.1 g/dL (ref 6.1–8.1)

## 2015-06-24 LAB — TSH: TSH: 0.87 m[IU]/L

## 2015-06-24 LAB — LIPID PANEL
CHOLESTEROL: 193 mg/dL (ref 125–200)
HDL: 45 mg/dL — AB (ref >=46)
LDL CALC: 100 mg/dL (ref <130)
Total CHOL/HDL Ratio: 4.3 Ratio (ref <=5.0)
Triglycerides: 238 mg/dL — ABNORMAL HIGH (ref <150)
VLDL: 48 mg/dL — ABNORMAL HIGH (ref <30)

## 2015-06-24 LAB — HEPATITIS C ANTIBODY: HCV Ab: NEGATIVE

## 2015-07-01 ENCOUNTER — Other Ambulatory Visit: Payer: Self-pay | Admitting: Family Medicine

## 2015-07-01 MED ORDER — LEVOTHYROXINE SODIUM 150 MCG PO TABS: 150.0000 ug | ORAL_TABLET | Freq: Every day | ORAL | Status: DC

## 2015-07-03 DIAGNOSIS — E2839 Other primary ovarian failure: Secondary | ICD-10-CM | POA: Diagnosis not present

## 2015-07-03 DIAGNOSIS — Z780 Asymptomatic menopausal state: Secondary | ICD-10-CM | POA: Diagnosis not present

## 2015-07-03 DIAGNOSIS — Z1231 Encounter for screening mammogram for malignant neoplasm of breast: Secondary | ICD-10-CM | POA: Diagnosis not present

## 2015-07-11 DIAGNOSIS — R928 Other abnormal and inconclusive findings on diagnostic imaging of breast: Secondary | ICD-10-CM | POA: Diagnosis not present

## 2015-07-14 ENCOUNTER — Other Ambulatory Visit: Payer: Self-pay | Admitting: Family Medicine

## 2015-07-14 NOTE — Telephone Encounter (Signed)
CMP and lipid panel from June 2017 reviewed; Rxs approved

## 2015-07-17 ENCOUNTER — Other Ambulatory Visit: Payer: Self-pay

## 2015-07-18 MED ORDER — GABAPENTIN 300 MG PO CAPS: 300.0000 mg | ORAL_CAPSULE | Freq: Every day | ORAL | Status: DC

## 2015-07-21 ENCOUNTER — Telehealth: Payer: Self-pay | Admitting: Family Medicine

## 2015-07-21 DIAGNOSIS — R928 Other abnormal and inconclusive findings on diagnostic imaging of breast: Secondary | ICD-10-CM | POA: Insufficient documentation

## 2015-07-21 NOTE — Telephone Encounter (Signed)
Pt states already had additional imaging and everything turned out fine.  Also she wanted me to  Let u know she has stopped the gabapentin and has started the effexor.

## 2015-07-21 NOTE — Telephone Encounter (Signed)
Please contact pt, we see that she needs some additional imaging of her breast and we just want to make sure that has been ordered and scheduled; thank you

## 2015-07-25 DIAGNOSIS — D2112 Benign neoplasm of connective and other soft tissue of left upper limb, including shoulder: Secondary | ICD-10-CM | POA: Diagnosis not present

## 2015-07-28 ENCOUNTER — Other Ambulatory Visit: Payer: Self-pay | Admitting: Family Medicine

## 2015-07-29 DIAGNOSIS — D2112 Benign neoplasm of connective and other soft tissue of left upper limb, including shoulder: Secondary | ICD-10-CM | POA: Diagnosis not present

## 2015-07-29 DIAGNOSIS — D1801 Hemangioma of skin and subcutaneous tissue: Secondary | ICD-10-CM | POA: Diagnosis not present

## 2015-07-29 DIAGNOSIS — D3610 Benign neoplasm of peripheral nerves and autonomic nervous system, unspecified: Secondary | ICD-10-CM | POA: Diagnosis not present

## 2015-08-04 ENCOUNTER — Other Ambulatory Visit: Payer: Self-pay

## 2015-08-04 DIAGNOSIS — Z5181 Encounter for therapeutic drug level monitoring: Secondary | ICD-10-CM

## 2015-08-04 DIAGNOSIS — E890 Postprocedural hypothyroidism: Secondary | ICD-10-CM

## 2015-08-04 LAB — TSH: TSH: 3.28 m[IU]/L

## 2015-08-05 ENCOUNTER — Other Ambulatory Visit: Payer: Self-pay | Admitting: Family Medicine

## 2015-08-05 MED ORDER — LEVOTHYROXINE SODIUM 150 MCG PO TABS: 150.0000 ug | ORAL_TABLET | Freq: Every day | ORAL | 3 refills | Status: DC

## 2015-11-13 ENCOUNTER — Other Ambulatory Visit: Payer: Self-pay | Admitting: Family Medicine

## 2015-11-13 DIAGNOSIS — R232 Flushing: Secondary | ICD-10-CM

## 2015-11-13 NOTE — Telephone Encounter (Signed)
rx approved

## 2015-12-13 ENCOUNTER — Other Ambulatory Visit: Payer: Self-pay | Admitting: Family Medicine

## 2015-12-13 DIAGNOSIS — R232 Flushing: Secondary | ICD-10-CM

## 2015-12-13 NOTE — Telephone Encounter (Signed)
I approved a 6 month supply of venlafaxine to [facility] in [place] on 11/12/15; please resolve with pharmacy; she should not need more until May 2018 Thank you

## 2015-12-15 ENCOUNTER — Other Ambulatory Visit: Payer: Self-pay | Admitting: Family Medicine

## 2015-12-15 ENCOUNTER — Telehealth: Payer: Self-pay | Admitting: Family Medicine

## 2015-12-15 DIAGNOSIS — R232 Flushing: Secondary | ICD-10-CM

## 2015-12-15 NOTE — Telephone Encounter (Signed)
Call pharmacy and spoke with a Pharm tech; Mistake fax from the Pharmacy

## 2015-12-15 NOTE — Telephone Encounter (Signed)
I just approved a 6 month supply of venlafaxine on 11/13/15; please resolve with pharmacy; thank you

## 2016-01-06 ENCOUNTER — Encounter: Payer: Self-pay | Admitting: Family Medicine

## 2016-01-06 ENCOUNTER — Ambulatory Visit (INDEPENDENT_AMBULATORY_CARE_PROVIDER_SITE_OTHER): Payer: Medicare Other | Admitting: Family Medicine

## 2016-01-06 DIAGNOSIS — M5116 Intervertebral disc disorders with radiculopathy, lumbar region: Secondary | ICD-10-CM

## 2016-01-06 DIAGNOSIS — I10 Essential (primary) hypertension: Secondary | ICD-10-CM

## 2016-01-06 DIAGNOSIS — Z23 Encounter for immunization: Secondary | ICD-10-CM

## 2016-01-06 DIAGNOSIS — R232 Flushing: Secondary | ICD-10-CM

## 2016-01-06 DIAGNOSIS — M159 Polyosteoarthritis, unspecified: Secondary | ICD-10-CM

## 2016-01-06 DIAGNOSIS — K219 Gastro-esophageal reflux disease without esophagitis: Secondary | ICD-10-CM | POA: Diagnosis not present

## 2016-01-06 DIAGNOSIS — E890 Postprocedural hypothyroidism: Secondary | ICD-10-CM

## 2016-01-06 DIAGNOSIS — N951 Menopausal and female climacteric states: Secondary | ICD-10-CM | POA: Diagnosis not present

## 2016-01-06 DIAGNOSIS — E7800 Pure hypercholesterolemia, unspecified: Secondary | ICD-10-CM | POA: Diagnosis not present

## 2016-01-06 DIAGNOSIS — Z5181 Encounter for therapeutic drug level monitoring: Secondary | ICD-10-CM

## 2016-01-06 LAB — CBC WITH DIFFERENTIAL/PLATELET
BASOS PCT: 0 %
Basophils Absolute: 0 cells/uL (ref 0–200)
EOS PCT: 2 %
Eosinophils Absolute: 108 cells/uL (ref 15–500)
HCT: 41.9 % (ref 35.0–45.0)
Hemoglobin: 13.8 g/dL (ref 11.7–15.5)
LYMPHS PCT: 26 %
Lymphs Abs: 1404 cells/uL (ref 850–3900)
MCH: 31 pg (ref 27.0–33.0)
MCHC: 32.9 g/dL (ref 32.0–36.0)
MCV: 94.2 fL (ref 80.0–100.0)
MONOS PCT: 8 %
MPV: 10.4 fL (ref 7.5–12.5)
Monocytes Absolute: 432 cells/uL (ref 200–950)
NEUTROS ABS: 3456 {cells}/uL (ref 1500–7800)
Neutrophils Relative %: 64 %
PLATELETS: 279 10*3/uL (ref 140–400)
RBC: 4.45 MIL/uL (ref 3.80–5.10)
RDW: 13.6 % (ref 11.0–15.0)
WBC: 5.4 10*3/uL (ref 3.8–10.8)

## 2016-01-06 MED ORDER — VENLAFAXINE HCL ER 37.5 MG PO CP24: 37.5000 mg | ORAL_CAPSULE | Freq: Every day | ORAL | 3 refills | Status: DC

## 2016-01-06 MED ORDER — AMLODIPINE BESY-BENAZEPRIL HCL 10-20 MG PO CAPS: 1.0000 | ORAL_CAPSULE | Freq: Every day | ORAL | 3 refills | Status: DC

## 2016-01-06 MED ORDER — ATORVASTATIN CALCIUM 20 MG PO TABS: 20.0000 mg | ORAL_TABLET | Freq: Every day | ORAL | 3 refills | Status: DC

## 2016-01-06 NOTE — Patient Instructions (Addendum)
Try to use PLAIN allergy or cold medicine without the decongestant Avoid: phenylephrine, phenylpropanolamine, and pseudoephredine  Your goal blood pressure is less than 150 mmHg on top. Try to follow the DASH guidelines (DASH stands for Dietary Approaches to Stop Hypertension) Try to limit the sodium in your diet.  Ideally, consume less than 1.5 grams (less than 1,500mg ) per day. Do not add salt when cooking or at the table.  Check the sodium amount on labels when shopping, and choose items lower in sodium when given a choice. Avoid or limit foods that already contain a lot of sodium. Eat a diet rich in fruits and vegetables and whole grains.  Try to limit saturated fats in your diet (bologna, hot dogs, barbeque, cheeseburgers, hamburgers, steak, bacon, sausage, cheese, etc.) and get more fresh fruits, vegetables, and whole grains

## 2016-01-06 NOTE — Assessment & Plan Note (Signed)
Check lipids, oatmeal with blueberries and hot tea

## 2016-01-06 NOTE — Assessment & Plan Note (Signed)
Well-controlled, continue same

## 2016-01-06 NOTE — Assessment & Plan Note (Signed)
Not needing PT; managing on her own

## 2016-01-06 NOTE — Assessment & Plan Note (Signed)
Very sparing use of PPI; limiting triggers

## 2016-01-06 NOTE — Assessment & Plan Note (Signed)
Back and finger; using ice

## 2016-01-06 NOTE — Assessment & Plan Note (Signed)
Check TSH and adjust if needed 

## 2016-01-06 NOTE — Progress Notes (Signed)
BP 122/84   Pulse 88   Temp 98.4 F (36.9 C)   Resp 14   Wt 193 lb 3 oz (87.6 kg)   SpO2 96%   BMI 34.22 kg/m    Subjective:    Patient ID: Pamela Dodson, female    DOB: 22-Feb-1950, 66 y.o.   MRN: [id number]  HPI: Pamela Dodson is a 66 y.o. female  Chief Complaint  Patient presents with  . Follow-up    6 months    Patient is here for f/u Going through grief process, lost mother in April, difficult with holidays, first Christmas without her mother  SNRI really helping hot flashes; very pleased with the medicine's effects Did get flu shot  HTN; not checking away from here; controlled on medicine, same dose for years  OA in the finger, 4th finger left hand; she plays piano; careful with her back, bulging disk, hip, knees replaced; taking indomethacin BID from orthopaedist; putting off right hip replacement  High cholesterol; tries to avoid fatty meats; on statin  Thyroid issues; feels a little sluggish; no change really; has lost weight; food is not as exciting, attitude right now; aware of her weight, not doing any regular exercise, just active  GERD; not taking omeprazole but once a month; spicy and tomato based foods get her going, [place] foods  Depression screen [facility] 2/9 01/06/2016 06/23/2015 01/22/2015 08/07/2014  Decreased Interest 0 0 0 0  Down, Depressed, Hopeless 0 1 0 0  PHQ - 2 Score 0 1 0 0   Relevant past medical, surgical, family and social history reviewed Past Medical History:  Diagnosis Date  . Arthritis   . Blood transfusion   . Cancer (HCC)    hx thyroid cancer  . GERD (gastroesophageal reflux disease)   . Hypertension   . Hypothyroidism   . Neuromuscular disorder (HCC)    numbnes lower legs s/p knee replacements  . PONV (postoperative nausea and vomiting)    usually needs zofran/antiemetic prior to surgery  . Wears contact lenses    Past Surgical History:  Procedure Laterality Date  . APPENDECTOMY    . CARPAL TUNNEL RELEASE Left 2016    . CESAREAN SECTION    . CHOLECYSTECTOMY    . COLON SURGERY     diverticulitic mass removed  . COLONOSCOPY WITH PROPOFOL N/A 08/15/2014   Procedure: COLONOSCOPY WITH PROPOFOL;  Surgeon: Lucilla Lame, MD;  Location: [facility];  Service: Endoscopy;  Laterality: N/A;  . GANGLION CYST EXCISION     L wrist  . JOINT REPLACEMENT    . knee arthroscopy    . THYROIDECTOMY  2009  . TOTAL HIP ARTHROPLASTY  12/16/2010   Procedure: TOTAL HIP ARTHROPLASTY;  Surgeon: Kerin Salen;  Location: [facility];  Service: Orthopedics;  Laterality: Left;  Depuy  . TOTAL KNEE ARTHROPLASTY  2009   Family History  Problem Relation Age of Onset  . Cancer Mother     lung  . Myelodysplastic syndrome Mother   . Hypothyroidism Mother   . Hypertension Brother   . Birth defects Son     congenital genetic abnormality   Social History  Substance Use Topics  . Smoking status: Never Smoker  . Smokeless tobacco: Not on file  . Alcohol use 0.6 oz/week    1 Glasses of wine per week     Comment: occasional   Interim medical history since last visit reviewed. Allergies and medications reviewed  Review of Systems Per HPI unless specifically  indicated above     Objective:    BP 122/84   Pulse 88   Temp 98.4 F (36.9 C)   Resp 14   Wt 193 lb 3 oz (87.6 kg)   SpO2 96%   BMI 34.22 kg/m   Wt Readings from Last 3 Encounters:  01/06/16 193 lb 3 oz (87.6 kg)  06/23/15 208 lb (94.3 kg)  02/20/15 208 lb (94.3 kg)    Physical Exam  Constitutional: She appears well-developed and well-nourished. No distress.  Obese; weight down 15 pounds over last 6+ months  HENT:  Head: Normocephalic and atraumatic.  Eyes: EOM are normal. No scleral icterus.  Neck: No thyromegaly present.  Cardiovascular: Normal rate, regular rhythm and normal heart sounds.   No murmur heard. Pulmonary/Chest: Effort normal and breath sounds normal. No respiratory distress. She has no wheezes.  Abdominal: Soft. Bowel sounds are normal.  She exhibits no distension.  Musculoskeletal: Normal range of motion. She exhibits no edema.  Neurological: She is alert. She exhibits normal muscle tone.  Skin: Skin is warm and dry. She is not diaphoretic. No pallor.  Psychiatric: She has a normal mood and affect. Her behavior is normal. Judgment and thought content normal.      Assessment & Plan:   Problem List Items Addressed This Visit      Cardiovascular and Mediastinum   Hypertension goal BP (blood pressure) < 150/90    Well-controlled, continue same      Relevant Medications   atorvastatin (LIPITOR) 20 MG tablet   amLODipine-benazepril (LOTREL) 10-20 MG capsule     Digestive   Gastro-esophageal reflux disease without esophagitis    Very sparing use of PPI; limiting triggers        Endocrine   Hypothyroidism, postop    Check TSH and adjust if needed      Relevant Orders   TSH (Completed)     Nervous and Auditory   Lumbar disc disease with radiculopathy    Not needing PT; managing on her own      Relevant Medications   venlafaxine XR (EFFEXOR-XR) 37.5 MG 24 hr capsule     Musculoskeletal and Integument   Generalized OA    Back and finger; using ice        Other   Need for vaccination against Streptococcus pneumoniae using pneumococcal conjugate vaccine 7    PPSV-23 offered, given      Relevant Orders   Pneumococcal polysaccharide vaccine 23-valent greater than or equal to 2yo subcutaneous/IM (Completed)   Medication monitoring encounter    Check sgpt and creatinine      Relevant Orders   CBC with Differential/Platelet (Completed)   COMPLETE METABOLIC PANEL WITH GFR (Completed)   Hypercholesterolemia without hypertriglyceridemia    Check lipids, oatmeal with blueberries and hot tea      Relevant Medications   atorvastatin (LIPITOR) 20 MG tablet   amLODipine-benazepril (LOTREL) 10-20 MG capsule   Other Relevant Orders   Lipid panel (Completed)   Hot flash, menopausal    Much improved on  SNRI       Other Visit Diagnoses    Hot flashes       Relevant Medications   venlafaxine XR (EFFEXOR-XR) 37.5 MG 24 hr capsule   atorvastatin (LIPITOR) 20 MG tablet   amLODipine-benazepril (LOTREL) 10-20 MG capsule      Follow up plan: Return in about 6 months (around 07/05/2016) for fasting labs and visit.  An after-visit summary was printed and given to the  patient at [facility].  Please see the patient instructions which may contain other information and recommendations beyond what is mentioned above in the assessment and plan.  Meds ordered this encounter  Medications  . venlafaxine XR (EFFEXOR-XR) 37.5 MG 24 hr capsule    Sig: Take 1 capsule (37.5 mg total) by mouth daily with breakfast.    Dispense:  90 capsule    Refill:  3    **Patient requests 90 days supply**  . atorvastatin (LIPITOR) 20 MG tablet    Sig: Take 1 tablet (20 mg total) by mouth at bedtime. For cholesterol    Dispense:  90 tablet    Refill:  3  . amLODipine-benazepril (LOTREL) 10-20 MG capsule    Sig: Take 1 capsule by mouth daily. For blood pressure    Dispense:  90 capsule    Refill:  3    Orders Placed This Encounter  Procedures  . Pneumococcal polysaccharide vaccine 23-valent greater than or equal to 2yo subcutaneous/IM  . CBC with Differential/Platelet  . Lipid panel  . COMPLETE METABOLIC PANEL WITH GFR  . TSH   Refill thyroid to [facility] when results back --> done

## 2016-01-06 NOTE — Assessment & Plan Note (Signed)
PPSV-23 offered, given

## 2016-01-06 NOTE — Assessment & Plan Note (Signed)
Much improved on SNRI

## 2016-01-06 NOTE — Assessment & Plan Note (Signed)
Check sgpt and creatinine

## 2016-01-07 ENCOUNTER — Other Ambulatory Visit: Payer: Self-pay | Admitting: Family Medicine

## 2016-01-07 ENCOUNTER — Encounter: Payer: Self-pay | Admitting: Family Medicine

## 2016-01-07 LAB — COMPLETE METABOLIC PANEL WITH GFR
ALBUMIN: 4.4 g/dL (ref 3.6–5.1)
ALK PHOS: 60 U/L (ref 33–130)
ALT: 15 U/L (ref 6–29)
AST: 18 U/L (ref 10–35)
BILIRUBIN TOTAL: 0.5 mg/dL (ref 0.2–1.2)
BUN: 18 mg/dL (ref 7–25)
CALCIUM: 9.4 mg/dL (ref 8.6–10.4)
CO2: 27 mmol/L (ref 20–31)
Chloride: 101 mmol/L (ref 98–110)
Creat: 0.76 mg/dL (ref 0.50–0.99)
GFR, EST NON AFRICAN AMERICAN: 82 mL/min (ref >=60)
GFR, Est African American: >89 mL/min (ref >=60)
GLUCOSE: 102 mg/dL — AB (ref 65–99)
POTASSIUM: 4.6 mmol/L (ref 3.5–5.3)
Sodium: 142 mmol/L (ref 135–146)
TOTAL PROTEIN: 7.2 g/dL (ref 6.1–8.1)

## 2016-01-07 LAB — LIPID PANEL
CHOL/HDL RATIO: 3.6 ratio (ref <5.0)
CHOLESTEROL: 180 mg/dL (ref <200)
HDL: 50 mg/dL — AB (ref >50)
LDL Cholesterol: 100 mg/dL — ABNORMAL HIGH (ref <100)
Triglycerides: 151 mg/dL — ABNORMAL HIGH (ref <150)
VLDL: 30 mg/dL (ref <30)

## 2016-01-07 LAB — TSH: TSH: 2.37 m[IU]/L

## 2016-01-07 NOTE — Telephone Encounter (Signed)
Duplicate requests for amlo/benaz and atorvastatin Maybe patient switched pharmacies Rxs sent as requested

## 2016-01-08 MED ORDER — LEVOTHYROXINE SODIUM 150 MCG PO TABS: 150.0000 ug | ORAL_TABLET | Freq: Every day | ORAL | 3 refills | Status: DC

## 2016-01-09 DIAGNOSIS — M65342 Trigger finger, left ring finger: Secondary | ICD-10-CM | POA: Diagnosis not present

## 2016-01-09 DIAGNOSIS — M19041 Primary osteoarthritis, right hand: Secondary | ICD-10-CM | POA: Diagnosis not present

## 2016-01-09 DIAGNOSIS — D2112 Benign neoplasm of connective and other soft tissue of left upper limb, including shoulder: Secondary | ICD-10-CM | POA: Diagnosis not present

## 2016-05-28 NOTE — Telephone Encounter (Signed)
ERRENOUS °

## 2016-07-05 ENCOUNTER — Ambulatory Visit: Payer: Medicare Other | Admitting: Family Medicine

## 2016-07-12 ENCOUNTER — Encounter: Payer: Self-pay | Admitting: Family Medicine

## 2016-07-12 ENCOUNTER — Ambulatory Visit (INDEPENDENT_AMBULATORY_CARE_PROVIDER_SITE_OTHER): Payer: Medicare Other | Admitting: Family Medicine

## 2016-07-12 VITALS — BP 124/80 | HR 86 | Temp 98.5°F | Resp 14 | Wt 195.1 lb

## 2016-07-12 DIAGNOSIS — M1610 Unilateral primary osteoarthritis, unspecified hip: Secondary | ICD-10-CM | POA: Diagnosis not present

## 2016-07-12 DIAGNOSIS — Z5181 Encounter for therapeutic drug level monitoring: Secondary | ICD-10-CM

## 2016-07-12 DIAGNOSIS — E7800 Pure hypercholesterolemia, unspecified: Secondary | ICD-10-CM

## 2016-07-12 DIAGNOSIS — K219 Gastro-esophageal reflux disease without esophagitis: Secondary | ICD-10-CM

## 2016-07-12 DIAGNOSIS — I10 Essential (primary) hypertension: Secondary | ICD-10-CM | POA: Diagnosis not present

## 2016-07-12 DIAGNOSIS — E6609 Other obesity due to excess calories: Secondary | ICD-10-CM | POA: Diagnosis not present

## 2016-07-12 DIAGNOSIS — E890 Postprocedural hypothyroidism: Secondary | ICD-10-CM

## 2016-07-12 DIAGNOSIS — R7303 Prediabetes: Secondary | ICD-10-CM | POA: Insufficient documentation

## 2016-07-12 DIAGNOSIS — Z6834 Body mass index (BMI) 34.0-34.9, adult: Secondary | ICD-10-CM

## 2016-07-12 DIAGNOSIS — Z1231 Encounter for screening mammogram for malignant neoplasm of breast: Secondary | ICD-10-CM

## 2016-07-12 DIAGNOSIS — Z1239 Encounter for other screening for malignant neoplasm of breast: Secondary | ICD-10-CM

## 2016-07-12 LAB — POCT GLYCOSYLATED HEMOGLOBIN (HGB A1C): HEMOGLOBIN A1C: 5.6

## 2016-07-12 NOTE — Progress Notes (Signed)
BP 124/80   Pulse 86   Temp 98.5 F (36.9 C) (Oral)   Resp 14   Wt 195 lb 1.6 oz (88.5 kg)   SpO2 96%   BMI 34.56 kg/m    Subjective:    Patient ID: Pamela Dodson, female    DOB: July 17, 1950, 66 y.o.   MRN: [id number]  HPI: Pamela Dodson is a 66 y.o. female  Chief Complaint  Patient presents with  . Follow-up    6 months  . Medication Refill   HPI Patient is here for f/u Hypothyroidism; weight stable, energy is good, moving bowels okay Lab Results  Component Value Date   TSH 2.37 01/06/2016   Prediabetes; no one in the family has diabetes; no dry mouth or urinary frequency; limiting sweets; drinks are easy to limit  High cholesterol; no side effects; TG really came down more than 110 points;   Obesity; nothing specific, just eating less Father's side is stocky and she's built like that side; mother and brother lean  HTN; not checking BP away from doctor; stable for years  Not taking PPI; taken off list by MD; [place] foods will cause reflux; no Barretts and no blood in stool  Depression screen [facility] 2/9 07/12/2016 01/06/2016 06/23/2015 01/22/2015 08/07/2014  Decreased Interest 0 0 0 0 0  Down, Depressed, Hopeless 0 0 1 0 0  PHQ - 2 Score 0 0 1 0 0    Relevant past medical, surgical, family and social history reviewed Past Medical History:  Diagnosis Date  . Arthritis   . Blood transfusion   . Cancer (HCC)    hx thyroid cancer  . GERD (gastroesophageal reflux disease)   . Hypertension   . Hypothyroidism   . Neuromuscular disorder (HCC)    numbnes lower legs s/p knee replacements  . PONV (postoperative nausea and vomiting)    usually needs zofran/antiemetic prior to surgery  . Wears contact lenses    Past Surgical History:  Procedure Laterality Date  . APPENDECTOMY    . CARPAL TUNNEL RELEASE Left 2016  . CESAREAN SECTION    . CHOLECYSTECTOMY    . COLON SURGERY     diverticulitic mass removed  . COLONOSCOPY WITH PROPOFOL N/A 08/15/2014   Procedure:  COLONOSCOPY WITH PROPOFOL;  Surgeon: Lucilla Lame, MD;  Location: [place];  Service: Endoscopy;  Laterality: N/A;  . GANGLION CYST EXCISION     L wrist  . JOINT REPLACEMENT    . knee arthroscopy    . THYROIDECTOMY  2009  . TOTAL HIP ARTHROPLASTY  12/16/2010   Procedure: TOTAL HIP ARTHROPLASTY;  Surgeon: Kerin Salen;  Location: [place];  Service: Orthopedics;  Laterality: Left;  Depuy  . TOTAL KNEE ARTHROPLASTY  2009   Family History  Problem Relation Age of Onset  . Cancer Mother        lung  . Myelodysplastic syndrome Mother   . Hypothyroidism Mother   . Hypertension Brother   . Stroke Brother   . Birth defects Son        congenital genetic abnormality  . Dementia Maternal Grandfather   . Heart disease Paternal Grandmother   . Stroke Paternal Grandmother   . Heart attack Paternal Grandfather    Social History   Social History  . Marital status: Married    Spouse name: N/A  . Number of children: N/A  . Years of education: N/A   Occupational History  . Not on file.   Social History  Main Topics  . Smoking status: Never Smoker  . Smokeless tobacco: Never Used  . Alcohol use 0.6 oz/week    1 Glasses of wine per week     Comment: occasional  . Drug use: No  . Sexual activity: Yes    Partners: Male   Other Topics Concern  . Not on file   Social History Narrative  . No narrative on file    Interim medical history since last visit reviewed. Allergies and medications reviewed  Review of Systems Per HPI unless specifically indicated above     Objective:    BP 124/80   Pulse 86   Temp 98.5 F (36.9 C) (Oral)   Resp 14   Wt 195 lb 1.6 oz (88.5 kg)   SpO2 96%   BMI 34.56 kg/m   Wt Readings from Last 3 Encounters:  07/12/16 195 lb 1.6 oz (88.5 kg)  01/06/16 193 lb 3 oz (87.6 kg)  06/23/15 208 lb (94.3 kg)    Physical Exam  Constitutional: She appears well-developed and well-nourished. No distress.  HENT:  Head: Normocephalic and atraumatic.    Eyes: EOM are normal. No scleral icterus.  Neck: No thyromegaly present.  Cardiovascular: Normal rate and regular rhythm.   Pulmonary/Chest: Effort normal and breath sounds normal. She has no wheezes.  Abdominal: Soft. Bowel sounds are normal. She exhibits no distension.  Musculoskeletal: She exhibits no edema.  Neurological: She is alert. She exhibits normal muscle tone.  Skin: Skin is warm and dry. No pallor.  Psychiatric: She has a normal mood and affect. Her behavior is normal. Judgment and thought content normal.    Results for orders placed or performed in visit on 07/12/16  POCT HgB A1C  Result Value Ref Range   Hemoglobin A1C 5.6       Assessment & Plan:   Problem List Items Addressed This Visit      Cardiovascular and Mediastinum   Hypertension goal BP (blood pressure) < 150/90 - Primary    Well-controlled; continue same, try to follow DASH guidelines        Digestive   Gastro-esophageal reflux disease without esophagitis    Cautioned about prolonged use of PPI medicines; suggested H2 blocker instead, since no ulcer and no barretts; call if symptoms worse        Endocrine   Hypothyroidism, postop    Stable, continue medicine, check TSH once a year        Musculoskeletal and Integument   Arthritis of hip (Chronic)    Helped with medicine; seeing orthopaedist; delaying surgery if possible; already had other hip and knees done      Relevant Medications   indomethacin (INDOCIN) 25 MG capsule     Other   Prediabetes    Check A1c; limit sweets      Relevant Orders   POCT HgB A1C (Completed)   Medication monitoring encounter    Last liver and kidney function reviewed, normal      Hypercholesterolemia without hypertriglyceridemia    Check yearly; limit saturated fats      Adiposity    Encouragement given for ongoing modest weight loss       Other Visit Diagnoses    Screening for breast cancer       Relevant Orders   MM Digital Screening        Follow up plan: Return in about 6 months (around 01/12/2017) for twenty minute follow-up with fasting labs; Medicare wellness visit when due.  An after-visit  summary was printed and given to the patient at [facility].  Please see the patient instructions which may contain other information and recommendations beyond what is mentioned above in the assessment and plan.  Meds ordered this encounter  Medications  . indomethacin (INDOCIN) 25 MG capsule    Sig: Take 25 mg by mouth 2 (two) times daily.    Orders Placed This Encounter  Procedures  . MM Digital Screening  . POCT HgB A1C

## 2016-07-12 NOTE — Assessment & Plan Note (Signed)
Encouragement given for ongoing modest weight loss

## 2016-07-12 NOTE — Patient Instructions (Addendum)
Caution: prolonged use of proton pump inhibitors like omeprazole (Prilosec), pantoprazole (Protonix), esomeprazole (Nexium), and others like Dexilant and Aciphex may increase your risk of pneumonia, Clostridium difficile colitis, osteoporosis, anemia and other health complications Try to limit or avoid triggers like coffee, caffeinated beverages, onions, chocolate, spicy foods, peppermint, acid foods like pizza, spaghetti sauce, and orange juice Lose weight if you are overweight or obese Try elevating the head of your bed by placing a small wedge between your mattress and box springs to keep acid in the stomach at night instead of coming up into your esophagus Zantac can be taken for symptoms or before known trigger foods

## 2016-07-12 NOTE — Assessment & Plan Note (Signed)
Helped with medicine; seeing orthopaedist; delaying surgery if possible; already had other hip and knees done

## 2016-07-12 NOTE — Assessment & Plan Note (Signed)
Last liver and kidney function reviewed, normal

## 2016-07-12 NOTE — Assessment & Plan Note (Signed)
Stable, continue medicine, check TSH once a year

## 2016-07-12 NOTE — Assessment & Plan Note (Signed)
Check yearly; limit saturated fats

## 2016-07-12 NOTE — Assessment & Plan Note (Signed)
Check A1c; limit sweets

## 2016-07-12 NOTE — Assessment & Plan Note (Signed)
Well-controlled; continue same, try to follow DASH guidelines

## 2016-07-15 NOTE — Assessment & Plan Note (Signed)
Cautioned about prolonged use of PPI medicines; suggested H2 blocker instead, since no ulcer and no barretts; call if symptoms worse

## 2016-10-11 ENCOUNTER — Other Ambulatory Visit: Payer: Self-pay | Admitting: Family Medicine

## 2016-10-11 DIAGNOSIS — R232 Flushing: Secondary | ICD-10-CM

## 2016-10-11 NOTE — Telephone Encounter (Signed)
Spoke to [employer], Restaurant manager, fast food from [employer]. She explain to me that the pt has picked up their last refill and they will need a new RX on the 10-29-2016. She states they send out Rx refill couple weeks in advance because the process and or delivery can take up to a week. Fabio Bering states in order to do a year worth of refill it needs to be 4 refills instead of 3.

## 2016-10-11 NOTE — Telephone Encounter (Signed)
I prescribed a one year supply of the venlafaxine on Jan 06, 2016 to [facility]; she shouldn't be out yet Please resolve with pharmacy

## 2016-10-14 ENCOUNTER — Encounter: Payer: Self-pay | Admitting: Family Medicine

## 2016-10-15 ENCOUNTER — Other Ambulatory Visit: Payer: Self-pay | Admitting: Family Medicine

## 2016-10-15 DIAGNOSIS — R232 Flushing: Secondary | ICD-10-CM

## 2016-10-15 NOTE — Telephone Encounter (Signed)
She should have enough until jan 2019; declined

## 2016-10-28 ENCOUNTER — Encounter: Payer: Medicare Other | Admitting: Family Medicine

## 2016-11-02 ENCOUNTER — Encounter: Payer: Self-pay | Admitting: Family Medicine

## 2016-11-02 ENCOUNTER — Ambulatory Visit (INDEPENDENT_AMBULATORY_CARE_PROVIDER_SITE_OTHER): Payer: Medicare Other | Admitting: Family Medicine

## 2016-11-02 DIAGNOSIS — Z0000 Encounter for general adult medical examination without abnormal findings: Secondary | ICD-10-CM

## 2016-11-02 DIAGNOSIS — Z789 Other specified health status: Secondary | ICD-10-CM

## 2016-11-02 HISTORY — DX: Other specified health status: Z78.9

## 2016-11-02 MED ORDER — ATORVASTATIN CALCIUM 40 MG PO TABS: 40.0000 mg | ORAL_TABLET | Freq: Every day | ORAL | 3 refills | Status: DC

## 2016-11-02 NOTE — Progress Notes (Signed)
Patient: Pamela Dodson, Female    DOB: 1950-08-16, 66 y.o.   MRN: [id number]  Visit Date: 11/02/2016  Today's Provider: Enid Derry, MD   Chief Complaint  Patient presents with  . Medicare Wellness    Subjective:   Pamela Dodson is a 66 y.o. female who presents today for her Subsequent Annual Wellness Visit.  Caregiver input: n/a  USPSTF grade A and B recommendations Depression:  Depression screen [facility] 2/9 11/02/2016 07/12/2016 01/06/2016 06/23/2015 01/22/2015  Decreased Interest 0 0 0 0 0  Down, Depressed, Hopeless 0 0 0 1 0  PHQ - 2 Score 0 0 0 1 0   Hypertension: BP Readings from Last 3 Encounters:  11/02/16 122/72  07/12/16 124/80  01/06/16 122/84   Obesity: picking up weight Wt Readings from Last 3 Encounters:  11/02/16 198 lb 8 oz (90 kg)  07/12/16 195 lb 1.6 oz (88.5 kg)  01/06/16 193 lb 3 oz (87.6 kg)   BMI Readings from Last 3 Encounters:  11/02/16 35.59 kg/m  07/12/16 34.56 kg/m  01/06/16 34.22 kg/m    Skin cancer: no worrisome moles Lung cancer:  nonsmoker Breast cancer: just done; next is one year; they did more extensive testing last year; no fam hx Colorectal cancer: no fam hx; last colonoscopy 2016; next due in 2026  BRCA gene screening: family hx of breast and/or ovarian cancer and/or metastatic prostate cancer?  Cervical cancer screening:  HIV, hep B, hep C: done; not interested in other STD testing and prevention (chl/gon/syphilis): not interested  Intimate partner violence: no abuse Osteoporosis: June 2017 Fall prevention/vitamin D: discussed, 3 servings a day of dietary calcium  Diet: picked up weight since last visit; eating a little heavier; will work on that; not many saturated fats; mostly chicken and fish; could eat very healthy by herself; not many eggs at all; weeks in between; does eat yogurt, blueberries, green tea, low fat milk Exercise: not exercising because of her joint issues; stalling hip replacement on the right side;  indomethacin helping, not limping and hurting, bone on bone; left hip replacement years ago; has a ruptured disc in back, has to be careful; she thinks picking up would help Alcohol: occasional Tobacco use: never smoker Aspirin: taking AAA: nonsmoker Brother just had first episode of A-fib; also had stroke, built differently Lipids:  Lab Results  Component Value Date   CHOL 180 01/06/2016   CHOL 193 06/23/2015   CHOL 193 08/14/2014   Lab Results  Component Value Date   HDL 50 (L) 01/06/2016   HDL 45 (L) 06/23/2015   HDL 41 08/14/2014   Lab Results  Component Value Date   LDLCALC 100 (H) 01/06/2016   LDLCALC 100 06/23/2015   LDLCALC 100 (H) 08/14/2014   Lab Results  Component Value Date   TRIG 151 (H) 01/06/2016   TRIG 238 (H) 06/23/2015   TRIG 262 (H) 08/14/2014   Lab Results  Component Value Date   CHOLHDL 3.6 01/06/2016   CHOLHDL 4.3 06/23/2015   CHOLHDL 4.7 (H) 08/14/2014   No results found for: LDLDIRECT Glucose:  Glucose  Date Value Ref Range Status  08/14/2014 108 (H) 65 - 99 mg/dL Final   Glucose, Bld  Date Value Ref Range Status  01/06/2016 102 (H) 65 - 99 mg/dL Final  06/23/2015 100 (H) 65 - 99 mg/dL Final  12/17/2010 139 (H) 70 - 99 mg/dL Final    HPI  Review of Systems  Cardiovascular: Negative for chest pain, palpitations and leg  swelling.   Past Medical History:  Diagnosis Date  . Arthritis   . Blood transfusion   . Cancer (HCC)    hx thyroid cancer  . GERD (gastroesophageal reflux disease)   . Hypertension   . Hypothyroidism   . Neuromuscular disorder (HCC)    numbnes lower legs s/p knee replacements  . PONV (postoperative nausea and vomiting)    usually needs zofran/antiemetic prior to surgery  . Wears contact lenses     Past Surgical History:  Procedure Laterality Date  . APPENDECTOMY    . CARPAL TUNNEL RELEASE Left 2016  . CESAREAN SECTION    . CHOLECYSTECTOMY    . COLON SURGERY     diverticulitic mass removed  .  COLONOSCOPY WITH PROPOFOL N/A 08/15/2014   Procedure: COLONOSCOPY WITH PROPOFOL;  Surgeon: Lucilla Lame, MD;  Location: [place];  Service: Endoscopy;  Laterality: N/A;  . GANGLION CYST EXCISION     L wrist  . JOINT REPLACEMENT    . knee arthroscopy    . THYROIDECTOMY  2009  . TOTAL HIP ARTHROPLASTY  12/16/2010   Procedure: TOTAL HIP ARTHROPLASTY;  Surgeon: Kerin Salen;  Location: [place];  Service: Orthopedics;  Laterality: Left;  Depuy  . TOTAL KNEE ARTHROPLASTY  2009    Family History  Problem Relation Age of Onset  . Cancer Mother        lung  . Myelodysplastic syndrome Mother   . Hypothyroidism Mother   . Hypertension Brother   . Stroke Brother   . Birth defects Son        congenital genetic abnormality  . Dementia Maternal Grandfather   . Heart disease Paternal Grandmother   . Stroke Paternal Grandmother   . Heart attack Paternal Grandfather     Social History   Social History  . Marital status: Married    Spouse name: N/A  . Number of children: N/A  . Years of education: N/A   Occupational History  . Not on file.   Social History Main Topics  . Smoking status: Never Smoker  . Smokeless tobacco: Never Used  . Alcohol use 0.6 oz/week    1 Glasses of wine per week     Comment: occasional  . Drug use: No  . Sexual activity: Yes    Partners: Male   Other Topics Concern  . Not on file   Social History Narrative  . No narrative on file    Outpatient Encounter Prescriptions as of 11/02/2016  Medication Sig Note  . amLODipine-benazepril (LOTREL) 10-20 MG capsule Take 1 capsule by mouth daily. For blood pressure   . Ascorbic Acid (VITAMIN C) 100 MG tablet Take 100 mg by mouth daily.   Marland Kitchen atorvastatin (LIPITOR) 20 MG tablet Take 1 tablet (20 mg total) by mouth at bedtime. For cholesterol   . Black Cohosh (REMIFEMIN PO) Take 1 tablet by mouth 2 (two) times daily. Over the counter medication. For hot flashes.    . Cholecalciferol 1000 UNITS capsule Take  1 capsule by mouth daily. 08/07/2014: Received from: [place]: Take by mouth.  . indomethacin (INDOCIN) 25 MG capsule Take 25 mg by mouth 2 (two) times daily.   Marland Kitchen levothyroxine (SYNTHROID, LEVOTHROID) 150 MCG tablet Take 1 tablet (150 mcg total) by mouth daily.   . Multiple Vitamin (MULTIVITAMIN) tablet Take 1 tablet by mouth daily.   . Omega-3 Fatty Acids (FISH OIL) 1000 MG CPDR Take 2 capsules by mouth daily.  08/07/2014: Received  from: [place]: Take by mouth.  . venlafaxine XR (EFFEXOR-XR) 37.5 MG 24 hr capsule Take 1 capsule (37.5 mg total) by mouth daily with breakfast.   . [DISCONTINUED] Bioflavonoid Products (VITAMIN C) CHEW Chew 1 capsule by mouth daily. 08/07/2014: Received from: [place]: Chew by mouth.   No facility-administered encounter medications on file as of 11/02/2016.     Functional Ability / Safety Screening 1.  Was the timed Get Up and Go test longer than 30 seconds?  no 2.  Does the patient need help with the phone, transportation, shopping,      preparing meals, housework, laundry, medications, or managing money?  no 3.  Does the patient's home have:  loose throw rugs in the hallway?   no      Grab bars in the bathroom? no      Handrails on the stairs?   yes      Poor lighting?   no 4.  Has the patient noticed any hearing difficulties?   no  Fall Risk Assessment See under rooming  Depression Screen See under rooming Depression screen [facility] 2/9 11/02/2016 07/12/2016 01/06/2016 06/23/2015 01/22/2015  Decreased Interest 0 0 0 0 0  Down, Depressed, Hopeless 0 0 0 1 0  PHQ - 2 Score 0 0 0 1 0    Advanced Directives Does patient have a HCPOA?    no If yes, name and contact information: Erik Obey, same home # Does patient have a living will or MOST form?  no  Full code  Objective:   Vitals: BP 122/72   Pulse 87   Temp 97.8 F (36.6 C) (Oral)   Resp 14   Ht 5'  2.63" (1.591 m)   Wt 198 lb 8 oz (90 kg)   SpO2 97%   BMI 35.59 kg/m  Body mass index is 35.59 kg/m. No exam data present  Physical Exam Mood/affect:  Pleasant, euthymic Appearance:  Neatly dressed, good hygiene  No flowsheet data found.  Assessment & Plan:     Annual Wellness Visit  Reviewed patient's Family Medical History Reviewed and updated list of patient's medical providers Assessment of cognitive impairment was done Assessed patient's functional ability Established a written schedule for health screening [facility] Completed and Reviewed  Exercise Activities and Dietary recommendations Goals    None      Immunization History  Administered Date(s) Administered  . Influenza-Unspecified 09/14/2016  . Pneumococcal Conjugate-13 08/07/2014  . Pneumococcal Polysaccharide-23 01/06/2016  . Tdap 01/04/2009  . Zoster 03/02/2012    Health Maintenance  Topic Date Due  . DEXA SCAN  07/02/2017  . MAMMOGRAM  10/13/2017  . TETANUS/TDAP  01/05/2019  . COLONOSCOPY  08/14/2024  . INFLUENZA VACCINE  Completed  . Hepatitis C Screening  Completed  . PNA vac Low Risk Adult  Completed    Discussed health benefits of physical activity, and encouraged her to engage in regular exercise appropriate for her age and condition.   Meds ordered this encounter  Medications  . Ascorbic Acid (VITAMIN C) 100 MG tablet    Sig: Take 100 mg by mouth daily.  . Multiple Vitamin (MULTIVITAMIN) tablet    Sig: Take 1 tablet by mouth daily.    Current Outpatient Prescriptions:  .  amLODipine-benazepril (LOTREL) 10-20 MG capsule, Take 1 capsule by mouth daily. For blood pressure, Disp: 90 capsule, Rfl: 3 .  Ascorbic Acid (VITAMIN C) 100 MG tablet, Take 100 mg by  mouth daily., Disp: , Rfl:  .  atorvastatin (LIPITOR) 20 MG tablet, Take 1 tablet (20 mg total) by mouth at bedtime. For cholesterol, Disp: 90 tablet, Rfl: 3 .  Black Cohosh (REMIFEMIN PO), Take 1 tablet by  mouth 2 (two) times daily. Over the counter medication. For hot flashes. , Disp: , Rfl:  .  Cholecalciferol 1000 UNITS capsule, Take 1 capsule by mouth daily., Disp: , Rfl:  .  indomethacin (INDOCIN) 25 MG capsule, Take 25 mg by mouth 2 (two) times daily., Disp: , Rfl:  .  levothyroxine (SYNTHROID, LEVOTHROID) 150 MCG tablet, Take 1 tablet (150 mcg total) by mouth daily., Disp: 90 tablet, Rfl: 3 .  Multiple Vitamin (MULTIVITAMIN) tablet, Take 1 tablet by mouth daily., Disp: , Rfl:  .  Omega-3 Fatty Acids (FISH OIL) 1000 MG CPDR, Take 2 capsules by mouth daily. , Disp: , Rfl:  .  venlafaxine XR (EFFEXOR-XR) 37.5 MG 24 hr capsule, Take 1 capsule (37.5 mg total) by mouth daily with breakfast., Disp: 90 capsule, Rfl: 3 Medications Discontinued During This Encounter  Medication Reason  . Bioflavonoid Products (VITAMIN C) CHEW Error    Next Medicare Wellness Visit in 12+ months  Problem List Items Addressed This Visit      Other   Preventative health care    USPSTF grade A and B recommendations reviewed with patient; age-appropriate recommendations, preventive care, screening tests, etc discussed and encouraged; healthy living encouraged; see AVS for patient education given to patient       Full code status    Full code status; paperwork given for official HCPOA and living will      Relevant Orders   Full code

## 2016-11-02 NOTE — Assessment & Plan Note (Signed)
USPSTF grade A and B recommendations reviewed with patient; age-appropriate recommendations, preventive care, screening tests, etc discussed and encouraged; healthy living encouraged; see AVS for patient education given to patient  

## 2016-11-02 NOTE — Patient Instructions (Addendum)
Please make sure your daily aspirin is coated You'll be due for your next bone density test on or after July 03, 2017 Please do increase your atorvastatin from 20 mg to 40 mg every night Return for fasting cholesterol and visit in January  Health Maintenance  Topic Date Due  . DEXA SCAN  07/02/2017  . MAMMOGRAM  10/13/2017  . TETANUS/TDAP  01/05/2019  . COLONOSCOPY  08/14/2024  . INFLUENZA VACCINE  Completed  . Hepatitis C Screening  Completed  . PNA vac Low Risk Adult  Completed   Health Maintenance, Female Adopting a healthy lifestyle and getting preventive care can go a long way to promote health and wellness. Talk with your health care provider about what schedule of regular examinations is right for you. This is a good chance for you to check in with your provider about disease prevention and staying healthy. In between checkups, there are plenty of things you can do on your own. Experts have done a lot of research about which lifestyle changes and preventive measures are most likely to keep you healthy. Ask your health care provider for more information. Weight and diet Eat a healthy diet  Be sure to include plenty of vegetables, fruits, low-fat dairy products, and lean protein.  Do not eat a lot of foods high in solid fats, added sugars, or salt.  Get regular exercise. This is one of the most important things you can do for your health. ? Most adults should exercise for at least 150 minutes each week. The exercise should increase your heart rate and make you sweat (moderate-intensity exercise). ? Most adults should also do strengthening exercises at least twice a week. This is in addition to the moderate-intensity exercise.  Maintain a healthy weight  Body mass index (BMI) is a measurement that can be used to identify possible weight problems. It estimates body fat based on height and weight. Your health care provider can help determine your BMI and help you achieve or maintain  a healthy weight.  For females 10 years of age and older: ? A BMI below 18.5 is considered underweight. ? A BMI of 18.5 to 24.9 is normal. ? A BMI of 25 to 29.9 is considered overweight. ? A BMI of 30 and above is considered obese.  Watch levels of cholesterol and blood lipids  You should start having your blood tested for lipids and cholesterol at 67 years of age, then have this test every 5 years.  You may need to have your cholesterol levels checked more often if: ? Your lipid or cholesterol levels are high. ? You are older than 67 years of age. ? You are at high risk for heart disease.  Cancer screening Lung Cancer  Lung cancer screening is recommended for adults 85-49 years old who are at high risk for lung cancer because of a history of smoking.  A yearly low-dose CT scan of the lungs is recommended for people who: ? Currently smoke. ? Have quit within the past 15 years. ? Have at least a 30-pack-year history of smoking. A pack year is smoking an average of one pack of cigarettes a day for 1 year.  Yearly screening should continue until it has been 15 years since you quit.  Yearly screening should stop if you develop a health problem that would prevent you from having lung cancer treatment.  Breast Cancer  Practice breast self-awareness. This means understanding how your breasts normally appear and feel.  It also means  doing regular breast self-exams. Let your health care provider know about any changes, no matter how small.  If you are in your 20s or 30s, you should have a clinical breast exam (CBE) by a health care provider every 1-3 years as part of a regular health exam.  If you are 7 or older, have a CBE every year. Also consider having a breast X-ray (mammogram) every year.  If you have a family history of breast cancer, talk to your health care provider about genetic screening.  If you are at high risk for breast cancer, talk to your health care provider about  having an MRI and a mammogram every year.  Breast cancer gene (BRCA) assessment is recommended for women who have family members with BRCA-related cancers. BRCA-related cancers include: ? Breast. ? Ovarian. ? Tubal. ? Peritoneal cancers.  Results of the assessment will determine the need for genetic counseling and BRCA1 and BRCA2 testing.  Cervical Cancer Your health care provider may recommend that you be screened regularly for cancer of the pelvic organs (ovaries, uterus, and vagina). This screening involves a pelvic examination, including checking for microscopic changes to the surface of your cervix (Pap test). You may be encouraged to have this screening done every 3 years, beginning at age 3.  For women ages 36-65, health care providers may recommend pelvic exams and Pap testing every 3 years, or they may recommend the Pap and pelvic exam, combined with testing for human papilloma virus (HPV), every 5 years. Some types of HPV increase your risk of cervical cancer. Testing for HPV may also be done on women of any age with unclear Pap test results.  Other health care providers may not recommend any screening for nonpregnant women who are considered low risk for pelvic cancer and who do not have symptoms. Ask your health care provider if a screening pelvic exam is right for you.  If you have had past treatment for cervical cancer or a condition that could lead to cancer, you need Pap tests and screening for cancer for at least 20 years after your treatment. If Pap tests have been discontinued, your risk factors (such as having a new sexual partner) need to be reassessed to determine if screening should resume. Some women have medical problems that increase the chance of getting cervical cancer. In these cases, your health care provider may recommend more frequent screening and Pap tests.  Colorectal Cancer  This type of cancer can be detected and often prevented.  Routine colorectal cancer  screening usually begins at 67 years of age and continues through 67 years of age.  Your health care provider may recommend screening at an earlier age if you have risk factors for colon cancer.  Your health care provider may also recommend using home test kits to check for hidden blood in the stool.  A small camera at the end of a tube can be used to examine your colon directly (sigmoidoscopy or colonoscopy). This is done to check for the earliest forms of colorectal cancer.  Routine screening usually begins at age 24.  Direct examination of the colon should be repeated every 5-10 years through 67 years of age. However, you may need to be screened more often if early forms of precancerous polyps or small growths are found.  Skin Cancer  Check your skin from head to toe regularly.  Tell your health care provider about any new moles or changes in moles, especially if there is a change in a  mole's shape or color.  Also tell your health care provider if you have a mole that is larger than the size of a pencil eraser.  Always use sunscreen. Apply sunscreen liberally and repeatedly throughout the day.  Protect yourself by wearing long sleeves, pants, a wide-brimmed hat, and sunglasses whenever you are outside.  Heart disease, diabetes, and high blood pressure  High blood pressure causes heart disease and increases the risk of stroke. High blood pressure is more likely to develop in: ? People who have blood pressure in the high end of the normal range (130-139/85-89 mm Hg). ? People who are overweight or obese. ? People who are African American.  If you are 15-42 years of age, have your blood pressure checked every 3-5 years. If you are 2 years of age or older, have your blood pressure checked every year. You should have your blood pressure measured twice-once when you are at a hospital or clinic, and once when you are not at a hospital or clinic. Record the average of the two measurements.  To check your blood pressure when you are not at a hospital or clinic, you can use: ? An automated blood pressure machine at a pharmacy. ? A home blood pressure monitor.  If you are between 56 years and 57 years old, ask your health care provider if you should take aspirin to prevent strokes.  Have regular diabetes screenings. This involves taking a blood sample to check your fasting blood sugar level. ? If you are at a normal weight and have a low risk for diabetes, have this test once every three years after 67 years of age. ? If you are overweight and have a high risk for diabetes, consider being tested at a younger age or more often. Preventing infection Hepatitis B  If you have a higher risk for hepatitis B, you should be screened for this virus. You are considered at high risk for hepatitis B if: ? You were born in a country where hepatitis B is common. Ask your health care provider which countries are considered high risk. ? Your parents were born in a high-risk country, and you have not been immunized against hepatitis B (hepatitis B vaccine). ? You have HIV or AIDS. ? You use needles to inject street drugs. ? You live with someone who has hepatitis B. ? You have had sex with someone who has hepatitis B. ? You get hemodialysis treatment. ? You take certain medicines for conditions, including cancer, organ transplantation, and autoimmune conditions.  Hepatitis C  Blood testing is recommended for: ? Everyone born from [birth year] through 1966. ? Anyone with known risk factors for hepatitis C.  Sexually transmitted infections (STIs)  You should be screened for sexually transmitted infections (STIs) including gonorrhea and chlamydia if: ? You are sexually active and are younger than 68 years of age. ? You are older than 68 years of age and your health care provider tells you that you are at risk for this type of infection. ? Your sexual activity has changed since you were last screened  and you are at an increased risk for chlamydia or gonorrhea. Ask your health care provider if you are at risk.  If you do not have HIV, but are at risk, it may be recommended that you take a prescription medicine daily to prevent HIV infection. This is called pre-exposure prophylaxis (PrEP). You are considered at risk if: ? You are sexually active and do not regularly use condoms or  know the HIV status of your partner(s). ? You take drugs by injection. ? You are sexually active with a partner who has HIV.  Talk with your health care provider about whether you are at high risk of being infected with HIV. If you choose to begin PrEP, you should first be tested for HIV. You should then be tested every 3 months for as long as you are taking PrEP. Pregnancy  If you are premenopausal and you may become pregnant, ask your health care provider about preconception counseling.  If you may become pregnant, take 400 to 800 micrograms (mcg) of folic acid every day.  If you want to prevent pregnancy, talk to your health care provider about birth control (contraception). Osteoporosis and menopause  Osteoporosis is a disease in which the bones lose minerals and strength with aging. This can result in serious bone fractures. Your risk for osteoporosis can be identified using a bone density scan.  If you are 51 years of age or older, or if you are at risk for osteoporosis and fractures, ask your health care provider if you should be screened.  Ask your health care provider whether you should take a calcium or vitamin D supplement to lower your risk for osteoporosis.  Menopause may have certain physical symptoms and risks.  Hormone replacement therapy may reduce some of these symptoms and risks. Talk to your health care provider about whether hormone replacement therapy is right for you. Follow these instructions at home:  Schedule regular health, dental, and eye exams.  Stay current with your  immunizations.  Do not use any tobacco products including cigarettes, chewing tobacco, or electronic cigarettes.  If you are pregnant, do not drink alcohol.  If you are breastfeeding, limit how much and how often you drink alcohol.  Limit alcohol intake to no more than 1 drink per day for nonpregnant women. One drink equals 12 ounces of beer, 5 ounces of wine, or 1 ounces of hard liquor.  Do not use street drugs.  Do not share needles.  Ask your health care provider for help if you need support or information about quitting drugs.  Tell your health care provider if you often feel depressed.  Tell your health care provider if you have ever been abused or do not feel safe at home. This information is not intended to replace advice given to you by your health care provider. Make sure you discuss any questions you have with your health care provider. Document Released: 07/06/2010 Document Revised: 05/29/2015 Document Reviewed: 09/24/2014 Elsevier Interactive Patient Education  [employer].

## 2016-11-02 NOTE — Progress Notes (Signed)
BP 122/72   Pulse 87   Temp 97.8 F (36.6 C) (Oral)   Resp 14   Ht 5' 2.63" (1.591 m)   Wt 198 lb 8 oz (90 kg)   SpO2 97%   BMI 35.59 kg/m    Subjective:    Patient ID: Pamela Dodson, female    DOB: [birth year], 66 y.o.   MRN: [id number]  HPI: Pamela Dodson is a 66 y.o. female  Chief Complaint  Patient presents with  . Medicare Wellness    HPI    Depression screen [facility] 2/9 11/02/2016 07/12/2016 01/06/2016 06/23/2015 01/22/2015  Decreased Interest 0 0 0 0 0  Down, Depressed, Hopeless 0 0 0 1 0  PHQ - 2 Score 0 0 0 1 0    Relevant past medical, surgical, family and social history reviewed Past Medical History:  Diagnosis Date  . Arthritis   . Blood transfusion   . Cancer (HCC)    hx thyroid cancer  . GERD (gastroesophageal reflux disease)   . Hypertension   . Hypothyroidism   . Neuromuscular disorder (HCC)    numbnes lower legs s/p knee replacements  . PONV (postoperative nausea and vomiting)    usually needs zofran/antiemetic prior to surgery  . Wears contact lenses    Past Surgical History:  Procedure Laterality Date  . APPENDECTOMY    . CARPAL TUNNEL RELEASE Left 2016  . CESAREAN SECTION    . CHOLECYSTECTOMY    . COLON SURGERY     diverticulitic mass removed  . COLONOSCOPY WITH PROPOFOL N/A 08/15/2014   Procedure: COLONOSCOPY WITH PROPOFOL;  Surgeon: Lucilla Lame, MD;  Location: [place];  Service: Endoscopy;  Laterality: N/A;  . GANGLION CYST EXCISION     L wrist  . JOINT REPLACEMENT    . knee arthroscopy    . THYROIDECTOMY  2009  . TOTAL HIP ARTHROPLASTY  12/16/2010   Procedure: TOTAL HIP ARTHROPLASTY;  Surgeon: Kerin Salen;  Location: [facility];  Service: Orthopedics;  Laterality: Left;  Depuy  . TOTAL KNEE ARTHROPLASTY  2009   Family History  Problem Relation Age of Onset  . Cancer Mother        lung  . Myelodysplastic syndrome Mother   . Hypothyroidism Mother   . Hypertension Brother   . Stroke Brother   . Birth defects Son       congenital genetic abnormality  . Dementia Maternal Grandfather   . Heart disease Paternal Grandmother   . Stroke Paternal Grandmother   . Heart attack Paternal Grandfather    Social History   Social History  . Marital status: Married    Spouse name: N/A  . Number of children: N/A  . Years of education: N/A   Occupational History  . Not on file.   Social History Main Topics  . Smoking status: Never Smoker  . Smokeless tobacco: Never Used  . Alcohol use 0.6 oz/week    1 Glasses of wine per week     Comment: occasional  . Drug use: No  . Sexual activity: Yes    Partners: Male   Other Topics Concern  . Not on file   Social History Narrative  . No narrative on file    Interim medical history since last visit reviewed. Allergies and medications reviewed  Review of Systems Per HPI unless specifically indicated above     Objective:    BP 122/72   Pulse 87   Temp 97.8 F (36.6 C) (  Oral)   Resp 14   Ht 5' 2.63" (1.591 m)   Wt 198 lb 8 oz (90 kg)   SpO2 97%   BMI 35.59 kg/m   Wt Readings from Last 3 Encounters:  11/02/16 198 lb 8 oz (90 kg)  07/12/16 195 lb 1.6 oz (88.5 kg)  01/06/16 193 lb 3 oz (87.6 kg)    Physical Exam  Results for orders placed or performed in visit on 07/12/16  POCT HgB A1C  Result Value Ref Range   Hemoglobin A1C 5.6       Assessment & Plan:   Problem List Items Addressed This Visit    None       Follow up plan: No Follow-up on file.  An after-visit summary was printed and given to the patient at [place].  Please see the patient instructions which may contain other information and recommendations beyond what is mentioned above in the assessment and plan.  Meds ordered this encounter  Medications  . Ascorbic Acid (VITAMIN C) 100 MG tablet    Sig: Take 100 mg by mouth daily.  . Multiple Vitamin (MULTIVITAMIN) tablet    Sig: Take 1 tablet by mouth daily.    No orders of the defined types were placed in this  encounter.

## 2016-11-02 NOTE — Assessment & Plan Note (Signed)
Full code status; paperwork given for official HCPOA and living will

## 2016-12-31 ENCOUNTER — Other Ambulatory Visit: Payer: Self-pay | Admitting: Family Medicine

## 2016-12-31 NOTE — Telephone Encounter (Signed)
She'll be due for labs soon 90 day supply approved

## 2017-01-13 ENCOUNTER — Ambulatory Visit (INDEPENDENT_AMBULATORY_CARE_PROVIDER_SITE_OTHER): Payer: Medicare Other | Admitting: Family Medicine

## 2017-01-13 ENCOUNTER — Encounter: Payer: Self-pay | Admitting: Family Medicine

## 2017-01-13 DIAGNOSIS — R7303 Prediabetes: Secondary | ICD-10-CM | POA: Diagnosis not present

## 2017-01-13 DIAGNOSIS — I10 Essential (primary) hypertension: Secondary | ICD-10-CM | POA: Diagnosis not present

## 2017-01-13 DIAGNOSIS — E7800 Pure hypercholesterolemia, unspecified: Secondary | ICD-10-CM | POA: Diagnosis not present

## 2017-01-13 DIAGNOSIS — Z5181 Encounter for therapeutic drug level monitoring: Secondary | ICD-10-CM | POA: Diagnosis not present

## 2017-01-13 DIAGNOSIS — E890 Postprocedural hypothyroidism: Secondary | ICD-10-CM

## 2017-01-13 DIAGNOSIS — R232 Flushing: Secondary | ICD-10-CM | POA: Diagnosis not present

## 2017-01-13 MED ORDER — LEVOTHYROXINE SODIUM 150 MCG PO TABS: 150.0000 ug | ORAL_TABLET | Freq: Every day | ORAL | 3 refills | Status: DC

## 2017-01-13 MED ORDER — VENLAFAXINE HCL ER 37.5 MG PO CP24: 37.5000 mg | ORAL_CAPSULE | Freq: Every day | ORAL | 3 refills | Status: DC

## 2017-01-13 MED ORDER — AMLODIPINE BESY-BENAZEPRIL HCL 10-20 MG PO CAPS: 1.0000 | ORAL_CAPSULE | Freq: Every day | ORAL | 3 refills | Status: DC

## 2017-01-13 NOTE — Assessment & Plan Note (Signed)
Check TSH 

## 2017-01-13 NOTE — Assessment & Plan Note (Signed)
Check lipids today; continue med, limit eggs and cheese

## 2017-01-13 NOTE — Patient Instructions (Addendum)
Try a sublingual vitamin B12 in the early afternoon Try to follow the DASH guidelines (DASH stands for Dietary Approaches to Stop Hypertension). Try to limit the sodium in your diet to no more than 1,500mg  of sodium per day. Certainly try to not exceed 2,000 mg per day at the very most. Do not add salt when cooking or at the table.  Check the sodium amount on labels when shopping, and choose items lower in sodium when given a choice. Avoid or limit foods that already contain a lot of sodium. Eat a diet rich in fruits and vegetables and whole grains, and try to lose weight if overweight or obese

## 2017-01-13 NOTE — Progress Notes (Signed)
BP 132/82   Pulse 83   Temp 98.5 F (36.9 C) (Oral)   Resp 14   Wt 199 lb 8 oz (90.5 kg)   SpO2 95%   BMI 35.76 kg/m    Subjective:    Patient ID: Pamela Dodson, female    DOB: [birth year], 67 y.o.   MRN: [id number]  HPI: Pamela Dodson is a 67 y.o. female  Chief Complaint  Patient presents with  . Follow-up  . Medication Refill    HPI Patient is here for f/u  High cholesterol; some weeks no eggs; may go 2-3 weeks without eggs; not a big cheese eater Lab Results  Component Value Date   CHOL 180 01/06/2016   HDL 50 (L) 01/06/2016   LDLCALC 100 (H) 01/06/2016   TRIG 151 (H) 01/06/2016   CHOLHDL 3.6 01/06/2016   High blood pressure; not checking BP away from the doctor; no chest pain or headaches; not really adding salt to her food  Hypothyroidism; energy level has been "okay"; not sure if age-related, thinks it is fine; gives out late afternoon, just notices a little; bowels are okay for the most part; weight overall stable Lab Results  Component Value Date   TSH 2.37 01/06/2016   Prediabetes; no strong fam hx; no dry mouth or blurred vision  She has hot flashes, but those are greatly improved with the effexor  She would like another set of HCPOA and living will  Depression screen [facility] 2/9 01/13/2017 11/02/2016 07/12/2016 01/06/2016 06/23/2015  Decreased Interest 0 0 0 0 0  Down, Depressed, Hopeless 0 0 0 0 1  PHQ - 2 Score 0 0 0 0 1    Relevant past medical, surgical, family and social history reviewed Past Medical History:  Diagnosis Date  . Arthritis   . Blood transfusion   . Cancer (HCC)    hx thyroid cancer  . Full code status 11/02/2016  . GERD (gastroesophageal reflux disease)   . Hypertension   . Hypothyroidism   . Neuromuscular disorder (HCC)    numbnes lower legs s/p knee replacements  . PONV (postoperative nausea and vomiting)    usually needs zofran/antiemetic prior to surgery  . Wears contact lenses    Past Surgical History:  Procedure  Laterality Date  . APPENDECTOMY    . CARPAL TUNNEL RELEASE Left 2016  . CESAREAN SECTION    . CHOLECYSTECTOMY    . COLON SURGERY     diverticulitic mass removed  . COLONOSCOPY WITH PROPOFOL N/A 08/15/2014   Procedure: COLONOSCOPY WITH PROPOFOL;  Surgeon: Lucilla Lame, MD;  Location: [place];  Service: Endoscopy;  Laterality: N/A;  . GANGLION CYST EXCISION     L wrist  . JOINT REPLACEMENT    . knee arthroscopy    . THYROIDECTOMY  2009  . TOTAL HIP ARTHROPLASTY  12/16/2010   Procedure: TOTAL HIP ARTHROPLASTY;  Surgeon: Kerin Salen;  Location: [place];  Service: Orthopedics;  Laterality: Left;  Depuy  . TOTAL KNEE ARTHROPLASTY  2009   Family History  Problem Relation Age of Onset  . Cancer Mother        lung  . Myelodysplastic syndrome Mother   . Hypothyroidism Mother   . Hypertension Brother   . Stroke Brother   . Atrial fibrillation Brother   . Birth defects Son        congenital genetic abnormality  . Dementia Maternal Grandfather   . Heart disease Paternal Grandmother  heart disease in her 21's  . Stroke Paternal Grandmother   . Heart attack Paternal Grandfather   . Heart disease Paternal Aunt   . Heart attack Paternal Aunt   . Stroke Paternal Aunt   . Atrial fibrillation Paternal Uncle    Social History   Tobacco Use  . Smoking status: Never Smoker  . Smokeless tobacco: Never Used  Substance Use Topics  . Alcohol use: Yes    Alcohol/week: 0.6 oz    Types: 1 Glasses of wine per week    Comment: occasional  . Drug use: No   Interim medical history since last visit reviewed. Allergies and medications reviewed  Review of Systems Per HPI unless specifically indicated above     Objective:    BP 132/82   Pulse 83   Temp 98.5 F (36.9 C) (Oral)   Resp 14   Wt 199 lb 8 oz (90.5 kg)   SpO2 95%   BMI 35.76 kg/m   Wt Readings from Last 3 Encounters:  01/13/17 199 lb 8 oz (90.5 kg)  11/02/16 198 lb 8 oz (90 kg)  07/12/16 195 lb 1.6 oz (88.5  kg)    Physical Exam  Constitutional: She appears well-developed and well-nourished. No distress.  obese  HENT:  Head: Normocephalic and atraumatic.  Eyes: EOM are normal. No scleral icterus.  Neck: No thyromegaly present.  Cardiovascular: Normal rate and regular rhythm.  Pulmonary/Chest: Effort normal and breath sounds normal. She has no wheezes.  Abdominal: Soft. Bowel sounds are normal. She exhibits no distension.  Musculoskeletal: She exhibits no edema.  Neurological: She is alert. She exhibits normal muscle tone.  Skin: Skin is warm and dry. No pallor.  Psychiatric: She has a normal mood and affect. Her behavior is normal.      Assessment & Plan:   Problem List Items Addressed This Visit      Cardiovascular and Mediastinum   Hypertension goal BP (blood pressure) < 150/90    Controlled, continue medicines and try DASH guidelines      Relevant Medications   amLODipine-benazepril (LOTREL) 10-20 MG capsule     Endocrine   Hypothyroidism, postop    Check TSH      Relevant Medications   levothyroxine (SYNTHROID, LEVOTHROID) 150 MCG tablet   Other Relevant Orders   TSH     Other   Prediabetes    Check A1c; not fasting, oatmeal around 11:30 am      Relevant Orders   Hemoglobin A1c   Medication monitoring encounter    Check K+ and Cr      Relevant Orders   COMPLETE METABOLIC PANEL WITH GFR   Hypercholesterolemia without hypertriglyceridemia    Check lipids today; continue med, limit eggs and cheese      Relevant Medications   amLODipine-benazepril (LOTREL) 10-20 MG capsule   Other Relevant Orders   Lipid panel    Other Visit Diagnoses    Hot flashes       Relevant Medications   amLODipine-benazepril (LOTREL) 10-20 MG capsule   venlafaxine XR (EFFEXOR-XR) 37.5 MG 24 hr capsule       Follow up plan: Return in about 6 months (around 07/13/2017) for follow-up visit with Dr. Sanda Klein.  An after-visit summary was printed and given to the patient at [place].   Please see the patient instructions which may contain other information and recommendations beyond what is mentioned above in the assessment and plan.  Meds ordered this encounter  Medications  . amLODipine-benazepril (LOTREL)  10-20 MG capsule    Sig: Take 1 capsule by mouth daily. For blood pressure    Dispense:  90 capsule    Refill:  3  . venlafaxine XR (EFFEXOR-XR) 37.5 MG 24 hr capsule    Sig: Take 1 capsule (37.5 mg total) by mouth daily with breakfast.    Dispense:  90 capsule    Refill:  3  . levothyroxine (SYNTHROID, LEVOTHROID) 150 MCG tablet    Sig: Take 1 tablet (150 mcg total) by mouth daily.    Dispense:  90 tablet    Refill:  3    Orders Placed This Encounter  Procedures  . COMPLETE METABOLIC PANEL WITH GFR  . Hemoglobin A1c  . Lipid panel  . TSH

## 2017-01-13 NOTE — Assessment & Plan Note (Signed)
Check A1c; not fasting, oatmeal around 11:30 am

## 2017-01-13 NOTE — Assessment & Plan Note (Signed)
Controlled, continue medicines and try DASH guidelines

## 2017-01-13 NOTE — Assessment & Plan Note (Signed)
Check K+ and Cr

## 2017-01-14 ENCOUNTER — Encounter: Payer: Self-pay | Admitting: Family Medicine

## 2017-01-14 LAB — COMPLETE METABOLIC PANEL WITH GFR
AG RATIO: 1.6 (calc) (ref 1.0–2.5)
ALKALINE PHOSPHATASE (APISO): 66 U/L (ref 33–130)
ALT: 17 U/L (ref 6–29)
AST: 17 U/L (ref 10–35)
Albumin: 4.4 g/dL (ref 3.6–5.1)
BILIRUBIN TOTAL: 0.6 mg/dL (ref 0.2–1.2)
BUN: 18 mg/dL (ref 7–25)
CHLORIDE: 102 mmol/L (ref 98–110)
CO2: 31 mmol/L (ref 20–32)
Calcium: 9.1 mg/dL (ref 8.6–10.4)
Creat: 0.6 mg/dL (ref 0.50–0.99)
GFR, Est African American: 109 mL/min/{1.73_m2} (ref >=60)
GFR, Est Non African American: 94 mL/min/{1.73_m2} (ref >=60)
GLOBULIN: 2.8 g/dL (ref 1.9–3.7)
Glucose, Bld: 95 mg/dL (ref 65–99)
POTASSIUM: 4.3 mmol/L (ref 3.5–5.3)
SODIUM: 141 mmol/L (ref 135–146)
Total Protein: 7.2 g/dL (ref 6.1–8.1)

## 2017-01-14 LAB — TSH: TSH: 6.45 mIU/L — ABNORMAL HIGH (ref 0.40–4.50)

## 2017-01-14 LAB — LIPID PANEL
CHOLESTEROL: 181 mg/dL (ref <200)
HDL: 48 mg/dL — AB (ref >50)
LDL Cholesterol (Calc): 103 mg/dL (calc) — ABNORMAL HIGH
Non-HDL Cholesterol (Calc): 133 mg/dL (calc) — ABNORMAL HIGH (ref <130)
TRIGLYCERIDES: 188 mg/dL — AB (ref <150)
Total CHOL/HDL Ratio: 3.8 (calc) (ref <5.0)

## 2017-01-14 LAB — HEMOGLOBIN A1C
HEMOGLOBIN A1C: 5.6 %{Hb} (ref <5.7)
MEAN PLASMA GLUCOSE: 114 (calc)
eAG (mmol/L): 6.3 (calc)

## 2017-01-17 ENCOUNTER — Other Ambulatory Visit: Payer: Self-pay | Admitting: Family Medicine

## 2017-01-17 DIAGNOSIS — E890 Postprocedural hypothyroidism: Secondary | ICD-10-CM

## 2017-01-17 MED ORDER — LEVOTHYROXINE SODIUM 25 MCG PO TABS: ORAL_TABLET | ORAL | 2 refills | Status: DC

## 2017-01-17 NOTE — Progress Notes (Signed)
Check TSH around march 12

## 2017-01-17 NOTE — Telephone Encounter (Signed)
Add a little extra thyroid medicine Recheck TSH in 8 weeks

## 2017-01-18 ENCOUNTER — Telehealth: Payer: Self-pay | Admitting: Family Medicine

## 2017-01-18 MED ORDER — LEVOTHYROXINE SODIUM 25 MCG PO TABS: ORAL_TABLET | ORAL | 0 refills | Status: DC

## 2017-01-18 NOTE — Telephone Encounter (Signed)
3 month supply sent to [facility]

## 2017-01-18 NOTE — Telephone Encounter (Signed)
Please resolve with pharmacy There should be one prescription for 150 mcg There should be another prescription for 25 mcg These are two separate prescriptions and she needs them both Let them know her brand preference Thank you

## 2017-01-18 NOTE — Addendum Note (Signed)
Addended by: Tyronn Golda, Satira Anis on: 01/18/2017 09:51 AM   Modules accepted: Orders

## 2017-01-18 NOTE — Telephone Encounter (Signed)
Copied from [place] [phone]. Topic: Quick Communication - Rx Refill/Question >> Jan 18, 2017  3:15 PM Oliver Pila B wrote: Medication: levothyroxine (SYNTHROID) 25 MCG tablet [[id number]]  Hinton Dyer from [facility] called b/c it was concerns regarding the medication: 1.The directions need to clarified b/c its an identical Rx; duplicate Rx of [id number] 1 daily  2. Mylene brand is referred per pt history to give to pt   Reference # [id number]

## 2017-01-19 NOTE — Telephone Encounter (Signed)
Pharmacy notified.

## 2017-01-26 NOTE — Telephone Encounter (Signed)
Pt called in to make provider aware that pharmacy never received medication (synthroid)    Please assist further.

## 2017-02-19 NOTE — Progress Notes (Signed)
Closing out scanned document note open since  10/18

## 2017-02-19 NOTE — Telephone Encounter (Signed)
Going through unsigned charts Closing this out  Routing History   Priority Sent On From To Message Type   12/15/2015 11:52 AM Imre Vecchione, Satira Anis, MD Cathrine Muster, [place]    12/15/2015 10:06 AM Interface, Surescripts Out P Shirla Hodgkiss RX REFILL   Created by   [employer], Surescripts Out on 12/15/2015 10:06 AM

## 2017-02-19 NOTE — Progress Notes (Signed)
Closing out lab/order note open since:  2017

## 2017-03-03 DIAGNOSIS — H18831 Recurrent erosion of cornea, right eye: Secondary | ICD-10-CM | POA: Diagnosis not present

## 2017-03-08 DIAGNOSIS — H18831 Recurrent erosion of cornea, right eye: Secondary | ICD-10-CM | POA: Diagnosis not present

## 2017-03-10 ENCOUNTER — Other Ambulatory Visit: Payer: Self-pay

## 2017-03-10 DIAGNOSIS — E890 Postprocedural hypothyroidism: Secondary | ICD-10-CM | POA: Diagnosis not present

## 2017-03-11 ENCOUNTER — Other Ambulatory Visit: Payer: Self-pay | Admitting: Family Medicine

## 2017-03-11 DIAGNOSIS — E890 Postprocedural hypothyroidism: Secondary | ICD-10-CM

## 2017-03-11 LAB — TSH: TSH: 7.96 m[IU]/L — AB (ref 0.40–4.50)

## 2017-03-11 MED ORDER — LEVOTHYROXINE SODIUM 175 MCG PO TABS: 175.0000 ug | ORAL_TABLET | Freq: Every day | ORAL | 0 refills | Status: DC

## 2017-03-11 NOTE — Progress Notes (Signed)
Increase thyroid med Recheck TSH in 6-8 weeks

## 2017-03-12 ENCOUNTER — Encounter: Payer: Self-pay | Admitting: Family Medicine

## 2017-03-16 DIAGNOSIS — H18831 Recurrent erosion of cornea, right eye: Secondary | ICD-10-CM | POA: Diagnosis not present

## 2017-04-04 ENCOUNTER — Other Ambulatory Visit: Payer: Self-pay | Admitting: Family Medicine

## 2017-04-04 NOTE — Telephone Encounter (Signed)
Mail order requested 90 day supply of 175 mcg synthroid Denied I just sent 90 day supply of same med to same pharmacy on 03/11/17

## 2017-04-29 ENCOUNTER — Other Ambulatory Visit: Payer: Self-pay | Admitting: Family Medicine

## 2017-04-29 NOTE — Telephone Encounter (Signed)
Please remind patient about having TSH drawn 6-8 weeks after the new dose, and she should not be out of thyroid med yet. Will send refills when TSH is back. Thank you

## 2017-04-29 NOTE — Telephone Encounter (Signed)
Left detailed voicemail

## 2017-05-03 ENCOUNTER — Other Ambulatory Visit: Payer: Self-pay

## 2017-05-03 DIAGNOSIS — E890 Postprocedural hypothyroidism: Secondary | ICD-10-CM | POA: Diagnosis not present

## 2017-05-03 LAB — TSH: TSH: 2.5 m[IU]/L (ref 0.40–4.50)

## 2017-05-09 ENCOUNTER — Other Ambulatory Visit: Payer: Self-pay | Admitting: Family Medicine

## 2017-05-09 MED ORDER — LEVOTHYROXINE SODIUM 175 MCG PO TABS: 175.0000 ug | ORAL_TABLET | Freq: Every day | ORAL | 3 refills | Status: DC

## 2017-05-09 NOTE — Progress Notes (Unsigned)
Normal TSH; refills for one year sent

## 2017-06-29 DIAGNOSIS — M65352 Trigger finger, left little finger: Secondary | ICD-10-CM | POA: Diagnosis not present

## 2017-06-29 DIAGNOSIS — M65342 Trigger finger, left ring finger: Secondary | ICD-10-CM | POA: Diagnosis not present

## 2017-06-29 DIAGNOSIS — M19042 Primary osteoarthritis, left hand: Secondary | ICD-10-CM | POA: Diagnosis not present

## 2017-07-14 ENCOUNTER — Ambulatory Visit (INDEPENDENT_AMBULATORY_CARE_PROVIDER_SITE_OTHER): Payer: Medicare Other | Admitting: Family Medicine

## 2017-07-14 ENCOUNTER — Encounter: Payer: Self-pay | Admitting: Family Medicine

## 2017-07-14 VITALS — BP 136/80 | HR 94 | Temp 98.4°F | Resp 12 | Ht 63.0 in | Wt 199.7 lb

## 2017-07-14 DIAGNOSIS — E7800 Pure hypercholesterolemia, unspecified: Secondary | ICD-10-CM | POA: Diagnosis not present

## 2017-07-14 DIAGNOSIS — Z5181 Encounter for therapeutic drug level monitoring: Secondary | ICD-10-CM

## 2017-07-14 DIAGNOSIS — R7303 Prediabetes: Secondary | ICD-10-CM | POA: Diagnosis not present

## 2017-07-14 DIAGNOSIS — I10 Essential (primary) hypertension: Secondary | ICD-10-CM | POA: Diagnosis not present

## 2017-07-14 DIAGNOSIS — E2839 Other primary ovarian failure: Secondary | ICD-10-CM

## 2017-07-14 DIAGNOSIS — R351 Nocturia: Secondary | ICD-10-CM | POA: Insufficient documentation

## 2017-07-14 NOTE — Assessment & Plan Note (Signed)
DASH guidelines, weight loss

## 2017-07-14 NOTE — Assessment & Plan Note (Signed)
Patient declines sleep study; recommended checking glucose and A1c to see if diabetes; other option is seeing urologist

## 2017-07-14 NOTE — Assessment & Plan Note (Signed)
Check A1c. 

## 2017-07-14 NOTE — Progress Notes (Signed)
BP 136/80   Pulse 94   Temp 98.4 F (36.9 C) (Oral)   Resp 12   Ht 5\' 3"  (1.6 m)   Wt 199 lb 11.2 oz (90.6 kg)   SpO2 94%   BMI 35.38 kg/m    Subjective:    Patient ID: Pamela Dodson, female    DOB: Feb 11, 1950, 67 y.o.   MRN: [id number]  HPI: Pamela Dodson is a 67 y.o. female  Chief Complaint  Patient presents with  . Follow-up    HPI Patient is here for f/u She has high cholesterol; staying away from fatty meats; not much cheese, just occasional; loves milk, drinks milk occasional, lowfat, skim Lab Results  Component Value Date   CHOL 181 01/13/2017   HDL 48 (L) 01/13/2017   LDLCALC 103 (H) 01/13/2017   TRIG 188 (H) 01/13/2017   CHOLHDL 3.8 01/13/2017   Hypertension; not checking at home, okay here; she does not put much salt in the food; not avoiding it entirely; always tastes food first; no decongestants BP Readings from Last 3 Encounters:  07/14/17 136/80  01/13/17 132/82  11/02/16 122/72   Hypothyroidism; bowel movements and energy same Lab Results  Component Value Date   TSH 2.50 05/03/2017   Sleep is interrupted by needing to urinate every 2 hours at night; she can do fine during the day; she is not getting really good sleep; she sleeps in longer; by 4 am, she can go 2.5 to 3 hours; she has tried cutting back fluids; she thought it might be her joints; back and joints are waking her up maybe; no sleep apnea; no fam hx of sleep apnea, no leg edema; no SHOB or orthopnea; she goes about 2.5 hours, up 3 x a night; always afraid to take sleep medicine; sleeps fine and goes back to sleep after urinating; she is drinking average amounts of fluid in the evening; maybe more when hot; tried restricting fluids; no burning with urination, no odor or blood in the urine; takes a small cup of water for night-time water; takes thyroid medicine early in the morning; "I have a tiny bladder" she says; I have always known that; going on for several years she says; started with  waking up 1x a night, then 2x a night, now 3x a night; no chocolate, but does drink caffeine, some nights; orange soda with less caffeine at night; green tea in the AM  Obesity; she does not think she needs any help; just shlepping along, just enjoying food  Depression screen [facility] 2/9 07/14/2017 01/13/2017 11/02/2016 07/12/2016 01/06/2016  Decreased Interest 0 0 0 0 0  Down, Depressed, Hopeless 1 0 0 0 0  PHQ - 2 Score 1 0 0 0 0  Altered sleeping 3 - - - -  Tired, decreased energy 0 - - - -  Change in appetite 0 - - - -  Feeling bad or failure about yourself  0 - - - -  Trouble concentrating 0 - - - -  Moving slowly or fidgety/restless 0 - - - -  Suicidal thoughts 0 - - - -  PHQ-9 Score 4 - - - -  Difficult doing work/chores Not difficult at all - - - -    Relevant past medical, surgical, family and social history reviewed Past Medical History:  Diagnosis Date  . Arthritis   . Blood transfusion   . Cancer (HCC)    hx thyroid cancer  . Full code status 11/02/2016  .  GERD (gastroesophageal reflux disease)   . Hypertension   . Hypothyroidism   . Neuromuscular disorder (HCC)    numbnes lower legs s/p knee replacements  . PONV (postoperative nausea and vomiting)    usually needs zofran/antiemetic prior to surgery  . Wears contact lenses    Past Surgical History:  Procedure Laterality Date  . APPENDECTOMY    . CARPAL TUNNEL RELEASE Left 2016  . CESAREAN SECTION    . CHOLECYSTECTOMY    . COLON SURGERY     diverticulitic mass removed  . COLONOSCOPY WITH PROPOFOL N/A 08/15/2014   Procedure: COLONOSCOPY WITH PROPOFOL;  Surgeon: Lucilla Lame, MD;  Location: [place];  Service: Endoscopy;  Laterality: N/A;  . GANGLION CYST EXCISION     L wrist  . JOINT REPLACEMENT    . knee arthroscopy    . THYROIDECTOMY  2009  . TOTAL HIP ARTHROPLASTY  12/16/2010   Procedure: TOTAL HIP ARTHROPLASTY;  Surgeon: Kerin Salen;  Location: [place];  Service: Orthopedics;  Laterality: Left;  Depuy    . TOTAL KNEE ARTHROPLASTY  2009   Family History  Problem Relation Age of Onset  . Cancer Mother        lung  . Myelodysplastic syndrome Mother   . Hypothyroidism Mother   . Hypertension Brother   . Stroke Brother   . Atrial fibrillation Brother   . Birth defects Son        congenital genetic abnormality  . Dementia Maternal Grandfather   . Heart disease Paternal Grandmother        heart disease in her 43's  . Stroke Paternal Grandmother   . Heart attack Paternal Grandfather   . Heart disease Paternal Aunt   . Heart attack Paternal Aunt   . Stroke Paternal Aunt   . Atrial fibrillation Paternal Uncle    Social History   Tobacco Use  . Smoking status: Never Smoker  . Smokeless tobacco: Never Used  Substance Use Topics  . Alcohol use: Yes    Alcohol/week: 0.6 oz    Types: 1 Glasses of wine per week    Comment: occasional  . Drug use: No    Interim medical history since last visit reviewed. Allergies and medications reviewed  Review of Systems Per HPI unless specifically indicated above     Objective:    BP 136/80   Pulse 94   Temp 98.4 F (36.9 C) (Oral)   Resp 12   Ht 5\' 3"  (1.6 m)   Wt 199 lb 11.2 oz (90.6 kg)   SpO2 94%   BMI 35.38 kg/m   Wt Readings from Last 3 Encounters:  07/14/17 199 lb 11.2 oz (90.6 kg)  01/13/17 199 lb 8 oz (90.5 kg)  11/02/16 198 lb 8 oz (90 kg)    Physical Exam  Constitutional: She appears well-developed and well-nourished. No distress.  obese  HENT:  Head: Normocephalic and atraumatic.  Eyes: EOM are normal. No scleral icterus.  Neck: No thyromegaly present.  Cardiovascular: Normal rate, regular rhythm and normal heart sounds.  No murmur heard. Pulmonary/Chest: Effort normal and breath sounds normal. No respiratory distress. She has no wheezes.  Abdominal: Soft. Bowel sounds are normal. She exhibits no distension.  Musculoskeletal: Normal range of motion. She exhibits no edema.  Neurological: She is alert. She  exhibits normal muscle tone.  Skin: Skin is warm and dry. She is not diaphoretic. No pallor.  Psychiatric: She has a normal mood and affect. Her behavior is normal.  Judgment and thought content normal.    Results for orders placed or performed in visit on 05/03/17  TSH  Result Value Ref Range   TSH 2.50 0.40 - 4.50 mIU/L      Assessment & Plan:   Problem List Items Addressed This Visit      Cardiovascular and Mediastinum   Hypertension goal BP (blood pressure) < 150/90 - Primary    DASH guidelines, weight loss        Other   Medication monitoring encounter   Relevant Orders   COMPLETE METABOLIC PANEL WITH GFR   Prediabetes    Check A1c      Relevant Orders   Microalbumin / creatinine urine ratio   Lipid panel   Hemoglobin A1c   Nocturia    Patient declines sleep study; recommended checking glucose and A1c to see if diabetes; other option is seeing urologist      Relevant Orders   Urinalysis w microscopic + reflex cultur   Morbid obesity ([place])    BMI > 35 plus HTN, high cholesterol      Hypercholesterolemia without hypertriglyceridemia    Check lipids, encouraged low saturated fat diet      Relevant Orders   Lipid panel    Other Visit Diagnoses    Estrogen deficiency       Relevant Orders   DG Bone Density       Follow up plan: No follow-ups on file.  An after-visit summary was printed and given to the patient at [place].  Please see the patient instructions which may contain other information and recommendations beyond what is mentioned above in the assessment and plan.  No orders of the defined types were placed in this encounter.   Orders Placed This Encounter  Procedures  . DG Bone Density  . Urinalysis w microscopic + reflex cultur  . Microalbumin / creatinine urine ratio  . Lipid panel  . Hemoglobin A1c  . COMPLETE METABOLIC PANEL WITH GFR

## 2017-07-14 NOTE — Assessment & Plan Note (Signed)
Check lipids, encouraged low saturated fat diet

## 2017-07-14 NOTE — Assessment & Plan Note (Signed)
BMI > 35 plus HTN, high cholesterol

## 2017-07-14 NOTE — Patient Instructions (Addendum)
Check out the information at familydoctor.org entitled "Nutrition for Weight Loss: What You Need to Know about Fad Diets" Try to lose between 1-2 pounds per week by taking in fewer calories and burning off more calories You can succeed by limiting portions, limiting foods dense in calories and fat, becoming more active, and drinking 8 glasses of water a day (64 ounces) Don't skip meals, especially breakfast, as skipping meals may alter your metabolism Do not use over-the-counter weight loss pills or gimmicks that claim rapid weight loss A healthy BMI (or body mass index) is between 18.5 and 24.9 You can calculate your ideal BMI at the [employer] website ClubMonetize.fr  Avoid caffeine, tea, coffee, other drinks as well as chocolate after noon  Try to use PLAIN allergy medicine without the decongestant Avoid: phenylephrine, phenylpropanolamine, and pseudoephredine  Please schedule your bone density at [place]   Obesity, Adult Obesity is the condition of having too much total body fat. Being overweight or obese means that your weight is greater than what is considered healthy for your body size. Obesity is determined by a measurement called BMI. BMI is an estimate of body fat and is calculated from height and weight. For adults, a BMI of 30 or higher is considered obese. Obesity can eventually lead to other health concerns and major illnesses, including:  Stroke.  Coronary artery disease (CAD).  Type 2 diabetes.  Some types of cancer, including cancers of the colon, breast, uterus, and gallbladder.  Osteoarthritis.  High blood pressure (hypertension).  High cholesterol.  Sleep apnea.  Gallbladder stones.  Infertility problems.  What are the causes? The main cause of obesity is taking in (consuming) more calories than your body uses for energy. Other factors that contribute to this condition may include:  Being born with  genes that make you more likely to become obese.  Having a medical condition that causes obesity. These conditions include: ? Hypothyroidism. ? Polycystic ovarian syndrome (PCOS). ? Binge-eating disorder. ? Cushing syndrome.  Taking certain medicines, such as steroids, antidepressants, and seizure medicines.  Not being physically active (sedentary lifestyle).  Living where there are limited places to exercise safely or buy healthy foods.  Not getting enough sleep.  What increases the risk? The following factors may increase your risk of this condition:  Having a family history of obesity.  Being a woman of African-American descent.  Being a man of Hispanic descent.  What are the signs or symptoms? Having excessive body fat is the main symptom of this condition. How is this diagnosed? This condition may be diagnosed based on:  Your symptoms.  Your medical history.  A physical exam. Your health care provider may measure: ? Your BMI. If you are an adult with a BMI between 25 and less than 30, you are considered overweight. If you are an adult with a BMI of 30 or higher, you are considered obese. ? The distances around your hips and your waist (circumferences). These may be compared to each other to help diagnose your condition. ? Your skinfold thickness. Your health care provider may gently pinch a fold of your skin and measure it.  How is this treated? Treatment for this condition often includes changing your lifestyle. Treatment may include some or all of the following:  Dietary changes. Work with your health care provider and a dietitian to set a weight-loss goal that is healthy and reasonable for you. Dietary changes may include eating: ? Smaller portions. A portion size is the amount of  a particular food that is healthy for you to eat at one time. This varies from person to person. ? Low-calorie or low-fat options. ? More whole grains, fruits, and vegetables.  Regular  physical activity. This may include aerobic activity (cardio) and strength training.  Medicine to help you lose weight. Your health care provider may prescribe medicine if you are unable to lose 1 pound a week after 6 weeks of eating more healthily and doing more physical activity.  Surgery. Surgical options may include gastric banding and gastric bypass. Surgery may be done if: ? Other treatments have not helped to improve your condition. ? You have a BMI of 40 or higher. ? You have life-threatening health problems related to obesity.  Follow these instructions at home:  Eating and drinking   Follow recommendations from your health care provider about what you eat and drink. Your health care provider may advise you to: ? Limit fast foods, sweets, and processed snack foods. ? Choose low-fat options, such as low-fat milk instead of whole milk. ? Eat 5 or more servings of fruits or vegetables every day. ? Eat at home more often. This gives you more control over what you eat. ? Choose healthy foods when you eat out. ? Learn what a healthy portion size is. ? Keep low-fat snacks on hand. ? Avoid sugary drinks, such as soda, fruit juice, iced tea sweetened with sugar, and flavored milk. ? Eat a healthy breakfast.  Drink enough water to keep your urine clear or pale yellow.  Do not go without eating for long periods of time (do not fast) or follow a fad diet. Fasting and fad diets can be unhealthy and even dangerous. Physical Activity  Exercise regularly, as told by your health care provider. Ask your health care provider what types of exercise are safe for you and how often you should exercise.  Warm up and stretch before being active.  Cool down and stretch after being active.  Rest between periods of activity. Lifestyle  Limit the time that you spend in front of your TV, computer, or video game system.  Find ways to reward yourself that do not involve food.  Limit alcohol  intake to no more than 1 drink a day for nonpregnant women and 2 drinks a day for men. One drink equals 12 oz of beer, 5 oz of wine, or 1 oz of hard liquor. General instructions  Keep a weight loss journal to keep track of the food you eat and how much you exercise you get.  Take over-the-counter and prescription medicines only as told by your health care provider.  Take vitamins and supplements only as told by your health care provider.  Consider joining a support group. Your health care provider may be able to recommend a support group.  Keep all follow-up visits as told by your health care provider. This is important. Contact a health care provider if:  You are unable to meet your weight loss goal after 6 weeks of dietary and lifestyle changes. This information is not intended to replace advice given to you by your health care provider. Make sure you discuss any questions you have with your health care provider. Document Released: 01/29/2004 Document Revised: 05/26/2015 Document Reviewed: 10/09/2014 Elsevier Interactive Patient Education  2018 [place].  Preventing Unhealthy [employer], Adult Staying at a healthy weight is important. When fat builds up in your body, you may become overweight or obese. These conditions put you at greater risk for  developing certain health problems, such as heart disease, diabetes, sleeping problems, joint problems, and some cancers. Unhealthy weight gain is often the result of making unhealthy choices in what you eat. It is also a result of not getting enough exercise. You can make changes to your lifestyle to prevent obesity and stay as healthy as possible. What nutrition changes can be made? To maintain a healthy weight and prevent obesity:  Eat only as much as your body needs. To do this: ? Pay attention to signs that you are hungry or full. Stop eating as soon as you feel full. ? If you feel hungry, try drinking water first. Drink enough water  so your urine is clear or pale yellow. ? Eat smaller portions. ? Look at serving sizes on food labels. Most foods contain more than one serving per container. ? Eat the recommended amount of calories for your gender and activity level. While most active people should eat around 2,000 calories per day, if you are trying to lose weight or are not very active, you main need to eat less calories. Talk to your health care provider or dietitian about how many calories you should eat each day.  Choose healthy foods, such as: ? Fruits and vegetables. Try to fill at least half of your plate at each meal with fruits and vegetables. ? Whole grains, such as whole wheat bread, brown rice, and quinoa. ? Lean meats, such as chicken or fish. ? Other healthy proteins, such as beans, eggs, or tofu. ? Healthy fats, such as nuts, seeds, fatty fish, and olive oil. ? Low-fat or fat-free dairy.  Check food labels and avoid food and drinks that: ? Are high in calories. ? Have added sugar. ? Are high in sodium. ? Have saturated fats or trans fats.  Limit how much you eat of the following foods: ? Prepackaged meals. ? Fast food. ? Fried foods. ? Processed meat, such as bacon, sausage, and deli meats. ? Fatty cuts of red meat and poultry with skin.  Cook foods in healthier ways, such as by baking, broiling, or grilling.  When grocery shopping, try to shop around the outside of the store. This helps you buy mostly fresh foods and avoid canned and prepackaged foods.  What lifestyle changes can be made?  Exercise at least 30 minutes 5 or more days each week. Exercising includes brisk walking, yard work, biking, running, swimming, and team sports like basketball and soccer. Ask your health care provider which exercises are safe for you.  Do not use any products that contain nicotine or tobacco, such as cigarettes and e-cigarettes. If you need help quitting, ask your health care provider.  Limit alcohol intake  to no more than 1 drink a day for nonpregnant women and 2 drinks a day for men. One drink equals 12 oz of beer, 5 oz of wine, or 1 oz of hard liquor.  Try to get 7-9 hours of sleep each night. What other changes can be made?  Keep a food and activity journal to keep track of: ? What you ate and how many calories you had. Remember to count sauces, dressings, and side dishes. ? Whether you were active, and what exercises you did. ? Your calorie, weight, and activity goals.  Check your weight regularly. Track any changes. If you notice you have gained weight, make changes to your diet or activity routine.  Avoid taking weight-loss medicines or supplements. Talk to your health care provider before starting any new  medicine or supplement.  Talk to your health care provider before trying any new diet or exercise plan. Why are these changes important? Eating healthy, staying active, and having healthy habits not only help prevent obesity, they also:  Help you to manage stress and emotions.  Help you to connect with friends and family.  Improve your self-esteem.  Improve your sleep.  Prevent long-term health problems.  What can happen if changes are not made? Being obese or overweight can cause you to develop joint or bone problems, which can make it hard for you to stay active or do activities you enjoy. Being obese or overweight also puts stress on your heart and lungs and can lead to health problems like diabetes, heart disease, and some cancers. Where to find more information: Talk with your health care provider or a dietitian about healthy eating and healthy lifestyle choices. You may also find other information through these resources:  U.S. Department of Agriculture MyPlate: FormerBoss.no  American Heart Association: www.heart.org  Centers for Disease Control and Prevention: http://www.wolf.info/  Summary  Staying at a healthy weight is important. It helps prevent certain  diseases and health problems, such as heart disease, diabetes, joint problems, sleep disorders, and some cancers.  Being obese or overweight can cause you to develop joint or bone problems, which can make it hard for you to stay active or do activities you enjoy.  You can prevent unhealthy weight gain by eating a healthy diet, exercising regularly, not smoking, limiting alcohol, and getting enough sleep.  Talk with your health care provider or a dietitian for guidance about healthy eating and healthy lifestyle choices. This information is not intended to replace advice given to you by your health care provider. Make sure you discuss any questions you have with your health care provider. Document Released: 12/23/2015 Document Revised: 01/28/2016 Document Reviewed: 01/28/2016 Elsevier Interactive Patient Education  [employer].

## 2017-07-15 ENCOUNTER — Other Ambulatory Visit: Payer: Self-pay | Admitting: Family Medicine

## 2017-07-15 ENCOUNTER — Encounter: Payer: Self-pay | Admitting: Family Medicine

## 2017-07-15 DIAGNOSIS — R809 Proteinuria, unspecified: Secondary | ICD-10-CM | POA: Insufficient documentation

## 2017-07-15 DIAGNOSIS — E7800 Pure hypercholesterolemia, unspecified: Secondary | ICD-10-CM

## 2017-07-15 LAB — LIPID PANEL
CHOLESTEROL: 184 mg/dL (ref <200)
HDL: 44 mg/dL — ABNORMAL LOW (ref >50)
LDL Cholesterol (Calc): 103 mg/dL (calc) — ABNORMAL HIGH
NON-HDL CHOLESTEROL (CALC): 140 mg/dL — AB (ref <130)
Total CHOL/HDL Ratio: 4.2 (calc) (ref <5.0)
Triglycerides: 241 mg/dL — ABNORMAL HIGH (ref <150)

## 2017-07-15 LAB — URINALYSIS W MICROSCOPIC + REFLEX CULTURE
Bacteria, UA: NONE SEEN /HPF
Bilirubin Urine: NEGATIVE
GLUCOSE, UA: NEGATIVE
HGB URINE DIPSTICK: NEGATIVE
HYALINE CAST: NONE SEEN /LPF
Ketones, ur: NEGATIVE
LEUKOCYTE ESTERASE: NEGATIVE
NITRITES URINE, INITIAL: NEGATIVE
Specific Gravity, Urine: 1.031 (ref 1.001–1.03)
Squamous Epithelial / LPF: NONE SEEN /HPF (ref <=5)

## 2017-07-15 LAB — COMPLETE METABOLIC PANEL WITH GFR
AG Ratio: 1.4 (calc) (ref 1.0–2.5)
ALKALINE PHOSPHATASE (APISO): 84 U/L (ref 33–130)
ALT: 23 U/L (ref 6–29)
AST: 20 U/L (ref 10–35)
Albumin: 4.2 g/dL (ref 3.6–5.1)
BILIRUBIN TOTAL: 0.7 mg/dL (ref 0.2–1.2)
BUN: 24 mg/dL (ref 7–25)
CHLORIDE: 100 mmol/L (ref 98–110)
CO2: 30 mmol/L (ref 20–32)
Calcium: 9 mg/dL (ref 8.6–10.4)
Creat: 0.68 mg/dL (ref 0.50–0.99)
GFR, Est African American: 104 mL/min/{1.73_m2} (ref >=60)
GFR, Est Non African American: 90 mL/min/{1.73_m2} (ref >=60)
Globulin: 2.9 g/dL (calc) (ref 1.9–3.7)
Glucose, Bld: 103 mg/dL (ref 65–139)
Potassium: 4.2 mmol/L (ref 3.5–5.3)
Sodium: 139 mmol/L (ref 135–146)
Total Protein: 7.1 g/dL (ref 6.1–8.1)

## 2017-07-15 LAB — MICROALBUMIN / CREATININE URINE RATIO
Creatinine, Urine: 163 mg/dL (ref 20–275)
MICROALB UR: 55.5 mg/dL
MICROALB/CREAT RATIO: 340 ug/mg{creat} — AB (ref <30)

## 2017-07-15 LAB — HEMOGLOBIN A1C
EAG (MMOL/L): 6.3 (calc)
HEMOGLOBIN A1C: 5.6 %{Hb} (ref <5.7)
Mean Plasma Glucose: 114 (calc)

## 2017-07-15 LAB — NO CULTURE INDICATED

## 2017-07-15 MED ORDER — ATORVASTATIN CALCIUM 80 MG PO TABS: 80.0000 mg | ORAL_TABLET | Freq: Every day | ORAL | 3 refills | Status: DC

## 2017-07-15 NOTE — Progress Notes (Signed)
Refer to nephrologist Increase statin Recheck lipids in 6-8 weeks

## 2017-07-15 NOTE — Assessment & Plan Note (Signed)
Refer to nephrologist 

## 2017-08-23 DIAGNOSIS — I10 Essential (primary) hypertension: Secondary | ICD-10-CM | POA: Diagnosis not present

## 2017-08-23 DIAGNOSIS — R809 Proteinuria, unspecified: Secondary | ICD-10-CM | POA: Diagnosis not present

## 2017-08-25 ENCOUNTER — Other Ambulatory Visit: Payer: Self-pay | Admitting: Nephrology

## 2017-08-25 DIAGNOSIS — R809 Proteinuria, unspecified: Secondary | ICD-10-CM

## 2017-08-31 ENCOUNTER — Other Ambulatory Visit: Payer: Self-pay | Admitting: Family Medicine

## 2017-08-31 ENCOUNTER — Ambulatory Visit
Admission: RE | Admit: 2017-08-31 | Discharge: 2017-08-31 | Disposition: A | Discharge status: Home / Self Care | Payer: Medicare Other | Source: Ambulatory Visit | Attending: Nephrology | Admitting: Nephrology

## 2017-08-31 DIAGNOSIS — R232 Flushing: Secondary | ICD-10-CM

## 2017-08-31 DIAGNOSIS — R809 Proteinuria, unspecified: Secondary | ICD-10-CM | POA: Insufficient documentation

## 2017-09-01 DIAGNOSIS — M65332 Trigger finger, left middle finger: Secondary | ICD-10-CM | POA: Diagnosis not present

## 2017-09-01 DIAGNOSIS — M65342 Trigger finger, left ring finger: Secondary | ICD-10-CM | POA: Diagnosis not present

## 2017-09-08 DIAGNOSIS — Z780 Asymptomatic menopausal state: Secondary | ICD-10-CM | POA: Diagnosis not present

## 2017-09-20 ENCOUNTER — Encounter: Payer: Self-pay | Admitting: Urology

## 2017-09-20 ENCOUNTER — Ambulatory Visit (INDEPENDENT_AMBULATORY_CARE_PROVIDER_SITE_OTHER): Payer: Medicare Other | Admitting: Urology

## 2017-09-20 VITALS — BP 131/84 | HR 93 | Ht 63.0 in | Wt 198.8 lb

## 2017-09-20 DIAGNOSIS — R350 Frequency of micturition: Secondary | ICD-10-CM | POA: Diagnosis not present

## 2017-09-20 DIAGNOSIS — R351 Nocturia: Secondary | ICD-10-CM

## 2017-09-20 LAB — BLADDER SCAN AMB NON-IMAGING: Scan Result: 24

## 2017-09-20 NOTE — Progress Notes (Signed)
09/20/2017 6:22 PM   Pamela Dodson [birth year] [id number]  Referring provider: Arnetha Courser, MD [address] [place],  [address]  Chief Complaint  Patient presents with  . Urinary Frequency    HPI: Patient is a 67 -year-old Caucasian female who is referred to [place] by Dr. Enid Derry for urinary frequency.    She states that she is up every 2-1/2 to 3 hours at night to urinate.  She states that she can fall asleep quite readily after getting up and urinating, but she has read recently that is important for good health to get a good nights restorative sleep.  She is not having associated urinary frequency, urgency, dysuria, intermittency, hesitancy, straining to urinate or weak urinary stream.   Patient denies any gross hematuria, dysuria or suprapubic/flank pain.  Patient denies any fevers, chills, nausea or vomiting.   She does not have a history of urinary tract infections, STI's, nephrolithiasis, GU surgery or GU trauma.   She is post menopausal.   She admits to an occasional diarrhea.   RUS on 08/31/2017 was normal.  Her PVR is 24 mL.    She is drinking half the recommended of water daily.   She drinks 10 ounces of green tea in the morning.  She drinks Ginger Ale daily.  She is drinking tart cherry juice or orange juice in am.  No coffee.  She drinks an occasional alcohol.     PMH: Past Medical History:  Diagnosis Date  . Arthritis   . Blood transfusion   . Cancer (HCC)    hx thyroid cancer  . Full code status 11/02/2016  . GERD (gastroesophageal reflux disease)   . Hypertension   . Hypothyroidism   . Neuromuscular disorder (HCC)    numbnes lower legs s/p knee replacements  . PONV (postoperative nausea and vomiting)    usually needs zofran/antiemetic prior to surgery  . Thyroid cancer ([place])   . Wears contact lenses     Surgical History: Past Surgical History:  Procedure Laterality Date  . APPENDECTOMY    . CARPAL TUNNEL RELEASE Left  2016  . CESAREAN SECTION    . CHOLECYSTECTOMY    . COLON SURGERY     diverticulitic mass removed  . COLONOSCOPY WITH PROPOFOL N/A 08/15/2014   Procedure: COLONOSCOPY WITH PROPOFOL;  Surgeon: Lucilla Lame, MD;  Location: [facility];  Service: Endoscopy;  Laterality: N/A;  . GANGLION CYST EXCISION     L wrist  . JOINT REPLACEMENT    . knee arthroscopy    . THYROIDECTOMY  2009  . TOTAL HIP ARTHROPLASTY  12/16/2010   Procedure: TOTAL HIP ARTHROPLASTY;  Surgeon: Kerin Salen;  Location: [facility];  Service: Orthopedics;  Laterality: Left;  Depuy  . TOTAL KNEE ARTHROPLASTY  2009    Home Medications:  Allergies as of 09/20/2017   No Known Allergies     Medication List        Accurate as of 09/20/17 11:59 PM. Always use your most recent med list.          amLODipine-benazepril 10-20 MG capsule Commonly known as:  LOTREL Take 1 capsule by mouth daily. For blood pressure   aspirin EC 81 MG tablet Take 1 tablet (81 mg total) by mouth daily.   atorvastatin 80 MG tablet Commonly known as:  LIPITOR Take 1 tablet (80 mg total) by mouth daily.   Cholecalciferol 1000 units capsule Take 1 capsule by mouth daily.  Fish Oil 1000 MG Cpdr Take 1 capsule by mouth 2 (two) times daily.   indomethacin 25 MG capsule Commonly known as:  INDOCIN Take 25 mg by mouth 2 (two) times daily.   levothyroxine 175 MCG tablet Commonly known as:  SYNTHROID, LEVOTHROID Take 1 tablet (175 mcg total) by mouth daily before breakfast.   meloxicam 15 MG tablet Commonly known as:  MOBIC Take 15 mg by mouth as needed for pain.   multivitamin tablet Take 1 tablet by mouth daily.   REMIFEMIN PO Take 1 tablet by mouth 2 (two) times daily. Over the counter medication. For hot flashes.   venlafaxine XR 37.5 MG 24 hr capsule Commonly known as:  EFFEXOR-XR Take 1 capsule (37.5 mg total) by mouth daily with breakfast.   vitamin C 100 MG tablet Take 100 mg by mouth daily.       Allergies: No  Known Allergies  Family History: Family History  Problem Relation Age of Onset  . Cancer Mother        lung  . Myelodysplastic syndrome Mother   . Hypothyroidism Mother   . Hypertension Brother   . Stroke Brother   . Atrial fibrillation Brother   . Birth defects Son        congenital genetic abnormality  . Dementia Maternal Grandfather   . Heart disease Paternal Grandmother        heart disease in her 16's  . Stroke Paternal Grandmother   . Heart attack Paternal Grandfather   . Heart disease Paternal Aunt   . Heart attack Paternal Aunt   . Stroke Paternal Aunt   . Atrial fibrillation Paternal Uncle     Social History:  reports that she has never smoked. She has never used smokeless tobacco. She reports that she drinks about 1.0 standard drinks of alcohol per week. She reports that she does not use drugs.  ROS: UROLOGY Frequent Urination?: No Hard to postpone urination?: No Burning/pain with urination?: No Get up at night to urinate?: Yes Leakage of urine?: No Urine stream starts and stops?: No Trouble starting stream?: No Do you have to strain to urinate?: No Blood in urine?: No Urinary tract infection?: No Sexually transmitted disease?: No Injury to kidneys or bladder?: No Painful intercourse?: No Weak stream?: No Currently pregnant?: No Vaginal bleeding?: No Last menstrual period?: no  Gastrointestinal Nausea?: No Vomiting?: No Indigestion/heartburn?: Yes Diarrhea?: Yes Constipation?: No  Constitutional Fever: No Night sweats?: No Weight loss?: No Fatigue?: No  Skin Skin rash/lesions?: No Itching?: No  Eyes Blurred vision?: No Double vision?: No  Ears/Nose/Throat Sore throat?: No Sinus problems?: No  Hematologic/Lymphatic Swollen glands?: No Easy bruising?: No  Cardiovascular Leg swelling?: No Chest pain?: No  Respiratory Cough?: No Shortness of breath?: No  Endocrine Excessive thirst?: No  Musculoskeletal Back pain?:  Yes Joint pain?: Yes  Neurological Headaches?: No Dizziness?: No  Psychologic Depression?: No Anxiety?: No  Physical Exam: BP 131/84 (BP Location: Left Arm, Patient Position: Sitting, Cuff Size: Normal)   Pulse 93   Ht 5\' 3"  (1.6 m)   Wt 198 lb 12.8 oz (90.2 kg)   BMI 35.22 kg/m   Constitutional: Well nourished. Alert and oriented, No acute distress. HEENT: [place] AT, moist mucus membranes. Trachea midline, no masses. Cardiovascular: No clubbing, cyanosis, or edema. Respiratory: Normal respiratory effort, no increased work of breathing. Skin: No rashes, bruises or suspicious lesions. Lymph: No cervical or inguinal adenopathy. Neurologic: Grossly intact, no focal deficits, moving all 4 extremities. Psychiatric: Normal  mood and affect.  Laboratory Data: Lab Results  Component Value Date   WBC 5.4 01/06/2016   HGB 13.8 01/06/2016   HCT 41.9 01/06/2016   MCV 94.2 01/06/2016   PLT 279 01/06/2016    Lab Results  Component Value Date   CREATININE 0.68 07/14/2017    No results found for: PSA  No results found for: TESTOSTERONE  Lab Results  Component Value Date   HGBA1C 5.6 07/14/2017    Lab Results  Component Value Date   TSH 2.50 05/03/2017       Component Value Date/Time   CHOL 184 07/14/2017 1420   CHOL 193 08/14/2014 1046   HDL 44 (L) 07/14/2017 1420   HDL 41 08/14/2014 1046   CHOLHDL 4.2 07/14/2017 1420   VLDL 30 01/06/2016 1404   LDLCALC 103 (H) 07/14/2017 1420    Lab Results  Component Value Date   AST 20 07/14/2017   Lab Results  Component Value Date   ALT 23 07/14/2017   No components found for: ALKALINEPHOPHATASE No components found for: BILIRUBINTOTAL  No results found for: ESTRADIOL  Urinalysis    Component Value Date/Time   COLORURINE DARK YELLOW 07/14/2017 1420   APPEARANCEUR CLEAR 07/14/2017 1420   LABSPEC 1.031 07/14/2017 1420   PHURINE < OR = 5.0 07/14/2017 1420   GLUCOSEU NEGATIVE 07/14/2017 1420   HGBUR NEGATIVE  07/14/2017 1420   KETONESUR NEGATIVE 07/14/2017 1420   PROTEINUR 2+ (A) 07/14/2017 1420    I have reviewed the labs.   Pertinent Imaging: CLINICAL DATA:  Proteinuria  EXAM: RENAL / URINARY TRACT ULTRASOUND COMPLETE  COMPARISON:  CT 06/24/2009.  FINDINGS: Right Kidney:  Length: 11.4 cm. Echogenicity within normal limits. No mass or hydronephrosis visualized.  Left Kidney:  Length: 11.7 cm. Echogenicity within normal limits. No mass or hydronephrosis visualized.  Bladder:  Appears normal for degree of bladder distention.  IMPRESSION: No acute or focal abnormality identified.   Electronically Signed   By: Marcello Moores  Register   On: 09/01/2017 08:13  Results for AERIE, DONICA (MRN [id number]) as of 09/25/2017 18:17  Ref. Range 09/20/2017 13:56  Scan Result Unknown 24  I have independently reviewed the films.    Assessment & Plan:    1. Nocturia - I explained to the patient that nocturia is often multi-factorial and difficult to treat.  Sleeping disorders, heart conditions, peripheral vascular disease, diabetes, an enlarged prostate for men, an urethral stricture causing bladder outlet obstruction and/or certain medications can contribute to nocturia. - I have suggested that the patient avoid caffeine after noon and alcohol in the evening.  He or she may also benefit from fluid restrictions after 6:00 in the evening and voiding just prior to bedtime. - I have explained that research studies have showed that over 84% of patients with sleep apnea reported frequent nighttime urination.   With sleep apnea, oxygen decreases, carbon dioxide increases, the blood become more acidic, the heart rate drops and blood vessels in the lung constrict.  The body is then alerted that something is very wrong. The sleeper must wake enough to reopen the airway. By this time, the heart is racing and experiences a false signal of fluid overload. The heart excretes a hormone-like protein  that tells the body to get rid of sodium and water, resulting in nocturia. -  I also informed the patient that a recent study noted that decreasing sodium intake to 2.3 grams daily, if they don't have issues with hyponatremia, can also reduce the  number of nightly voids - There is also an increased incidence in sleep apnea with menopause, symptoms include night sweats, daytime sleepiness, depressed mood, and cognitive complaints like poor concentration or problems with short-term memory  - The patient may benefit from a discussion with his or her primary care physician to see if he or she has risk factors for sleep apnea or other sleep disturbances and obtaining a sleep study. - Bladder Scan (Post Void Residual) in office As she states that she does not urinate much during the day, I have advised her to sit on the toilet every 2 hours while she is awake and in effort to retrain her bladder to daytime voiding We also discussed weight loss may be beneficial in reducing her nighttime voiding as if she may have sleep apnea weight loss may help improve that She is not interested in undergoing a sleep study at this time or starting any medication to help with the nocturia, but she would rather try behavioral and dietary methods to see if she can reduce her nighttime voiding  Return if symptoms worsen or fail to improve.  These notes generated with voice recognition software. I apologize for typographical errors.  Zara Council, PA-C  [facility] [address] [place], [place] [address] [phone]

## 2017-09-20 NOTE — Patient Instructions (Addendum)
DASH Eating Plan DASH stands for "Dietary Approaches to Stop Hypertension." The DASH eating plan is a healthy eating plan that has been shown to reduce high blood pressure (hypertension). It may also reduce your risk for type 2 diabetes, heart disease, and stroke. The DASH eating plan may also help with weight loss. What are tips for following this plan? General guidelines  Avoid eating more than 2,300 mg (milligrams) of salt (sodium) a day. If you have hypertension, you may need to reduce your sodium intake to 1,500 mg a day.  Limit alcohol intake to no more than 1 drink a day for nonpregnant women and 2 drinks a day for men. One drink equals 12 oz of beer, 5 oz of wine, or 1 oz of hard liquor.  Work with your health care provider to maintain a healthy body weight or to lose weight. Ask what an ideal weight is for you.  Get at least 30 minutes of exercise that causes your heart to beat faster (aerobic exercise) most days of the week. Activities may include walking, swimming, or biking.  Work with your health care provider or diet and nutrition specialist (dietitian) to adjust your eating plan to your individual calorie needs. Reading food labels  Check food labels for the amount of sodium per serving. Choose foods with less than 5 percent of the Daily Value of sodium. Generally, foods with less than 300 mg of sodium per serving fit into this eating plan.  To find whole grains, look for the word "whole" as the first word in the ingredient list. Shopping  Buy products labeled as "low-sodium" or "no salt added."  Buy fresh foods. Avoid canned foods and premade or frozen meals. Cooking  Avoid adding salt when cooking. Use salt-free seasonings or herbs instead of table salt or sea salt. Check with your health care provider or pharmacist before using salt substitutes.  Do not fry foods. Cook foods using healthy methods such as baking, boiling, grilling, and broiling instead.  Cook with  heart-healthy oils, such as olive, canola, soybean, or sunflower oil. Meal planning   Eat a balanced diet that includes: ? 5 or more servings of fruits and vegetables each day. At each meal, try to fill half of your plate with fruits and vegetables. ? Up to 6-8 servings of whole grains each day. ? Less than 6 oz of lean meat, poultry, or fish each day. A 3-oz serving of meat is about the same size as a deck of cards. One egg equals 1 oz. ? 2 servings of low-fat dairy each day. ? A serving of nuts, seeds, or beans 5 times each week. ? Heart-healthy fats. Healthy fats called Omega-3 fatty acids are found in foods such as flaxseeds and coldwater fish, like sardines, salmon, and mackerel.  Limit how much you eat of the following: ? Canned or prepackaged foods. ? Food that is high in trans fat, such as fried foods. ? Food that is high in saturated fat, such as fatty meat. ? Sweets, desserts, sugary drinks, and other foods with added sugar. ? Full-fat dairy products.  Do not salt foods before eating.  Try to eat at least 2 vegetarian meals each week.  Eat more home-cooked food and less restaurant, buffet, and fast food.  When eating at a restaurant, ask that your food be prepared with less salt or no salt, if possible. What foods are recommended? The items listed may not be a complete list. Talk with your dietitian about what   dietary choices are best for you. Grains Whole-grain or whole-wheat bread. Whole-grain or whole-wheat pasta. Brown rice. Oatmeal. Quinoa. Bulgur. Whole-grain and low-sodium cereals. Pita bread. Low-fat, low-sodium crackers. Whole-wheat flour tortillas. Vegetables Fresh or frozen vegetables (raw, steamed, roasted, or grilled). Low-sodium or reduced-sodium tomato and vegetable juice. Low-sodium or reduced-sodium tomato sauce and tomato paste. Low-sodium or reduced-sodium canned vegetables. Fruits All fresh, dried, or frozen fruit. Canned fruit in natural juice (without  added sugar). Meat and other protein foods Skinless chicken or turkey. Ground chicken or turkey. Pork with fat trimmed off. Fish and seafood. Egg whites. Dried beans, peas, or lentils. Unsalted nuts, nut butters, and seeds. Unsalted canned beans. Lean cuts of beef with fat trimmed off. Low-sodium, lean deli meat. Dairy Low-fat (1%) or fat-free (skim) milk. Fat-free, low-fat, or reduced-fat cheeses. Nonfat, low-sodium ricotta or cottage cheese. Low-fat or nonfat yogurt. Low-fat, low-sodium cheese. Fats and oils Soft margarine without trans fats. Vegetable oil. Low-fat, reduced-fat, or light mayonnaise and salad dressings (reduced-sodium). Canola, safflower, olive, soybean, and sunflower oils. Avocado. Seasoning and other foods Herbs. Spices. Seasoning mixes without salt. Unsalted popcorn and pretzels. Fat-free sweets. What foods are not recommended? The items listed may not be a complete list. Talk with your dietitian about what dietary choices are best for you. Grains Baked goods made with fat, such as croissants, muffins, or some breads. Dry pasta or rice meal packs. Vegetables Creamed or fried vegetables. Vegetables in a cheese sauce. Regular canned vegetables (not low-sodium or reduced-sodium). Regular canned tomato sauce and paste (not low-sodium or reduced-sodium). Regular tomato and vegetable juice (not low-sodium or reduced-sodium). Pickles. Olives. Fruits Canned fruit in a light or heavy syrup. Fried fruit. Fruit in cream or butter sauce. Meat and other protein foods Fatty cuts of meat. Ribs. Fried meat. Bacon. Sausage. Bologna and other processed lunch meats. Salami. Fatback. Hotdogs. Bratwurst. Salted nuts and seeds. Canned beans with added salt. Canned or smoked fish. Whole eggs or egg yolks. Chicken or turkey with skin. Dairy Whole or 2% milk, cream, and half-and-half. Whole or full-fat cream cheese. Whole-fat or sweetened yogurt. Full-fat cheese. Nondairy creamers. Whipped toppings.  Processed cheese and cheese spreads. Fats and oils Butter. Stick margarine. Lard. Shortening. Ghee. Bacon fat. Tropical oils, such as coconut, palm kernel, or palm oil. Seasoning and other foods Salted popcorn and pretzels. Onion salt, garlic salt, seasoned salt, table salt, and sea salt. Worcestershire sauce. Tartar sauce. Barbecue sauce. Teriyaki sauce. Soy sauce, including reduced-sodium. Steak sauce. Canned and packaged gravies. Fish sauce. Oyster sauce. Cocktail sauce. Horseradish that you find on the shelf. Ketchup. Mustard. Meat flavorings and tenderizers. Bouillon cubes. Hot sauce and Tabasco sauce. Premade or packaged marinades. Premade or packaged taco seasonings. Relishes. Regular salad dressings. Where to find more information:  National Heart, Lung, and Blood Institute: www.nhlbi.nih.gov  American Heart Association: www.heart.org Summary  The DASH eating plan is a healthy eating plan that has been shown to reduce high blood pressure (hypertension). It may also reduce your risk for type 2 diabetes, heart disease, and stroke.  With the DASH eating plan, you should limit salt (sodium) intake to 2,300 mg a day. If you have hypertension, you may need to reduce your sodium intake to 1,500 mg a day.  When on the DASH eating plan, aim to eat more fresh fruits and vegetables, whole grains, lean proteins, low-fat dairy, and heart-healthy fats.  Work with your health care provider or diet and nutrition specialist (dietitian) to adjust your eating plan to your individual   calorie needs. This information is not intended to replace advice given to you by your health care provider. Make sure you discuss any questions you have with your health care provider. Document Released: 12/10/2010 Document Revised: 12/15/2015 Document Reviewed: 12/15/2015 Elsevier Interactive Patient Education  2018 [employer].     Avoid caffeine after noon and alcohol in the evening and restrict fluids after 6:00 in  the evening and voiding just prior to bedtime. Reduce sodium intake to 2.3 grams daily Sit on the toilet every two hours while awake even if you do not feel the urge to urinate, this helps to retrain your bladder  It is important that you drink 6 to 8 glasses of water daily.  Half of the fluid intake should be water. Take most of your fluids before dinner. Don't drink too much liquid at once, sip instead of gulp.  There are certain beverages and foods that have known to the bladder irritants and they should be avoided or consumed infrequently. These include but not limited to beverages and foods containing caffeine, carbonated beverages, highly acidic or spicy foods, such as fruits, tomato products, artificial sweeteners and alcohol.  If you find it difficult to avoid these foods it may be helpful to alternate water in between consuming these foods.  It is important to stay well hydrated and eat 24 to 30 grams of fiber daily.    It has shown that even weight loss can help with urinary incontinence. In this study it demonstrated that and a percent weight loss and obese women (20 pound loss for a 250 pound woman) cut the number of incontinence episodes nearly in half.

## 2017-09-22 DIAGNOSIS — I10 Essential (primary) hypertension: Secondary | ICD-10-CM | POA: Diagnosis not present

## 2017-09-22 DIAGNOSIS — R809 Proteinuria, unspecified: Secondary | ICD-10-CM | POA: Diagnosis not present

## 2017-09-22 DIAGNOSIS — R600 Localized edema: Secondary | ICD-10-CM | POA: Diagnosis not present

## 2017-09-22 DIAGNOSIS — N182 Chronic kidney disease, stage 2 (mild): Secondary | ICD-10-CM | POA: Diagnosis not present

## 2017-09-26 ENCOUNTER — Other Ambulatory Visit: Payer: Self-pay | Admitting: Family Medicine

## 2017-09-26 DIAGNOSIS — R232 Flushing: Secondary | ICD-10-CM

## 2017-09-27 ENCOUNTER — Ambulatory Visit: Payer: Self-pay

## 2017-10-05 ENCOUNTER — Telehealth: Payer: Self-pay

## 2017-10-05 NOTE — Telephone Encounter (Signed)
Thank you so much

## 2017-10-05 NOTE — Telephone Encounter (Signed)
Copied from [place] [phone]. 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 She wants to know what she should do in this case.    Please advise

## 2017-10-05 NOTE — Telephone Encounter (Signed)
Have her call the health department

## 2017-10-05 NOTE — Telephone Encounter (Signed)
I contacted this patient and she stated that she spoke to the [facility] and was told by our local health department that it would not hurt to get another vaccine.   She stated that they are actually going to be coming to their campus 2/week to vaccine anyone who wants to get another MMR.  She stated that she will speak with them and decide if she needs it or not being that she was exposed to the student who had it and it has been over 9yr since she has been vaccinated.  She asked me to share this information with Dr. LSanda Kleinand I stated that I would.

## 2017-10-14 ENCOUNTER — Other Ambulatory Visit: Payer: Self-pay | Admitting: Family Medicine

## 2017-10-14 DIAGNOSIS — R232 Flushing: Secondary | ICD-10-CM

## 2017-10-25 DIAGNOSIS — Z1231 Encounter for screening mammogram for malignant neoplasm of breast: Secondary | ICD-10-CM | POA: Diagnosis not present

## 2017-10-25 LAB — HM MAMMOGRAPHY

## 2017-10-31 ENCOUNTER — Encounter: Payer: Self-pay | Admitting: Nurse Practitioner

## 2017-10-31 ENCOUNTER — Ambulatory Visit (INDEPENDENT_AMBULATORY_CARE_PROVIDER_SITE_OTHER): Payer: Medicare Other | Admitting: Nurse Practitioner

## 2017-10-31 VITALS — BP 110/70 | HR 90 | Temp 98.0°F | Resp 16 | Ht 63.0 in | Wt 194.8 lb

## 2017-10-31 DIAGNOSIS — L282 Other prurigo: Secondary | ICD-10-CM

## 2017-10-31 MED ORDER — CETIRIZINE HCL 10 MG PO TABS: 10.0000 mg | ORAL_TABLET | Freq: Every day | ORAL | 0 refills | Status: DC

## 2017-10-31 NOTE — Progress Notes (Signed)
vName: Pamela Dodson   MRN: [id number]    DOB: [birth year]   Date:10/31/2017       Progress Note  Subjective  Chief Complaint  Chief Complaint  Patient presents with  . Urticaria    x 3 weeks    HPI  Patient noticed itching started 3 weeks ago on neck then red spots an itching went to bilateral arms and legs. States last month increased her amlodipine and atorvastatin- but has lower medicines of these medicines without issue. No new make-up, creams, lotions, detergents.   Has tried benadryl and OTC hydrocortisone cream. States had this in the past and was given steroid shot and thought to be caused by anxiety. Denies increase in anxiety presently.   Patient Active Problem List   Diagnosis Date Noted  . Proteinuria 07/15/2017  . Urine test positive for microalbuminuria 07/15/2017  . Morbid obesity ([place]) 07/14/2017  . Nocturia 07/14/2017  . Full code status 11/02/2016  . Preventative health care 11/02/2016  . Prediabetes 07/12/2016  . Abnormal mammogram of left breast 07/21/2015  . Medication monitoring encounter 06/23/2015  . Shingles rash 01/22/2015  . Postsurgical intestinal bypass or anastomosis status   . Hypertension goal BP (blood pressure) < 150/90 08/07/2014  . Edema extremities 08/07/2014  . Gastro-esophageal reflux disease without esophagitis 08/07/2014  . History of knee replacement 08/07/2014  . H/O malignant neoplasm of thyroid 08/07/2014  . Hot flash, menopausal 08/07/2014  . Lumbar disc disease with radiculopathy 08/07/2014  . Dysmetabolic syndrome 36/64/4034  . Hypothyroidism, postop 08/07/2014  . Generalized OA 08/07/2014  . Hypercholesterolemia without hypertriglyceridemia 08/07/2014  . Hernia of anterior abdominal wall 08/07/2014  . Vestibular neuronitis 08/07/2014  . Cervical cancer screening 08/07/2014  . Arthritis of hip 12/16/2010    Past Medical History:  Diagnosis Date  . Arthritis   . Blood transfusion   . Cancer (HCC)    hx thyroid  cancer  . Full code status 11/02/2016  . GERD (gastroesophageal reflux disease)   . Hypertension   . Hypothyroidism   . Neuromuscular disorder (HCC)    numbnes lower legs s/p knee replacements  . PONV (postoperative nausea and vomiting)    usually needs zofran/antiemetic prior to surgery  . Thyroid cancer ([place])   . Wears contact lenses     Past Surgical History:  Procedure Laterality Date  . APPENDECTOMY    . CARPAL TUNNEL RELEASE Left 2016  . CESAREAN SECTION    . CHOLECYSTECTOMY    . COLON SURGERY     diverticulitic mass removed  . COLONOSCOPY WITH PROPOFOL N/A 08/15/2014   Procedure: COLONOSCOPY WITH PROPOFOL;  Surgeon: Lucilla Lame, MD;  Location: [facility];  Service: Endoscopy;  Laterality: N/A;  . GANGLION CYST EXCISION     L wrist  . JOINT REPLACEMENT    . knee arthroscopy    . THYROIDECTOMY  2009  . TOTAL HIP ARTHROPLASTY  12/16/2010   Procedure: TOTAL HIP ARTHROPLASTY;  Surgeon: Kerin Salen;  Location: [facility];  Service: Orthopedics;  Laterality: Left;  Depuy  . TOTAL KNEE ARTHROPLASTY  2009    Social History   Tobacco Use  . Smoking status: Never Smoker  . Smokeless tobacco: Never Used  Substance Use Topics  . Alcohol use: Yes    Alcohol/week: 1.0 standard drinks    Types: 1 Glasses of wine per week    Comment: occasional     Current Outpatient Medications:  .  amLODipine-benazepril (LOTREL) 10-20 MG capsule, Take 1 capsule  by mouth daily. For blood pressure, Disp: 90 capsule, Rfl: 3 .  Ascorbic Acid (VITAMIN C) 100 MG tablet, Take 100 mg by mouth daily., Disp: , Rfl:  .  atorvastatin (LIPITOR) 80 MG tablet, Take 1 tablet (80 mg total) by mouth daily., Disp: 90 tablet, Rfl: 3 .  Black Cohosh (REMIFEMIN PO), Take 1 tablet by mouth 2 (two) times daily. Over the counter medication. For hot flashes. , Disp: , Rfl:  .  Cholecalciferol 1000 UNITS capsule, Take 1 capsule by mouth daily., Disp: , Rfl:  .  levothyroxine (SYNTHROID) 175 MCG tablet, Take 1  tablet (175 mcg total) by mouth daily before breakfast., Disp: 90 tablet, Rfl: 3 .  meloxicam (MOBIC) 15 MG tablet, Take 15 mg by mouth as needed for pain., Disp: , Rfl:  .  Multiple Vitamin (MULTIVITAMIN) tablet, Take 1 tablet by mouth daily., Disp: , Rfl:  .  Omega-3 Fatty Acids (FISH OIL) 1000 MG CPDR, Take 1 capsule by mouth 2 (two) times daily., Disp: , Rfl:  .  venlafaxine XR (EFFEXOR-XR) 37.5 MG 24 hr capsule, Take 1 capsule (37.5 mg total) by mouth daily with breakfast., Disp: 90 capsule, Rfl: 3 .  aspirin EC 81 MG tablet, Take 1 tablet (81 mg total) by mouth daily., Disp: , Rfl:  .  cetirizine (ZYRTEC) 10 MG tablet, Take 1 tablet (10 mg total) by mouth daily., Disp: 30 tablet, Rfl: 0 .  indomethacin (INDOCIN) 25 MG capsule, Take 25 mg by mouth 2 (two) times daily., Disp: , Rfl:   No Known Allergies  ROS   No other specific complaints in a complete review of systems (except as listed in HPI above).  Objective  Vitals:   10/31/17 1515  BP: 110/70  Pulse: 90  Resp: 16  Temp: 98 F (36.7 C)  TempSrc: Oral  SpO2: 97%  Weight: 194 lb 12.8 oz (88.4 kg)  Height: 5\' 3"  (1.6 m)    Body mass index is 34.51 kg/m.  Nursing Note and Vital Signs reviewed.  Physical Exam  Constitutional: She is oriented to person, place, and time. She appears well-developed and well-nourished.  Eyes: Conjunctivae are normal.  Cardiovascular: Normal rate.  Pulmonary/Chest: Effort normal.  Neurological: She is alert and oriented to person, place, and time.  Skin:     Psychiatric: She has a normal mood and affect. Her behavior is normal. Judgment and thought content normal.      No results found for this or any previous visit (from the past 48 hour(s)).  Assessment & Plan  1. Pruritic rash - please take cetirizine 10mg  daily for one week, in addition to hydrocortisone cream, Capsaicin 0.75 cream. If unimproved can go up to 10mg  of cetirizine twice day for no more than one week.    Offered prednisone, declined at this time but will let us know in a week or so if needed. Declined referral to derm or allergist due to repeated cases with unknown triggers.  - cetirizine (ZYRTEC) 10 MG tablet; Take 1 tablet (10 mg total) by mouth daily.  Dispense: 30 tablet; Refill: 0

## 2017-10-31 NOTE — Patient Instructions (Addendum)
-   please take cetirizine 10mg  daily for one week, in addition to hydrocortisone cream, Capsaicin 0.75 cream. If unimproved can go up to 10mg  of cetirizine twice day for no more than one week.

## 2017-11-07 ENCOUNTER — Telehealth: Payer: Self-pay | Admitting: Family Medicine

## 2017-11-07 DIAGNOSIS — R21 Rash and other nonspecific skin eruption: Secondary | ICD-10-CM

## 2017-11-07 MED ORDER — PREDNISONE 10 MG (21) PO TBPK: ORAL_TABLET | ORAL | 0 refills | Status: DC

## 2017-11-07 NOTE — Telephone Encounter (Signed)
Prednisone taper sent to [facility] in [place]. Please be aware it does increase hunger; please eat healthy foods and drink plenty of water. Avoid taking NSAIDs when on this medication such as ibuprofen, naproxen, meloxicam. If having any side effects or worsening condition please let us know as soon as possible.

## 2017-11-07 NOTE — Telephone Encounter (Signed)
Copied from [facility] [id number]. Topic: Quick Communication - Rx Refill/Question >> Nov 07, 2017  2:40 PM Scherrie Gerlach wrote: Medication: a tapered steroid  Pt saw Benjamine Mola on 10/28 for hives. Pt instructed to call back if not better and a tapered steroid could be called in.  Pt states she is not better and requested the prednisone. [id number] - [place], [place] - [address]

## 2017-11-08 NOTE — Telephone Encounter (Signed)
Pt aware of message below

## 2017-11-29 ENCOUNTER — Telehealth: Payer: Self-pay | Admitting: Family Medicine

## 2017-11-29 NOTE — Telephone Encounter (Signed)
Spoke with patient today to get her scheduled for her AWV and she mentioned that she still has not received any reports on her Bone Density Exam that she had in September. Patient is requesting a call from the office with the results.

## 2017-11-30 NOTE — Telephone Encounter (Signed)
Pt.notified

## 2017-11-30 NOTE — Telephone Encounter (Signed)
Thank you so much Her DEXA scan looks great Please let her know that her bone strength is in the normal range We can consider her next DEXA scan in 2 years or perhaps longer based on the recommendations at that time (there are rumors that women with normal bone density readings may be able to spread them out longer, but we'll see what the news is in 2021)

## 2017-11-30 NOTE — Telephone Encounter (Signed)
Pt states got done at [facility] and it is under care everywhere for review.

## 2017-12-12 DIAGNOSIS — D2112 Benign neoplasm of connective and other soft tissue of left upper limb, including shoulder: Secondary | ICD-10-CM | POA: Diagnosis not present

## 2017-12-12 DIAGNOSIS — D1809 Hemangioma of other sites: Secondary | ICD-10-CM | POA: Diagnosis not present

## 2017-12-12 HISTORY — PX: GLOMUS TUMOR EXCISION: SUR521

## 2018-01-12 ENCOUNTER — Ambulatory Visit: Payer: Medicare Other | Admitting: Family Medicine

## 2018-01-13 ENCOUNTER — Ambulatory Visit: Payer: Medicare Other | Admitting: Family Medicine

## 2018-01-16 ENCOUNTER — Encounter: Payer: Self-pay | Admitting: Family Medicine

## 2018-01-16 ENCOUNTER — Ambulatory Visit (INDEPENDENT_AMBULATORY_CARE_PROVIDER_SITE_OTHER): Payer: Medicare Other | Admitting: Family Medicine

## 2018-01-16 VITALS — BP 118/68 | HR 99 | Temp 98.8°F | Ht 63.0 in | Wt 189.4 lb

## 2018-01-16 DIAGNOSIS — R7303 Prediabetes: Secondary | ICD-10-CM

## 2018-01-16 DIAGNOSIS — R232 Flushing: Secondary | ICD-10-CM

## 2018-01-16 DIAGNOSIS — K219 Gastro-esophageal reflux disease without esophagitis: Secondary | ICD-10-CM | POA: Diagnosis not present

## 2018-01-16 DIAGNOSIS — D1800 Hemangioma unspecified site: Secondary | ICD-10-CM

## 2018-01-16 DIAGNOSIS — Z5181 Encounter for therapeutic drug level monitoring: Secondary | ICD-10-CM

## 2018-01-16 DIAGNOSIS — E890 Postprocedural hypothyroidism: Secondary | ICD-10-CM

## 2018-01-16 DIAGNOSIS — I10 Essential (primary) hypertension: Secondary | ICD-10-CM | POA: Diagnosis not present

## 2018-01-16 DIAGNOSIS — E7800 Pure hypercholesterolemia, unspecified: Secondary | ICD-10-CM | POA: Diagnosis not present

## 2018-01-16 DIAGNOSIS — E669 Obesity, unspecified: Secondary | ICD-10-CM | POA: Insufficient documentation

## 2018-01-16 HISTORY — DX: Hemangioma unspecified site: D18.00

## 2018-01-16 MED ORDER — VENLAFAXINE HCL ER 37.5 MG PO CP24: 37.5000 mg | ORAL_CAPSULE | Freq: Every day | ORAL | 3 refills | Status: DC

## 2018-01-16 MED ORDER — LEVOTHYROXINE SODIUM 175 MCG PO TABS: 175.0000 ug | ORAL_TABLET | Freq: Every day | ORAL | 0 refills | Status: DC

## 2018-01-16 NOTE — Assessment & Plan Note (Signed)
Check nonfasting labs today (yogurt, chia seeds, blueberries 2-3 hours prior); keep up the great job with weight loss and healthy eating; continue walking

## 2018-01-16 NOTE — Progress Notes (Signed)
BP 118/68   Pulse 99   Temp 98.8 F (37.1 C) (Oral)   Ht 5\' 3"  (1.6 m)   Wt 189 lb 6.4 oz (85.9 kg)   SpO2 98%   BMI 33.55 kg/m    Subjective:    Patient ID: Pamela Dodson, female    DOB: [birth year], 68 y.o.   MRN: [id number]  HPI: Pamela Dodson is a 68 y.o. female  Chief Complaint  Patient presents with  . 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; seeing surgeon in [place]; recurs and sensitive to cold; last surgery was Dec 9th; not extremely sensitive now, getting better; she is right-handed  HTN; well-controlled on meds; kidney doctor just increased half of the Lotrel at last visit; not a salt shaker perrson  CKDseeing kidney doctor, doctor is aware of the meloxicam; only taking when really  needed  Losing weight, obesity; low carb diet; down 9 pounds or more when she started; this is intentional and working even through Christmas  Post-op hypothyroidism; moving BMs, good energy, no hair loss, moisturizer but not really dry skin  GERD; not taking medicine for a long time; used to take the PPI just once a month  High cholesterol; had yogurt and chia seeds  Prediabetes; no blood relatives with diabetes; no dry mouth or blurred vision  Occasional hot flashes; so much better on the SNRI  Lab Results  Component Value Date   CHOL 136 01/16/2018   HDL 39 (L) 01/16/2018   LDLCALC 74 01/16/2018   TRIG 145 01/16/2018   CHOLHDL 3.5 01/16/2018    Lab Results  Component Value Date   TSH 2.50 05/03/2017     Depression screen PHQ 2/9 01/16/2018 07/14/2017 01/13/2017 11/02/2016 07/12/2016  Decreased Interest 0 0 0 0 0  Down, Depressed, Hopeless 0 1 0 0 0  PHQ - 2 Score 0 1 0 0 0  Altered sleeping 0 3 - - -  Tired, decreased energy 0 0 - - -  Change in appetite 0 0 - - -  Feeling bad or failure about yourself  0 0 - - -  Trouble concentrating 0 0 - - -  Moving slowly or fidgety/restless 0 0 - - -  Suicidal  thoughts 0 0 - - -  PHQ-9 Score 0 4 - - -  Difficult doing work/chores Not difficult at all Not difficult at all - - -   Fall Risk  01/16/2018 10/31/2017 07/14/2017 01/13/2017 11/02/2016  Falls in the past year? 0 No No No No  Number falls in past yr: 0 - - - -  Injury with Fall? 0 - - - -    Relevant past medical, surgical, family and social history reviewed Past Medical History:  Diagnosis Date  . Arthritis   . Blood transfusion   . Cancer (HCC)    hx thyroid cancer  . Full code status 11/02/2016  . GERD (gastroesophageal reflux disease)   . 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 Marlou Starks, II in [place]  . Hypertension   . Hypothyroidism   . Neuromuscular disorder (HCC)    numbnes lower legs s/p knee replacements  . PONV (postoperative nausea and vomiting)    usually needs zofran/antiemetic prior to surgery  . Thyroid cancer ([place])   . Wears contact lenses    Past Surgical History:  Procedure Laterality Date  . APPENDECTOMY    . CARPAL TUNNEL RELEASE Left  2016  . CESAREAN SECTION    . CHOLECYSTECTOMY    . COLON SURGERY     diverticulitic mass removed  . COLONOSCOPY WITH PROPOFOL N/A 08/15/2014   Procedure: COLONOSCOPY WITH PROPOFOL;  Surgeon: Lucilla Lame, MD;  Location: [facility];  Service: Endoscopy;  Laterality: N/A;  . GANGLION CYST EXCISION     L wrist  . GLOMUS TUMOR EXCISION Left 12/12/2017  . JOINT REPLACEMENT    . knee arthroscopy    . THYROIDECTOMY  2009  . TOTAL HIP ARTHROPLASTY  12/16/2010   Procedure: TOTAL HIP ARTHROPLASTY;  Surgeon: Kerin Salen;  Location: [facility];  Service: Orthopedics;  Laterality: Left;  Depuy  . TOTAL KNEE ARTHROPLASTY  2009   Family History  Problem Relation Age of Onset  . Cancer Mother        lung  . Myelodysplastic syndrome Mother   . Hypothyroidism Mother   . Hypertension Brother   . Stroke Brother   . Atrial fibrillation Brother   . Birth defects Son        congenital genetic abnormality  . Dementia Maternal  Grandfather   . Heart disease Paternal Grandmother        heart disease in her 64's  . Stroke Paternal Grandmother   . Heart attack Paternal Grandfather   . Heart disease Paternal Aunt   . Heart attack Paternal Aunt   . Stroke Paternal Aunt   . Atrial fibrillation Paternal Uncle    Social History   Tobacco Use  . Smoking status: Never Smoker  . Smokeless tobacco: Never Used  Substance Use Topics  . Alcohol use: Yes    Alcohol/week: 1.0 standard drinks    Types: 1 Glasses of wine per week    Comment: occasional  . Drug use: No     Office Visit from 01/16/2018 in [facility]  AUDIT-C Score  1      Interim medical history since last visit reviewed. Allergies and medications reviewed  Review of Systems  Constitutional: Negative for unexpected weight change (she is working on this).  Respiratory: Negative for wheezing.   Cardiovascular: Negative for chest pain.   Per HPI unless specifically indicated above     Objective:    BP 118/68   Pulse 99   Temp 98.8 F (37.1 C) (Oral)   Ht 5\' 3"  (1.6 m)   Wt 189 lb 6.4 oz (85.9 kg)   SpO2 98%   BMI 33.55 kg/m   Wt Readings from Last 3 Encounters:  01/16/18 189 lb 6.4 oz (85.9 kg)  10/31/17 194 lb 12.8 oz (88.4 kg)  09/20/17 198 lb 12.8 oz (90.2 kg)    Physical Exam Constitutional:      General: She is not in acute distress.    Appearance: She is well-developed. She is not diaphoretic.  HENT:     Head: Normocephalic and atraumatic.  Eyes:     General: No scleral icterus. Neck:     Thyroid: No thyromegaly.  Cardiovascular:     Rate and Rhythm: Normal rate and regular rhythm.     Heart sounds: Normal heart sounds. No murmur.  Pulmonary:     Effort: Pulmonary effort is normal. No respiratory distress.     Breath sounds: Normal breath sounds. No wheezing.  Abdominal:     General: Bowel sounds are normal. There is no distension.     Palpations: Abdomen is soft.  Skin:    General: Skin is warm  and dry.  Coloration: Skin is not pale.  Neurological:     Mental Status: She is alert.  Psychiatric:        Behavior: Behavior normal.        Thought Content: Thought content normal.        Judgment: Judgment normal.     Results for orders placed or performed in visit on 01/16/18  COMPLETE METABOLIC PANEL WITH GFR  Result Value Ref Range   Glucose, Bld 98 65 - 99 mg/dL   BUN 14 7 - 25 mg/dL   Creat 0.62 0.50 - 0.99 mg/dL   GFR, Est Non African American 93 > OR = 60 mL/min/1.89m2   GFR, Est African American 107 > OR = 60 mL/min/1.33m2   BUN/Creatinine Ratio NOT APPLICABLE 6 - 22 (calc)   Sodium 141 135 - 146 mmol/L   Potassium 4.7 3.5 - 5.3 mmol/L   Chloride 101 98 - 110 mmol/L   CO2 30 20 - 32 mmol/L   Calcium 9.6 8.6 - 10.4 mg/dL   Total Protein 6.9 6.1 - 8.1 g/dL   Albumin 4.3 3.6 - 5.1 g/dL   Globulin 2.6 1.9 - 3.7 g/dL (calc)   AG Ratio 1.7 1.0 - 2.5 (calc)   Total Bilirubin 0.6 0.2 - 1.2 mg/dL   Alkaline phosphatase (APISO) 70 33 - 130 U/L   AST 22 10 - 35 U/L   ALT 22 6 - 29 U/L  Hemoglobin A1c  Result Value Ref Range   Hgb A1c MFr Bld 5.6 <5.7 % of total Hgb   Mean Plasma Glucose 114 (calc)   eAG (mmol/L) 6.3 (calc)  Lipid panel  Result Value Ref Range   Cholesterol 136 <200 mg/dL   HDL 39 (L) >50 mg/dL   Triglycerides 145 <150 mg/dL   LDL Cholesterol (Calc) 74 mg/dL (calc)   Total CHOL/HDL Ratio 3.5 <5.0 (calc)   Non-HDL Cholesterol (Calc) 97 <130 mg/dL (calc)      Assessment & Plan:   Problem List Items Addressed This Visit      Cardiovascular and Mediastinum   Hypertension goal BP (blood pressure) < 150/90    Working with nephrologist; trying to follow DASH guidelines      Relevant Medications   amLODipine-benazepril (LOTREL) 10-40 MG capsule     Digestive   Gastro-esophageal reflux disease without esophagitis    Well-controlled, no red flags        Endocrine   Hypothyroidism, postop    Stable, weight loss is intentional; will check in  early May      Relevant Medications   levothyroxine (SYNTHROID) 175 MCG tablet   Other Relevant Orders   TSH     Other   Medication monitoring encounter   Relevant Orders   COMPLETE METABOLIC PANEL WITH GFR (Completed)   Prediabetes - Primary    Check A1c today      Relevant Orders   Hemoglobin A1c (Completed)   Obesity (BMI 30.0-34.9)    Keep working on weight loss      Hypercholesterolemia without hypertriglyceridemia    Check nonfasting labs today (yogurt, chia seeds, blueberries 2-3 hours prior); keep up the great job with weight loss and healthy eating; continue walking      Relevant Medications   amLODipine-benazepril (LOTREL) 10-40 MG capsule   Other Relevant Orders   Lipid panel (Completed)   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 Sanda Klein.  An after-visit summary was printed and given to the patient at [facility].  Please see the patient instructions which may contain other information and recommendations beyond what is mentioned above in the assessment and plan.  Meds ordered this encounter  Medications  . levothyroxine (SYNTHROID) 175 MCG tablet    Sig: Take 1 tablet (175 mcg total) by mouth daily before breakfast.    Dispense:  90 tablet    Refill:  0    New dose  . venlafaxine XR (EFFEXOR-XR) 37.5 MG 24 hr capsule    Sig: Take 1 capsule (37.5 mg total) by mouth daily with breakfast.    Dispense:  90 capsule    Refill:  3    Orders Placed This Encounter  Procedures  . COMPLETE METABOLIC PANEL WITH GFR  . Hemoglobin A1c  . Lipid panel  . TSH

## 2018-01-16 NOTE — Assessment & Plan Note (Signed)
Working with nephrologist; trying to follow DASH guidelines

## 2018-01-16 NOTE — Assessment & Plan Note (Signed)
Well-controlled, no red flags

## 2018-01-16 NOTE — Assessment & Plan Note (Signed)
Middle finger of LEFT hand; recurrent; seeing specialist

## 2018-01-16 NOTE — Patient Instructions (Addendum)
Return just for thyroid labs only on or just after May 05, 2018 You don't need an appointment. Just come by between the hours of 8:00 am and noon or between 2:00 pm and 4:00 pm Monday through Friday.  Keep up the great job with your weight loss

## 2018-01-16 NOTE — Assessment & Plan Note (Signed)
Stable, weight loss is intentional; will check in early May

## 2018-01-16 NOTE — Assessment & Plan Note (Signed)
Check A1c today.

## 2018-01-16 NOTE — Assessment & Plan Note (Signed)
Keep working on weight loss 

## 2018-01-17 LAB — COMPLETE METABOLIC PANEL WITH GFR
AG Ratio: 1.7 (calc) (ref 1.0–2.5)
ALBUMIN MSPROF: 4.3 g/dL (ref 3.6–5.1)
ALT: 22 U/L (ref 6–29)
AST: 22 U/L (ref 10–35)
Alkaline phosphatase (APISO): 70 U/L (ref 33–130)
BUN: 14 mg/dL (ref 7–25)
CALCIUM: 9.6 mg/dL (ref 8.6–10.4)
CO2: 30 mmol/L (ref 20–32)
CREATININE: 0.62 mg/dL (ref 0.50–0.99)
Chloride: 101 mmol/L (ref 98–110)
GFR, EST NON AFRICAN AMERICAN: 93 mL/min/{1.73_m2} (ref >=60)
GFR, Est African American: 107 mL/min/{1.73_m2} (ref >=60)
GLOBULIN: 2.6 g/dL (ref 1.9–3.7)
Glucose, Bld: 98 mg/dL (ref 65–99)
Potassium: 4.7 mmol/L (ref 3.5–5.3)
SODIUM: 141 mmol/L (ref 135–146)
Total Bilirubin: 0.6 mg/dL (ref 0.2–1.2)
Total Protein: 6.9 g/dL (ref 6.1–8.1)

## 2018-01-17 LAB — HEMOGLOBIN A1C
Hgb A1c MFr Bld: 5.6 % of total Hgb (ref <5.7)
MEAN PLASMA GLUCOSE: 114 (calc)
eAG (mmol/L): 6.3 (calc)

## 2018-01-17 LAB — LIPID PANEL
CHOLESTEROL: 136 mg/dL (ref <200)
HDL: 39 mg/dL — AB (ref >50)
LDL Cholesterol (Calc): 74 mg/dL (calc)
NON-HDL CHOLESTEROL (CALC): 97 mg/dL (ref <130)
TRIGLYCERIDES: 145 mg/dL (ref <150)
Total CHOL/HDL Ratio: 3.5 (calc) (ref <5.0)

## 2018-03-19 ENCOUNTER — Other Ambulatory Visit: Payer: Self-pay | Admitting: Family Medicine

## 2018-03-20 ENCOUNTER — Encounter: Payer: Self-pay | Admitting: Nurse Practitioner

## 2018-03-20 ENCOUNTER — Other Ambulatory Visit: Payer: Self-pay

## 2018-03-20 ENCOUNTER — Ambulatory Visit (INDEPENDENT_AMBULATORY_CARE_PROVIDER_SITE_OTHER): Payer: Medicare Other | Admitting: Nurse Practitioner

## 2018-03-20 VITALS — BP 118/60 | HR 95 | Temp 99.2°F | Resp 14 | Ht 63.0 in | Wt 188.0 lb

## 2018-03-20 DIAGNOSIS — J101 Influenza due to other identified influenza virus with other respiratory manifestations: Secondary | ICD-10-CM

## 2018-03-20 DIAGNOSIS — R509 Fever, unspecified: Secondary | ICD-10-CM | POA: Diagnosis not present

## 2018-03-20 LAB — POCT INFLUENZA A/B
Influenza A, POC: POSITIVE — AB
Influenza B, POC: NEGATIVE

## 2018-03-20 MED ORDER — BENZONATATE 200 MG PO CAPS: 200.0000 mg | ORAL_CAPSULE | Freq: Three times a day (TID) | ORAL | 0 refills | Status: DC | PRN

## 2018-03-20 NOTE — Progress Notes (Signed)
Name: Pamela Dodson   MRN: [id number]    DOB: 1950-09-11   Date:03/20/2018       Progress Note  Subjective  Chief Complaint  Chief Complaint  Patient presents with  . URI    onset 3 days, symptoms include: body aches, fever, vomiting and diarrhea    HPI  Patient endorses sudden onset of fever, body aches, nausea, vomiting started saturday. Fever of 101.7 yesterday, fever broke today. Additionally has dry cough, rhinorrhea and mild sore throat. Was taking mucinex. Had diarrhea Fridaybut related that to stress. States feels improved today compared to yesterday.  Denies chest pain, shortness of breath.  PHQ2/9: Depression screen [facility] 2/9 03/20/2018 01/16/2018 07/14/2017 01/13/2017 11/02/2016  Decreased Interest 0 0 0 0 0  Down, Depressed, Hopeless 0 0 1 0 0  PHQ - 2 Score 0 0 1 0 0  Altered sleeping 0 0 3 - -  Tired, decreased energy 0 0 0 - -  Change in appetite 0 0 0 - -  Feeling bad or failure about yourself  0 0 0 - -  Trouble concentrating 0 0 0 - -  Moving slowly or fidgety/restless 0 0 0 - -  Suicidal thoughts 0 0 0 - -  PHQ-9 Score 0 0 4 - -  Difficult doing work/chores Not difficult at all Not difficult at all Not difficult at all - -    PHQ reviewed. Negative  Patient Active Problem List   Diagnosis Date Noted  . 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 Obesity (BMI 30.0-34.9) 01/16/2018  . Proteinuria 07/15/2017  . Urine test positive for microalbuminuria 07/15/2017  . Nocturia 07/14/2017  . Full code status 11/02/2016  . Preventative health care 11/02/2016  . Prediabetes 07/12/2016  . Abnormal mammogram of left breast 07/21/2015  . Medication monitoring encounter 06/23/2015  . Shingles rash 01/22/2015  . Postsurgical intestinal bypass or anastomosis status   . Hypertension goal BP (blood pressure) < 150/90 08/07/2014  . Edema extremities 08/07/2014  . Gastro-esophageal reflux disease without esophagitis 08/07/2014  . History of knee replacement 08/07/2014  . H/O  malignant neoplasm of thyroid 08/07/2014  . Hot flash, menopausal 08/07/2014  . Lumbar disc disease with radiculopathy 08/07/2014  . Dysmetabolic syndrome 40/10/2723  . Hypothyroidism, postop 08/07/2014  . Generalized OA 08/07/2014  . Hypercholesterolemia without hypertriglyceridemia 08/07/2014  . Hernia of anterior abdominal wall 08/07/2014  . Vestibular neuronitis 08/07/2014  . Cervical cancer screening 08/07/2014  . Arthritis of hip 12/16/2010    Past Medical History:  Diagnosis Date  . Arthritis   . Blood transfusion   . Cancer (HCC)    hx thyroid cancer  . Full code status 11/02/2016  . GERD (gastroesophageal reflux disease)   . 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 Marlou Starks, II in [place]  . Hypertension   . Hypothyroidism   . Neuromuscular disorder (HCC)    numbnes lower legs s/p knee replacements  . PONV (postoperative nausea and vomiting)    usually needs zofran/antiemetic prior to surgery  . Thyroid cancer ([place])   . Wears contact lenses     Past Surgical History:  Procedure Laterality Date  . APPENDECTOMY    . CARPAL TUNNEL RELEASE Left 2016  . CESAREAN SECTION    . CHOLECYSTECTOMY    . COLON SURGERY     diverticulitic mass removed  . COLONOSCOPY WITH PROPOFOL N/A 08/15/2014   Procedure: COLONOSCOPY WITH PROPOFOL;  Surgeon: Lucilla Lame, MD;  Location: [place];  Service: Endoscopy;  Laterality: N/A;  . GANGLION CYST EXCISION     L wrist  . GLOMUS TUMOR EXCISION Left 12/12/2017  . JOINT REPLACEMENT    . knee arthroscopy    . THYROIDECTOMY  2009  . TOTAL HIP ARTHROPLASTY  12/16/2010   Procedure: TOTAL HIP ARTHROPLASTY;  Surgeon: Kerin Salen;  Location: [place];  Service: Orthopedics;  Laterality: Left;  Depuy  . TOTAL KNEE ARTHROPLASTY  2009    Social History   Tobacco Use  . Smoking status: Never Smoker  . Smokeless tobacco: Never Used  Substance Use Topics  . Alcohol use: Yes    Alcohol/week: 1.0 standard drinks    Types: 1 Glasses of  wine per week    Comment: occasional     Current Outpatient Medications:  .  amLODipine-benazepril (LOTREL) 10-40 MG capsule, Take 1 capsule by mouth daily., Disp: , Rfl:  .  Ascorbic Acid (VITAMIN C) 100 MG tablet, Take 100 mg by mouth daily., Disp: , Rfl:  .  atorvastatin (LIPITOR) 80 MG tablet, Take 1 tablet (80 mg total) by mouth daily., Disp: 90 tablet, Rfl: 3 .  Black Cohosh (REMIFEMIN PO), Take 1 tablet by mouth 2 (two) times daily. Over the counter medication. For hot flashes. , Disp: , Rfl:  .  Cholecalciferol 1000 UNITS capsule, Take 1 capsule by mouth daily., Disp: , Rfl:  .  levothyroxine (SYNTHROID, LEVOTHROID) 175 MCG tablet, TAKE 1 TABLET BY MOUTH  DAILY BEFORE BREAKFAST, Disp: 90 tablet, Rfl: 0 .  meloxicam (MOBIC) 15 MG tablet, Take 15 mg by mouth daily as needed for pain., Disp: , Rfl:  .  Multiple Vitamin (MULTIVITAMIN) tablet, Take 1 tablet by mouth daily., Disp: , Rfl:  .  Omega-3 Fatty Acids (FISH OIL) 1000 MG CPDR, Take 1 capsule by mouth 2 (two) times daily., Disp: , Rfl:  .  venlafaxine XR (EFFEXOR-XR) 37.5 MG 24 hr capsule, Take 1 capsule (37.5 mg total) by mouth daily with breakfast., Disp: 90 capsule, Rfl: 3 .  aspirin EC 81 MG tablet, Take 1 tablet (81 mg total) by mouth daily., Disp: , Rfl:  .  cetirizine (ZYRTEC) 10 MG tablet, Take 1 tablet (10 mg total) by mouth daily. (Patient not taking: Reported on 03/20/2018), Disp: 30 tablet, Rfl: 0  No Known Allergies  ROS   No other specific complaints in a complete review of systems (except as listed in HPI above).  Objective  Vitals:   03/20/18 1057  BP: 118/60  Pulse: 95  Resp: 14  Temp: 99.2 F (37.3 C)  TempSrc: Oral  SpO2: 95%  Weight: 188 lb (85.3 kg)  Height: 5\' 3"  (1.6 m)    Body mass index is 33.3 kg/m.  Nursing Note and Vital Signs reviewed.  Physical Exam HENT:     Head: Normocephalic and atraumatic.     Right Ear: Hearing normal.     Left Ear: Hearing normal.     Nose: Congestion  present.     Right Sinus: No maxillary sinus tenderness or frontal sinus tenderness.     Left Sinus: No maxillary sinus tenderness or frontal sinus tenderness.     Mouth/Throat:     Mouth: Mucous membranes are moist.     Pharynx: Uvula midline. No oropharyngeal exudate or posterior oropharyngeal erythema.  Eyes:     General:        Right eye: No discharge.        Left eye: No discharge.     Conjunctiva/sclera: Conjunctivae normal.  Neck:     Musculoskeletal: Normal range of motion.  Cardiovascular:     Rate and Rhythm: Normal rate.  Pulmonary:     Effort: Pulmonary effort is normal.     Breath sounds: Normal breath sounds.  Lymphadenopathy:     Cervical: No cervical adenopathy.  Skin:    General: Skin is warm and dry.     Findings: No rash.  Neurological:     Mental Status: She is alert.  Psychiatric:        Judgment: Judgment normal.        Results for orders placed or performed in visit on 03/20/18 (from the past 48 hour(s))  POCT Influenza A/B     Status: Abnormal   Collection Time: 03/20/18 11:02 AM  Result Value Ref Range   Influenza A, POC Positive (A) Negative   Influenza B, POC Negative Negative    Assessment & Plan  1. Fever, unspecified fever cause - POCT Influenza A/B - benzonatate (TESSALON) 200 MG capsule; Take 1 capsule (200 mg total) by mouth 3 (three) times daily as needed for cough.  Dispense: 30 capsule; Refill: 0  2. Influenza A See AVS, states symptoms are improving, received flu shot, symptoms started Saturday out of window- offered antiemetic, declined, discussed ROC precations  - benzonatate (TESSALON) 200 MG capsule; Take 1 capsule (200 mg total) by mouth 3 (three) times daily as needed for cough.  Dispense: 30 capsule; Refill: 0

## 2018-03-20 NOTE — Patient Instructions (Addendum)
- Cephacol lozenges, rest, drink plenty of water ( at least 64 ounces daily), diet- full of protein, vitamin C, Vitamin A - If you develop shortness of breath, severe fatigue, fever/chills uncontrolled with tylenol and ibuprofen please let us know.   Other OTC meds that may be helpful:  For Fever/Pain: Acetaminophen every 6 hours as needed (maximum of 3000mg  a day). If you are still uncomfortable you can add ibuprofen OR naproxen  For coughing: try dextromethorphan for a cough suppressant, and/or a cool mist humidifier, lozenges  For sore throat: saline gargles, honey herbal tea, lozenges, throat spray  To dry out your nose: try an antihistamine like loratadine (non-sedating) or diphenhydramine (sedating) or others To relieve a stuffy nose: try a decongestant like flonase or try the neti pot To make blowing your nose easier and relieve chest congestion: guaifenesin 400mg  every 4-6 hours of guaifenesin ER [phone] mg every 12 hours. Do not take more than 2,400mg  a day.     Influenza, Adult Influenza, more commonly known as "the flu," is a viral infection that mainly affects the respiratory tract. The respiratory tract includes organs that help you breathe, such as the lungs, nose, and throat. The flu causes many symptoms similar to the common cold along with high fever and body aches. The flu spreads easily from person to person (is contagious). Getting a flu shot (influenza vaccination) every year is the best way to prevent the flu. What are the causes? This condition is caused by the influenza virus. You can get the virus by:  Breathing in droplets that are in the air from an infected person's cough or sneeze.  Touching something that has been exposed to the virus (has been contaminated) and then touching your mouth, nose, or eyes. What increases the risk? The following factors may make you more likely to get the flu:  Not washing or sanitizing your hands often.  Having close contact with  many people during cold and flu season.  Touching your mouth, eyes, or nose without first washing or sanitizing your hands.  Not getting a yearly (annual) flu shot. You may have a higher risk for the flu, including serious problems such as a lung infection (pneumonia), if you:  Are older than 65.  Are pregnant.  Have a weakened disease-fighting system (immune system). You may have a weakened immune system if you: ? Have HIV or AIDS. ? Are undergoing chemotherapy. ? Are taking medicines that reduce (suppress) the activity of your immune system.  Have a long-term (chronic) illness, such as heart disease, kidney disease, diabetes, or lung disease.  Have a liver disorder.  Are severely overweight (morbidly obese).  Have anemia. This is a condition that affects your red blood cells.  Have asthma. What are the signs or symptoms? Symptoms of this condition usually begin suddenly and last 4-14 days. They may include:  Fever and chills.  Headaches, body aches, or muscle aches.  Sore throat.  Cough.  Runny or stuffy (congested) nose.  Chest discomfort.  Poor appetite.  Weakness or fatigue.  Dizziness.  Nausea or vomiting. How is this diagnosed? This condition may be diagnosed based on:  Your symptoms and medical history.  A physical exam.  Swabbing your nose or throat and testing the fluid for the influenza virus. How is this treated? If the flu is diagnosed early, you can be treated with medicine that can help reduce how severe the illness is and how long it lasts (antiviral medicine). This may be given  by mouth (orally) or through an IV. Taking care of yourself at home can help relieve symptoms. Your health care provider may recommend:  Taking over-the-counter medicines.  Drinking plenty of fluids. In many cases, the flu goes away on its own. If you have severe symptoms or complications, you may be treated in a hospital. Follow these instructions at  home: Activity  Rest as needed and get plenty of sleep.  Stay home from work or school as told by your health care provider. Unless you are visiting your health care provider, avoid leaving home until your fever has been gone for 24 hours without taking medicine. Eating and drinking  Take an oral rehydration solution (ORS). This is a drink that is sold at pharmacies and retail stores.  Drink enough fluid to keep your urine pale yellow.  Drink clear fluids in small amounts as you are able. Clear fluids include water, ice chips, diluted fruit juice, and low-calorie sports drinks.  Eat bland, easy-to-digest foods in small amounts as you are able. These foods include bananas, applesauce, rice, lean meats, toast, and crackers.  Avoid drinking fluids that contain a lot of sugar or caffeine, such as energy drinks, regular sports drinks, and soda.  Avoid alcohol.  Avoid spicy or fatty foods. General instructions   Take over-the-counter and prescription medicines only as told by your health care provider.  Use a cool mist humidifier to add humidity to the air in your home. This can make it easier to breathe.  Cover your mouth and nose when you cough or sneeze.  Wash your hands with soap and water often, especially after you cough or sneeze. If soap and water are not available, use alcohol-based hand sanitizer.  Keep all follow-up visits as told by your health care provider. This is important. How is this prevented?   Get an annual flu shot. You may get the flu shot in late summer, fall, or winter. Ask your health care provider when you should get your flu shot.  Avoid contact with people who are sick during cold and flu season. This is generally fall and winter. Contact a health care provider if:  You develop new symptoms.  You have: ? Chest pain. ? Diarrhea. ? A fever.  Your cough gets worse.  You produce more mucus.  You feel nauseous or you vomit. Get help right away  if:  You develop shortness of breath or difficulty breathing.  Your skin or nails turn a bluish color.  You have severe pain or stiffness in your neck.  You develop a sudden headache or sudden pain in your face or ear.  You cannot eat or drink without vomiting. Summary  Influenza, more commonly known as "the flu," is a viral infection that primarily affects your respiratory tract.  Symptoms of the flu usually begin suddenly and last 4-14 days.  Getting an annual flu shot is the best way to prevent getting the flu.  Stay home from work or school as told by your health care provider. Unless you are visiting your health care provider, avoid leaving home until your fever has been gone for 24 hours without taking medicine.  Keep all follow-up visits as told by your health care provider. This is important. This information is not intended to replace advice given to you by your health care provider. Make sure you discuss any questions you have with your health care provider. Document Released: 12/19/1999 Document Revised: 06/08/2017 Document Reviewed: 06/08/2017 Elsevier Interactive Patient Education  2019  [employer].

## 2018-03-24 ENCOUNTER — Ambulatory Visit: Payer: Self-pay | Admitting: *Deleted

## 2018-03-24 NOTE — Telephone Encounter (Signed)
Summary: swollen gland    Pt called in and stated that she was seen Monday and tested positive for flu A. She now has a gland swollen and tender on left side of her neck under jaw line. She is still having congestion and A very low grade fever of 100.4. She would like to know how she should be treating this. She was pass the window to take the tamiflu. Pt did not take the meds,   [facility] in [place] number [phone]     Left message to call back

## 2018-03-24 NOTE — Telephone Encounter (Signed)
I called too Left detailed message Sorry that we are not able to see her urgently; do recommend urgent care to evaluate Use good hand hygiene after touching any surface, don't touch face, wear mask, etc. If not emergency, then stay out of emergency rooms where the sickest patients are going to be right now; please do go to urgent care

## 2018-03-24 NOTE — Telephone Encounter (Signed)
Patient reports she is doing well since visit and flu diagnosis she has been recovering. Patient reports a swollen lymph node appearing today. Reason for Disposition . [1] Single large node AND [2] size > 1 inch (2.5 cm) AND [3] fever  Answer Assessment - Initial Assessment Questions 1. LOCATION: "Where is the swollen node located?" "Is the matching node on the other side of the body also swollen?"      Left side- lower law line under the jawbone at ear 2. SIZE: "How big is the node?" (Inches or centimeters) (or compare to common objects such as pea, bean, marble, golf ball)      Gumball size 3. ONSET: "When did the swelling start?"      Today- 1 hour aog 4. NECK NODES: "Is there a sore throat, runny nose or other symptoms of a cold?"      No other symptoms- clear congestion 5. GROIN OR ARMPIT NODES: "Is there a sore, scratch, cut or painful red area on that arm or leg?"      no 6. FEVER: "Do you have a fever?" If so, ask: "What is it, how was it measured, and when did it start?"      2:00 today 99.4 7. CAUSE: "What do you think is causing the swollen lymph nodes?"     Residual infection due to flu 8. OTHER SYMPTOMS: "Do you have any other symptoms?"     No other symptoms 9. PREGNANCY: "Is there any chance you are pregnant?" "When was your last menstrual period?"     n/a  Protocols used: [place]

## 2018-03-24 NOTE — Telephone Encounter (Signed)
Made patient appt for Monday 03-27-2018 with NP

## 2018-03-24 NOTE — Telephone Encounter (Signed)
Patient was called at 3:30 from our office and was offered an appt for Monday due to no available slots left for the day.  She was concerned and felt like she needed to be seen today. I spoke with Raquel Sarna NP and she recommended fast med or urgent care if patient felt like she needed to be seen today.  Pt refused due to so many sick people/corona virus.  I told her that was her only option at this point.  I told patient if she felt that concerned she would need to be seen elsewhere or schedule for Monday and keep an eye on nodule/lymph node for tenderness or soreness, since it was just felt/noticed today.

## 2018-03-27 ENCOUNTER — Encounter: Payer: Self-pay | Admitting: Nurse Practitioner

## 2018-03-27 ENCOUNTER — Other Ambulatory Visit: Payer: Self-pay

## 2018-03-27 ENCOUNTER — Ambulatory Visit (INDEPENDENT_AMBULATORY_CARE_PROVIDER_SITE_OTHER): Payer: Medicare Other | Admitting: Nurse Practitioner

## 2018-03-27 VITALS — BP 116/60 | HR 78 | Temp 98.9°F | Resp 14 | Ht 63.0 in | Wt 190.6 lb

## 2018-03-27 DIAGNOSIS — R05 Cough: Secondary | ICD-10-CM

## 2018-03-27 DIAGNOSIS — R059 Cough, unspecified: Secondary | ICD-10-CM

## 2018-03-27 DIAGNOSIS — R0689 Other abnormalities of breathing: Secondary | ICD-10-CM

## 2018-03-27 MED ORDER — HYDROCOD POLST-CPM POLST ER 10-8 MG/5ML PO SUER: 5.0000 mL | Freq: Two times a day (BID) | ORAL | 0 refills | Status: DC | PRN

## 2018-03-27 MED ORDER — AMOXICILLIN-POT CLAVULANATE 875-125 MG PO TABS: 1.0000 | ORAL_TABLET | Freq: Two times a day (BID) | ORAL | 0 refills | Status: DC

## 2018-03-27 MED ORDER — AZITHROMYCIN 250 MG PO TABS: ORAL_TABLET | ORAL | 0 refills | Status: DC

## 2018-03-27 MED ORDER — ALBUTEROL SULFATE 108 (90 BASE) MCG/ACT IN AEPB: 1.0000 | INHALATION_SPRAY | Freq: Four times a day (QID) | RESPIRATORY_TRACT | 1 refills | Status: DC | PRN

## 2018-03-27 NOTE — Progress Notes (Deleted)
Name: Pamela Dodson   MRN: [id number]    DOB: 27-Feb-1950   Date:03/27/2018       Progress Note  Subjective  Chief Complaint  Chief Complaint  Patient presents with  . URI    still having cough and fever    HPI  ***  PHQ2/9: Depression screen [facility] 2/9 03/20/2018 01/16/2018 07/14/2017 01/13/2017 11/02/2016  Decreased Interest 0 0 0 0 0  Down, Depressed, Hopeless 0 0 1 0 0  PHQ - 2 Score 0 0 1 0 0  Altered sleeping 0 0 3 - -  Tired, decreased energy 0 0 0 - -  Change in appetite 0 0 0 - -  Feeling bad or failure about yourself  0 0 0 - -  Trouble concentrating 0 0 0 - -  Moving slowly or fidgety/restless 0 0 0 - -  Suicidal thoughts 0 0 0 - -  PHQ-9 Score 0 0 4 - -  Difficult doing work/chores Not difficult at all Not difficult at all Not difficult at all - -   ***  PHQ reviewed. {Pos/neg/not test:140017::"Negative"}  Patient Active Problem List   Diagnosis Date Noted  . 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 Obesity (BMI 30.0-34.9) 01/16/2018  . Proteinuria 07/15/2017  . Urine test positive for microalbuminuria 07/15/2017  . Nocturia 07/14/2017  . Full code status 11/02/2016  . Preventative health care 11/02/2016  . Prediabetes 07/12/2016  . Abnormal mammogram of left breast 07/21/2015  . Medication monitoring encounter 06/23/2015  . Shingles rash 01/22/2015  . Postsurgical intestinal bypass or anastomosis status   . Hypertension goal BP (blood pressure) < 150/90 08/07/2014  . Edema extremities 08/07/2014  . Gastro-esophageal reflux disease without esophagitis 08/07/2014  . History of knee replacement 08/07/2014  . H/O malignant neoplasm of thyroid 08/07/2014  . Hot flash, menopausal 08/07/2014  . Lumbar disc disease with radiculopathy 08/07/2014  . Dysmetabolic syndrome 69/48/5462  . Hypothyroidism, postop 08/07/2014  . Generalized OA 08/07/2014  . Hypercholesterolemia without hypertriglyceridemia 08/07/2014  . Hernia of anterior abdominal wall 08/07/2014  . Vestibular  neuronitis 08/07/2014  . Cervical cancer screening 08/07/2014  . Arthritis of hip 12/16/2010    Past Medical History:  Diagnosis Date  . Arthritis   . Blood transfusion   . Cancer (HCC)    hx thyroid cancer  . Full code status 11/02/2016  . GERD (gastroesophageal reflux disease)   . 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 Marlou Starks, II in [place]  . Hypertension   . Hypothyroidism   . Neuromuscular disorder (HCC)    numbnes lower legs s/p knee replacements  . PONV (postoperative nausea and vomiting)    usually needs zofran/antiemetic prior to surgery  . Thyroid cancer ([place])   . Wears contact lenses     Past Surgical History:  Procedure Laterality Date  . APPENDECTOMY    . CARPAL TUNNEL RELEASE Left 2016  . CESAREAN SECTION    . CHOLECYSTECTOMY    . COLON SURGERY     diverticulitic mass removed  . COLONOSCOPY WITH PROPOFOL N/A 08/15/2014   Procedure: COLONOSCOPY WITH PROPOFOL;  Surgeon: Lucilla Lame, MD;  Location: [place];  Service: Endoscopy;  Laterality: N/A;  . GANGLION CYST EXCISION     L wrist  . GLOMUS TUMOR EXCISION Left 12/12/2017  . JOINT REPLACEMENT    . knee arthroscopy    . THYROIDECTOMY  2009  . TOTAL HIP ARTHROPLASTY  12/16/2010   Procedure: TOTAL HIP ARTHROPLASTY;  Surgeon: Kerin Salen;  Location: [place];  Service: Orthopedics;  Laterality: Left;  Depuy  . TOTAL KNEE ARTHROPLASTY  2009    Social History   Tobacco Use  . Smoking status: Never Smoker  . Smokeless tobacco: Never Used  Substance Use Topics  . Alcohol use: Yes    Alcohol/week: 1.0 standard drinks    Types: 1 Glasses of wine per week    Comment: occasional     Current Outpatient Medications:  .  amLODipine-benazepril (LOTREL) 10-40 MG capsule, Take 1 capsule by mouth daily., Disp: , Rfl:  .  Ascorbic Acid (VITAMIN C) 100 MG tablet, Take 100 mg by mouth daily., Disp: , Rfl:  .  aspirin EC 81 MG tablet, Take 1 tablet (81 mg total) by mouth daily., Disp: , Rfl:  .   atorvastatin (LIPITOR) 80 MG tablet, Take 1 tablet (80 mg total) by mouth daily., Disp: 90 tablet, Rfl: 3 .  benzonatate (TESSALON) 200 MG capsule, Take 1 capsule (200 mg total) by mouth 3 (three) times daily as needed for cough., Disp: 30 capsule, Rfl: 0 .  Black Cohosh (REMIFEMIN PO), Take 1 tablet by mouth 2 (two) times daily. Over the counter medication. For hot flashes. , Disp: , Rfl:  .  cetirizine (ZYRTEC) 10 MG tablet, Take 1 tablet (10 mg total) by mouth daily. (Patient not taking: Reported on 03/20/2018), Disp: 30 tablet, Rfl: 0 .  Cholecalciferol 1000 UNITS capsule, Take 1 capsule by mouth daily., Disp: , Rfl:  .  levothyroxine (SYNTHROID, LEVOTHROID) 175 MCG tablet, TAKE 1 TABLET BY MOUTH  DAILY BEFORE BREAKFAST, Disp: 90 tablet, Rfl: 0 .  meloxicam (MOBIC) 15 MG tablet, Take 15 mg by mouth daily as needed for pain., Disp: , Rfl:  .  Multiple Vitamin (MULTIVITAMIN) tablet, Take 1 tablet by mouth daily., Disp: , Rfl:  .  Omega-3 Fatty Acids (FISH OIL) 1000 MG CPDR, Take 1 capsule by mouth 2 (two) times daily., Disp: , Rfl:  .  venlafaxine XR (EFFEXOR-XR) 37.5 MG 24 hr capsule, Take 1 capsule (37.5 mg total) by mouth daily with breakfast., Disp: 90 capsule, Rfl: 3  No Known Allergies  ROS  ***  No other specific complaints in a complete review of systems (except as listed in HPI above).  Objective  Vitals:   03/27/18 1332  BP: 116/60  Pulse: 78  Resp: 14  Temp: 98.9 F (37.2 C)  TempSrc: Oral  SpO2: 99%  Weight: 190 lb 9.6 oz (86.5 kg)  Height: 5\' 3"  (1.6 m)   ***  Body mass index is 33.76 kg/m.  Nursing Note and Vital Signs reviewed.  Physical Exam  ***   No results found for this or any previous visit (from the past 48 hour(s)).  Assessment & Plan  There are no diagnoses linked to this encounter.   -Red flags and when to present for emergency care or RTC including fever >101.55F, chest pain, shortness of breath, new/worsening/un-resolving symptoms, ***  reviewed with patient at time of visit. Follow up and care instructions discussed and provided in AVS. -Reviewed Health Maintenance: ***

## 2018-03-27 NOTE — Patient Instructions (Signed)
Please take antibiotics as prescribed. Take azithromycin with lunch and augmentin in the morning and evening with some food on your stomach.  Please take acetaminophen 1,000mg  every 8 hours and additional 400-800 mg of ibuprofen every 8 hours to keep fevers down as needed.  Please continue mucinex and use albuterol inhaler for wheezing 1 puff at night to help you cough up secretion and allow you to get some rest Please be aware cough syrup can make you drowsy, use it sparingly as needed.   - If fevers are not controlled, you develop shortness of breath or any new or concerning symptoms please let us know.

## 2018-03-27 NOTE — Progress Notes (Signed)
Name: Pamela Dodson   MRN: [id number]    DOB: [birth year]   Date:03/27/2018       Progress Note  Subjective  Chief Complaint  Chief Complaint  Patient presents with  . URI    still having cough and fever    HPI Patient endorses fever unimproved with tylenol, congestion and very productive cough.  Patient is using digital oral thermometer states several nights ago temperature was 103F, last few days it has been 101F. States cough is unimproved with tessalon perls, mild relief with robitussin honey. Mild sore throat.   Denies shortness of breath, chest pain, diarrhea, nausea, vomiting.    PHQ2/9: Depression screen [facility] 2/9 03/20/2018 01/16/2018 07/14/2017 01/13/2017 11/02/2016  Decreased Interest 0 0 0 0 0  Down, Depressed, Hopeless 0 0 1 0 0  PHQ - 2 Score 0 0 1 0 0  Altered sleeping 0 0 3 - -  Tired, decreased energy 0 0 0 - -  Change in appetite 0 0 0 - -  Feeling bad or failure about yourself  0 0 0 - -  Trouble concentrating 0 0 0 - -  Moving slowly or fidgety/restless 0 0 0 - -  Suicidal thoughts 0 0 0 - -  PHQ-9 Score 0 0 4 - -  Difficult doing work/chores Not difficult at all Not difficult at all Not difficult at all - -    PHQ reviewed. Negative  Patient Active Problem List   Diagnosis Date Noted  . 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 Obesity (BMI 30.0-34.9) 01/16/2018  . Proteinuria 07/15/2017  . Urine test positive for microalbuminuria 07/15/2017  . Nocturia 07/14/2017  . Full code status 11/02/2016  . Preventative health care 11/02/2016  . Prediabetes 07/12/2016  . Abnormal mammogram of left breast 07/21/2015  . Medication monitoring encounter 06/23/2015  . Shingles rash 01/22/2015  . Postsurgical intestinal bypass or anastomosis status   . Hypertension goal BP (blood pressure) < 150/90 08/07/2014  . Edema extremities 08/07/2014  . Gastro-esophageal reflux disease without esophagitis 08/07/2014  . History of knee replacement 08/07/2014  . H/O malignant neoplasm of  thyroid 08/07/2014  . Hot flash, menopausal 08/07/2014  . Lumbar disc disease with radiculopathy 08/07/2014  . Dysmetabolic syndrome 00/93/8182  . Hypothyroidism, postop 08/07/2014  . Generalized OA 08/07/2014  . Hypercholesterolemia without hypertriglyceridemia 08/07/2014  . Hernia of anterior abdominal wall 08/07/2014  . Vestibular neuronitis 08/07/2014  . Cervical cancer screening 08/07/2014  . Arthritis of hip 12/16/2010    Past Medical History:  Diagnosis Date  . Arthritis   . Blood transfusion   . Cancer (HCC)    hx thyroid cancer  . Full code status 11/02/2016  . GERD (gastroesophageal reflux disease)   . 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 Marlou Starks, II in [place]  . Hypertension   . Hypothyroidism   . Neuromuscular disorder (HCC)    numbnes lower legs s/p knee replacements  . PONV (postoperative nausea and vomiting)    usually needs zofran/antiemetic prior to surgery  . Thyroid cancer ([place])   . Wears contact lenses     Past Surgical History:  Procedure Laterality Date  . APPENDECTOMY    . CARPAL TUNNEL RELEASE Left 2016  . CESAREAN SECTION    . CHOLECYSTECTOMY    . COLON SURGERY     diverticulitic mass removed  . COLONOSCOPY WITH PROPOFOL N/A 08/15/2014   Procedure: COLONOSCOPY WITH PROPOFOL;  Surgeon: Lucilla Lame, MD;  Location: [place];  Service: Endoscopy;  Laterality: N/A;  . GANGLION CYST EXCISION     L wrist  . GLOMUS TUMOR EXCISION Left 12/12/2017  . JOINT REPLACEMENT    . knee arthroscopy    . THYROIDECTOMY  2009  . TOTAL HIP ARTHROPLASTY  12/16/2010   Procedure: TOTAL HIP ARTHROPLASTY;  Surgeon: Kerin Salen;  Location: [place];  Service: Orthopedics;  Laterality: Left;  Depuy  . TOTAL KNEE ARTHROPLASTY  2009    Social History   Tobacco Use  . Smoking status: Never Smoker  . Smokeless tobacco: Never Used  Substance Use Topics  . Alcohol use: Yes    Alcohol/week: 1.0 standard drinks    Types: 1 Glasses of wine per week     Comment: occasional     Current Outpatient Medications:  .  Albuterol Sulfate 108 (90 Base) MCG/ACT AEPB, Inhale 1-2 puffs into the lungs every 6 (six) hours as needed (for wheezing, shortness of breath or chest tightness)., Disp: 1 each, Rfl: 1 .  amLODipine-benazepril (LOTREL) 10-40 MG capsule, Take 1 capsule by mouth daily., Disp: , Rfl:  .  amoxicillin-clavulanate (AUGMENTIN) 875-125 MG tablet, Take 1 tablet by mouth 2 (two) times daily., Disp: 20 tablet, Rfl: 0 .  Ascorbic Acid (VITAMIN C) 100 MG tablet, Take 100 mg by mouth daily., Disp: , Rfl:  .  aspirin EC 81 MG tablet, Take 1 tablet (81 mg total) by mouth daily., Disp: , Rfl:  .  atorvastatin (LIPITOR) 80 MG tablet, Take 1 tablet (80 mg total) by mouth daily., Disp: 90 tablet, Rfl: 3 .  azithromycin (ZITHROMAX) 250 MG tablet, 2 tablets today with lunch and 1 tablet with lunch the next 4 days, Disp: 6 tablet, Rfl: 0 .  benzonatate (TESSALON) 200 MG capsule, Take 1 capsule (200 mg total) by mouth 3 (three) times daily as needed for cough., Disp: 30 capsule, Rfl: 0 .  Black Cohosh (REMIFEMIN PO), Take 1 tablet by mouth 2 (two) times daily. Over the counter medication. For hot flashes. , Disp: , Rfl:  .  cetirizine (ZYRTEC) 10 MG tablet, Take 1 tablet (10 mg total) by mouth daily. (Patient not taking: Reported on 03/20/2018), Disp: 30 tablet, Rfl: 0 .  chlorpheniramine-HYDROcodone (TUSSIONEX PENNKINETIC ER) 10-8 MG/5ML SUER, Take 5 mLs by mouth every 12 (twelve) hours as needed for cough., Disp: 115 mL, Rfl: 0 .  Cholecalciferol 1000 UNITS capsule, Take 1 capsule by mouth daily., Disp: , Rfl:  .  levothyroxine (SYNTHROID, LEVOTHROID) 175 MCG tablet, TAKE 1 TABLET BY MOUTH  DAILY BEFORE BREAKFAST, Disp: 90 tablet, Rfl: 0 .  meloxicam (MOBIC) 15 MG tablet, Take 15 mg by mouth daily as needed for pain., Disp: , Rfl:  .  Multiple Vitamin (MULTIVITAMIN) tablet, Take 1 tablet by mouth daily., Disp: , Rfl:  .  Omega-3 Fatty Acids (FISH OIL) 1000 MG  CPDR, Take 1 capsule by mouth 2 (two) times daily., Disp: , Rfl:  .  venlafaxine XR (EFFEXOR-XR) 37.5 MG 24 hr capsule, Take 1 capsule (37.5 mg total) by mouth daily with breakfast., Disp: 90 capsule, Rfl: 3  No Known Allergies  ROS    No other specific complaints in a complete review of systems (except as listed in HPI above).  Objective  Vitals:   03/27/18 1332  BP: 116/60  Pulse: 78  Resp: 14  Temp: 98.9 F (37.2 C)  TempSrc: Oral  SpO2: 99%  Weight: 190 lb 9.6 oz (86.5 kg)  Height: 5\' 3"  (1.6 m)     Body  mass index is 33.76 kg/m.  Nursing Note and Vital Signs reviewed.  Physical Exam HENT:     Head: Normocephalic and atraumatic.     Right Ear: Hearing normal.     Left Ear: Hearing normal.     Nose: Nose normal.     Right Sinus: No maxillary sinus tenderness or frontal sinus tenderness.     Left Sinus: No maxillary sinus tenderness or frontal sinus tenderness.     Mouth/Throat:     Mouth: Mucous membranes are moist.     Pharynx: Uvula midline. No oropharyngeal exudate or posterior oropharyngeal erythema.  Eyes:     General:        Right eye: No discharge.        Left eye: No discharge.     Conjunctiva/sclera: Conjunctivae normal.  Neck:     Musculoskeletal: Normal range of motion.  Cardiovascular:     Rate and Rhythm: Normal rate.  Pulmonary:     Effort: Pulmonary effort is normal.     Breath sounds: Wheezing (bilaterally throughout, worse on the left, some improvement with coughing) and rales present.  Lymphadenopathy:     Cervical: Cervical adenopathy present.  Skin:    General: Skin is warm and dry.     Findings: No rash.  Neurological:     General: No focal deficit present.     Mental Status: She is alert and oriented to person, place, and time.  Psychiatric:        Judgment: Judgment normal.        No results found for this or any previous visit (from the past 48 hour(s)).  Assessment & Plan  1. Cough - amoxicillin-clavulanate  (AUGMENTIN) 875-125 MG tablet; Take 1 tablet by mouth 2 (two) times daily.  Dispense: 20 tablet; Refill: 0 - azithromycin (ZITHROMAX) 250 MG tablet; 2 tablets today with lunch and 1 tablet with lunch the next 4 days  Dispense: 6 tablet; Refill: 0 - Albuterol Sulfate 108 (90 Base) MCG/ACT AEPB; Inhale 1-2 puffs into the lungs every 6 (six) hours as needed (for wheezing, shortness of breath or chest tightness).  Dispense: 1 each; Refill: 1 - chlorpheniramine-HYDROcodone (TUSSIONEX PENNKINETIC ER) 10-8 MG/5ML SUER; Take 5 mLs by mouth every 12 (twelve) hours as needed for cough.  Dispense: 115 mL; Refill: 0  2. Adventitious breath sounds - amoxicillin-clavulanate (AUGMENTIN) 875-125 MG tablet; Take 1 tablet by mouth 2 (two) times daily.  Dispense: 20 tablet; Refill: 0 - azithromycin (ZITHROMAX) 250 MG tablet; 2 tablets today with lunch and 1 tablet with lunch the next 4 days  Dispense: 6 tablet; Refill: 0 - Albuterol Sulfate 108 (90 Base) MCG/ACT AEPB; Inhale 1-2 puffs into the lungs every 6 (six) hours as needed (for wheezing, shortness of breath or chest tightness).  Dispense: 1 each; Refill: 1  Due to pandemic will not order chest xray and treat presumptively for pneumonia   -Red flags and when to present for emergency care or RTC including fever >101.67F, chest pain, shortness of breath, new/worsening/un-resolving symptoms,  reviewed with patient at time of visit. Follow up and care instructions discussed and provided in AVS.

## 2018-03-28 ENCOUNTER — Telehealth: Payer: Self-pay

## 2018-03-28 NOTE — Telephone Encounter (Signed)
Copied from [place] [id number]. Topic: General - Other >> Mar 28, 2018  3:35 PM Reyne Dumas L wrote: Reason for CRM:   Pt left VM in [place].  States she was told to call back with a report of how she is feeling today.  Pt states that she is doing better after being seen.

## 2018-03-30 ENCOUNTER — Ambulatory Visit: Payer: Self-pay

## 2018-03-30 NOTE — Telephone Encounter (Signed)
Patient has improving shortness of breath and cough. Low grade fevers intermittently- appear to be improving. States overall feels much better.  Reassurance provided. Self-quarantine still recommended. Red flags discussed. Patient was appreciative.

## 2018-03-30 NOTE — Telephone Encounter (Signed)
Rec'd call from pt.  Reported she started Amoxicillin and Z-pak on 3/23 for Pneumonia.  Reported her cough remains with a "lighter" brown mucus production, and low grade fever; 99.7 today; has been as high as 100.0 degrees yesterday.  Denied shortness of breath.  Has not noticed any wheezing, since starting the antibiotic.  Denied feeling any chest tightness.  Stated she "feels like she has a head cold."  Has "clear" nasal drainage.  Verbalized that she just wants reassurance that she is doing everything necessary, due to the Coronavirus outbreak.  Reassured pt. that continuing to take antibiotics for pneumonia, and treating other symptoms of cough, fever, and head cold is the best thing to do at this time.  Advised to continue self care with rest, fluids, humidifier, and Tylenol for fever/ body aches.  Advised will make provider aware of current symptoms and concerns, to be sure all the appropriate treatment is in place. Pt. Verb. Understanding; agreed with plan.      Reason for Disposition . [1] Taking antibiotic > 48 hours (2 days) for pneumonia AND [2] fever persists or recurs  Answer Assessment - Initial Assessment Questions 1. SYMPTOMS: "What symptoms are you most concerned about?" "Is it better, the same, or worse compared to when you saw the doctor?"     Continued low grade temperature 99.7 - 100 degrees since starting the antibiotic on 3/23.  2. BREATHING DIFFICULTY: "Are you having any difficulty breathing?" If so, ask "How bad is it?"  (e.g., none, mild, moderate, severe)   - MILD: No SOB at rest, mild SOB with walking, speaks normally in sentences, can lay down, no retractions, pulse < 100.    - MODERATE: SOB at rest, SOB with minimal exertion and prefers to sit, cannot lie down flat, speaks in phrases, mild retractions, audible wheezing, pulse 100-120.    - SEVERE: Very SOB at rest, speaks in single words, struggling to breathe, sitting hunched forward, retractions, pulse > 120      Denied   3. FEVER: "Do you have a fever?" If so, ask: "What is your temperature, how was it measured, and when did it start?"     99.7 at 12:30 PM 4. SPUTUM: "Describe the color of your sputum" (clear, white, yellow, green, blood-tinged)     Light brown mucus 5. DIAGNOSIS CONFIRMATION: "When was the pneumonia diagnosed?" "By whom?"     Yes  6. ANTIBIOTIC: "Are you taking an antibiotic?"  If so, "Which one?" "When was it started?"     Augmentin, Zithromax 7. OTHER TREATMENT: "Are you receiving any other treatment for the pneumonia?" (e.g., albuterol nebulizer, oxygen) If so, ask, "How often?" and "Do they help?"     Albuterol Inhaler prn, Hydrocodone cough syrup, Antibiotics,  8. HOSPITAL ADMISSION: "Were you hospitalized for this pneumonia?" If so, ask "When were you discharged home from the hospital?"     No  Protocols used: PNEUMONIA ON ANTIBIOTIC FOLLOW-UP CALL-A-AH

## 2018-04-12 DIAGNOSIS — I10 Essential (primary) hypertension: Secondary | ICD-10-CM | POA: Diagnosis not present

## 2018-04-12 DIAGNOSIS — R809 Proteinuria, unspecified: Secondary | ICD-10-CM | POA: Diagnosis not present

## 2018-04-12 DIAGNOSIS — R600 Localized edema: Secondary | ICD-10-CM | POA: Diagnosis not present

## 2018-04-12 DIAGNOSIS — N182 Chronic kidney disease, stage 2 (mild): Secondary | ICD-10-CM | POA: Diagnosis not present

## 2018-04-24 ENCOUNTER — Other Ambulatory Visit: Payer: Self-pay | Admitting: Family Medicine

## 2018-04-24 ENCOUNTER — Other Ambulatory Visit: Payer: Self-pay | Admitting: Nurse Practitioner

## 2018-04-25 NOTE — Telephone Encounter (Signed)
Lab Results  Component Value Date   ALT 22 01/16/2018   Lab Results  Component Value Date   CHOL 136 01/16/2018   HDL 39 (L) 01/16/2018   LDLCALC 74 01/16/2018   TRIG 145 01/16/2018   CHOLHDL 3.5 01/16/2018

## 2018-05-23 ENCOUNTER — Telehealth: Payer: Self-pay

## 2018-05-23 NOTE — Telephone Encounter (Signed)
Copied from [place] [phone]. Topic: Appointment Scheduling - Scheduling Inquiry for Clinic >> May 22, 2018  1:17 PM Celene Kras A wrote: Reason for CRM: Pt called requesting to set up labs to get her thyroid checked. Please advise

## 2018-05-23 NOTE — Telephone Encounter (Signed)
Lab order is placed by Dr. Sanda Klein from last visit. Can come in for labs only visit. Please schedule within next month for routine follow up 1 week after she comes in for labs.

## 2018-05-30 ENCOUNTER — Other Ambulatory Visit: Payer: Self-pay

## 2018-05-30 DIAGNOSIS — E890 Postprocedural hypothyroidism: Secondary | ICD-10-CM | POA: Diagnosis not present

## 2018-05-31 LAB — TSH: TSH: 0.76 mIU/L (ref 0.40–4.50)

## 2018-06-06 ENCOUNTER — Ambulatory Visit: Payer: Medicare Other | Admitting: Nurse Practitioner

## 2018-06-19 ENCOUNTER — Other Ambulatory Visit: Payer: Self-pay | Admitting: Family Medicine

## 2018-06-19 NOTE — Telephone Encounter (Signed)
Please schedule patient for follow up in the next 30 days.  

## 2018-07-24 ENCOUNTER — Ambulatory Visit (INDEPENDENT_AMBULATORY_CARE_PROVIDER_SITE_OTHER): Payer: Medicare Other | Admitting: Nurse Practitioner

## 2018-07-24 ENCOUNTER — Other Ambulatory Visit: Payer: Self-pay

## 2018-07-24 ENCOUNTER — Encounter: Payer: Self-pay | Admitting: Nurse Practitioner

## 2018-07-24 VITALS — Ht 63.0 in | Wt 186.0 lb

## 2018-07-24 DIAGNOSIS — R232 Flushing: Secondary | ICD-10-CM

## 2018-07-24 DIAGNOSIS — M5116 Intervertebral disc disorders with radiculopathy, lumbar region: Secondary | ICD-10-CM | POA: Diagnosis not present

## 2018-07-24 DIAGNOSIS — K219 Gastro-esophageal reflux disease without esophagitis: Secondary | ICD-10-CM | POA: Diagnosis not present

## 2018-07-24 DIAGNOSIS — E890 Postprocedural hypothyroidism: Secondary | ICD-10-CM

## 2018-07-24 DIAGNOSIS — I10 Essential (primary) hypertension: Secondary | ICD-10-CM | POA: Diagnosis not present

## 2018-07-24 DIAGNOSIS — E7800 Pure hypercholesterolemia, unspecified: Secondary | ICD-10-CM

## 2018-07-24 MED ORDER — VENLAFAXINE HCL ER 37.5 MG PO CP24: 37.5000 mg | ORAL_CAPSULE | Freq: Every day | ORAL | 3 refills | Status: DC

## 2018-07-24 MED ORDER — ATORVASTATIN CALCIUM 80 MG PO TABS: 80.0000 mg | ORAL_TABLET | Freq: Every day | ORAL | 3 refills | Status: DC

## 2018-07-24 MED ORDER — LEVOTHYROXINE SODIUM 175 MCG PO TABS: 175.0000 ug | ORAL_TABLET | Freq: Every day | ORAL | 3 refills | Status: DC

## 2018-07-24 NOTE — Progress Notes (Signed)
Virtual Visit via Video Note  I connected with Pamela Dodson on 07/24/18 at 11:20 AM EDT by a video enabled telemedicine application and verified that I am speaking with the correct person using two identifiers.   Staff discussed the limitations of evaluation and management by telemedicine and the availability of in person appointments. The patient expressed understanding and agreed to proceed.  Patient location: home  My location: work office Other people present: none HPI  Hypertension Patient is on amlodipine 10mg  and benazepril 40mg  daily.  Takes medications as prescribed with no missed doses a month.  She is relatively compliant with low-salt diet.  She is not checking. Denies chest pain, headaches, blurry vision. BP Readings from Last 3 Encounters:  03/27/18 116/60  03/20/18 118/60  01/16/18 118/68    Hyperlipidemia Patient rx atorvastatin 80mg  daily and fishoils Takes medications as prescribed with no missed doses a month.  Diet:vegetables daily, red meats a few times a week- more chicken and fish Denies myalgias Lab Results  Component Value Date   CHOL 136 01/16/2018   HDL 39 (L) 01/16/2018   LDLCALC 74 01/16/2018   TRIG 145 01/16/2018   CHOLHDL 3.5 01/16/2018   Prediabetes Has been working on weight loss, is moving around more and has been monitoring portions.  Wt Readings from Last 3 Encounters:  07/24/18 186 lb (84.4 kg)  03/27/18 190 lb 9.6 oz (86.5 kg)  03/20/18 188 lb (85.3 kg)   Lab Results  Component Value Date   HGBA1C 5.6 01/16/2018   Hypothyroidism Patient rx levothryoxine 175 mcg daily. Has been on this dose for 1 year. Patient denies fatigue/palpitations, insomnia, constipation/diarrhea, hot/cold intolerances,   Lab Results  Component Value Date   TSH 0.76 05/30/2018   Takes PRN acetaminophen for chronic  lower back and hip pain. Stopped NSAIDs and kidney function improved.   PHQ2/9: Depression screen [facility] 2/9 07/24/2018 03/20/2018 01/16/2018  07/14/2017 01/13/2017  Decreased Interest 0 0 0 0 0  Down, Depressed, Hopeless 0 0 0 1 0  PHQ - 2 Score 0 0 0 1 0  Altered sleeping 0 0 0 3 -  Tired, decreased energy 0 0 0 0 -  Change in appetite 0 0 0 0 -  Feeling bad or failure about yourself  0 0 0 0 -  Trouble concentrating 0 0 0 0 -  Moving slowly or fidgety/restless 0 0 0 0 -  Suicidal thoughts 0 0 0 0 -  PHQ-9 Score 0 0 0 4 -  Difficult doing work/chores Not difficult at all Not difficult at all Not difficult at all Not difficult at all -     PHQ reviewed. Negative  Patient Active Problem List   Diagnosis Date Noted  . 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 Obesity (BMI 30.0-34.9) 01/16/2018  . Proteinuria 07/15/2017  . Urine test positive for microalbuminuria 07/15/2017  . Nocturia 07/14/2017  . Full code status 11/02/2016  . Preventative health care 11/02/2016  . Prediabetes 07/12/2016  . Abnormal mammogram of left breast 07/21/2015  . Medication monitoring encounter 06/23/2015  . Shingles rash 01/22/2015  . Postsurgical intestinal bypass or anastomosis status   . Hypertension goal BP (blood pressure) < 150/90 08/07/2014  . Edema extremities 08/07/2014  . Gastro-esophageal reflux disease without esophagitis 08/07/2014  . History of knee replacement 08/07/2014  . H/O malignant neoplasm of thyroid 08/07/2014  . Hot flash, menopausal 08/07/2014  . Lumbar disc disease with radiculopathy 08/07/2014  . Dysmetabolic syndrome 02/72/5366  . Hypothyroidism, postop 08/07/2014  .  Generalized OA 08/07/2014  . Hypercholesterolemia without hypertriglyceridemia 08/07/2014  . Hernia of anterior abdominal wall 08/07/2014  . Vestibular neuronitis 08/07/2014  . Cervical cancer screening 08/07/2014  . Arthritis of hip 12/16/2010    Past Medical History:  Diagnosis Date  . Arthritis   . Blood transfusion   . Cancer (HCC)    hx thyroid cancer  . Full code status 11/02/2016  . GERD (gastroesophageal reflux disease)   . Glomus tumor  01/16/2018   Dr. Marlou Starks, II in [place]  . Hypertension   . Hypothyroidism   . Neuromuscular disorder (HCC)    numbnes lower legs s/p knee replacements  . PONV (postoperative nausea and vomiting)    usually needs zofran/antiemetic prior to surgery  . Thyroid cancer ([place])   . Wears contact lenses     Past Surgical History:  Procedure Laterality Date  . APPENDECTOMY    . CARPAL TUNNEL RELEASE Left 2016  . CESAREAN SECTION    . CHOLECYSTECTOMY    . COLON SURGERY     diverticulitic mass removed  . COLONOSCOPY WITH PROPOFOL N/A 08/15/2014   Procedure: COLONOSCOPY WITH PROPOFOL;  Surgeon: Lucilla Lame, MD;  Location: [place];  Service: Endoscopy;  Laterality: N/A;  . GANGLION CYST EXCISION     L wrist  . GLOMUS TUMOR EXCISION Left 12/12/2017  . JOINT REPLACEMENT    . knee arthroscopy    . THYROIDECTOMY  2009  . TOTAL HIP ARTHROPLASTY  12/16/2010   Procedure: TOTAL HIP ARTHROPLASTY;  Surgeon: Kerin Salen;  Location: [place];  Service: Orthopedics;  Laterality: Left;  Depuy  . TOTAL KNEE ARTHROPLASTY  2009    Social History   Tobacco Use  . Smoking status: Never Smoker  . Smokeless tobacco: Never Used  Substance Use Topics  . Alcohol use: Yes    Alcohol/week: 1.0 standard drinks    Types: 1 Glasses of wine per week    Comment: occasional     Current Outpatient Medications:  .  amLODipine-benazepril (LOTREL) 10-40 MG capsule, Take 1 capsule by mouth daily., Disp: , Rfl:  .  Ascorbic Acid (VITAMIN C) 100 MG tablet, Take 100 mg by mouth daily., Disp: , Rfl:  .  atorvastatin (LIPITOR) 80 MG tablet, TAKE 1 TABLET BY MOUTH  DAILY, Disp: 90 tablet, Rfl: 1 .  Black Cohosh (REMIFEMIN PO), Take 1 tablet by mouth 2 (two) times daily. Over the counter medication. For hot flashes. , Disp: , Rfl:  .  cetirizine (ZYRTEC) 10 MG tablet, Take 1 tablet (10 mg total) by mouth daily., Disp: 30 tablet, Rfl: 0 .  Cholecalciferol 1000 UNITS capsule, Take 1 capsule by mouth daily.,  Disp: , Rfl:  .  levothyroxine (SYNTHROID) 175 MCG tablet, TAKE 1 TABLET BY MOUTH  DAILY BEFORE BREAKFAST, Disp: 90 tablet, Rfl: 0 .  Multiple Vitamin (MULTIVITAMIN) tablet, Take 1 tablet by mouth daily., Disp: , Rfl:  .  Omega-3 Fatty Acids (FISH OIL) 1000 MG CPDR, Take 1 capsule by mouth 2 (two) times daily., Disp: , Rfl:  .  venlafaxine XR (EFFEXOR-XR) 37.5 MG 24 hr capsule, Take 1 capsule (37.5 mg total) by mouth daily with breakfast., Disp: 90 capsule, Rfl: 3 .  albuterol (VENTOLIN HFA) 108 (90 Base) MCG/ACT inhaler, INHALE 1- 2 PUFFS INTO THE LUNGS EVERY 6 HOURS AS NEEDED FOR WHEEZING OR SHORTNESS OF BREATH OR CHEST TIGHTNESS (Patient not taking: Reported on 07/24/2018), Disp: 25.5 g, Rfl: 0 .  amoxicillin-clavulanate (AUGMENTIN) 875-125 MG tablet, Take  1 tablet by mouth 2 (two) times daily. (Patient not taking: Reported on 07/24/2018), Disp: 20 tablet, Rfl: 0 .  aspirin EC 81 MG tablet, Take 1 tablet (81 mg total) by mouth daily., Disp: , Rfl:  .  azithromycin (ZITHROMAX) 250 MG tablet, 2 tablets today with lunch and 1 tablet with lunch the next 4 days (Patient not taking: Reported on 07/24/2018), Disp: 6 tablet, Rfl: 0 .  benzonatate (TESSALON) 200 MG capsule, Take 1 capsule (200 mg total) by mouth 3 (three) times daily as needed for cough. (Patient not taking: Reported on 07/24/2018), Disp: 30 capsule, Rfl: 0 .  chlorpheniramine-HYDROcodone (TUSSIONEX PENNKINETIC ER) 10-8 MG/5ML SUER, Take 5 mLs by mouth every 12 (twelve) hours as needed for cough. (Patient not taking: Reported on 07/24/2018), Disp: 115 mL, Rfl: 0 .  meloxicam (MOBIC) 15 MG tablet, Take 15 mg by mouth daily as needed for pain., Disp: , Rfl:   No Known Allergies  ROS   No other specific complaints in a complete review of systems (except as listed in HPI above).  Objective  Vitals:   07/24/18 1104  Weight: 186 lb (84.4 kg)  Height: 5\' 3"  (1.6 m)    Body mass index is 32.95 kg/m.  Nursing Note and Vital Signs  reviewed.  Physical Exam  Constitutional: Patient appears well-developed and well-nourished. No distress.  HENT: Head: Normocephalic and atraumatic. Pulmonary/Chest: Effort normal ,  Neurological: alert and oriented, speech normal.  Psychiatric: Patient has a normal mood and affect. behavior is normal. Judgment and thought content normal.    Assessment & Plan  1. Hypertension goal BP (blood pressure) < 150/90 Stable continue meds, routine follow-up with nephrology   2. Hot flashes - venlafaxine XR (EFFEXOR-XR) 37.5 MG 24 hr capsule; Take 1 capsule (37.5 mg total) by mouth daily with breakfast.  Dispense: 90 capsule; Refill: 3  3. Gastro-esophageal reflux disease without esophagitis Diet controlled.   4. Hypothyroidism, postop - levothyroxine (SYNTHROID) 175 MCG tablet; Take 1 tablet (175 mcg total) by mouth daily before breakfast.  Dispense: 90 tablet; Refill: 3  5. Lumbar disc disease with radiculopathy PRN acetaminophen, follows with ortho   6. Hypercholesterolemia without hypertriglyceridemia - atorvastatin (LIPITOR) 80 MG tablet; Take 1 tablet (80 mg total) by mouth daily.  Dispense: 90 tablet; Refill: 3   Follow Up Instructions:    I discussed the assessment and treatment plan with the patient. The patient was provided an opportunity to ask questions and all were answered. The patient agreed with the plan and demonstrated an understanding of the instructions.   The patient was advised to call back or seek an in-person evaluation if the symptoms worsen or if the condition fails to improve as anticipated.  I provided 16 minutes of non-face-to-face time during this encounter.   Fredderick Severance, NP

## 2018-08-10 DIAGNOSIS — G5602 Carpal tunnel syndrome, left upper limb: Secondary | ICD-10-CM | POA: Diagnosis not present

## 2018-08-10 DIAGNOSIS — M65311 Trigger thumb, right thumb: Secondary | ICD-10-CM | POA: Diagnosis not present

## 2018-08-10 DIAGNOSIS — M65351 Trigger finger, right little finger: Secondary | ICD-10-CM | POA: Diagnosis not present

## 2018-08-10 DIAGNOSIS — D2112 Benign neoplasm of connective and other soft tissue of left upper limb, including shoulder: Secondary | ICD-10-CM | POA: Diagnosis not present

## 2018-09-21 ENCOUNTER — Encounter: Payer: Self-pay | Admitting: Family Medicine

## 2018-10-10 DIAGNOSIS — N182 Chronic kidney disease, stage 2 (mild): Secondary | ICD-10-CM | POA: Insufficient documentation

## 2018-10-10 DIAGNOSIS — I10 Essential (primary) hypertension: Secondary | ICD-10-CM | POA: Insufficient documentation

## 2018-11-10 ENCOUNTER — Ambulatory Visit (INDEPENDENT_AMBULATORY_CARE_PROVIDER_SITE_OTHER): Payer: Medicare Other

## 2018-11-10 VITALS — Temp 97.5°F | Ht 63.0 in | Wt 190.0 lb

## 2018-11-10 DIAGNOSIS — Z0000 Encounter for general adult medical examination without abnormal findings: Secondary | ICD-10-CM

## 2018-11-10 DIAGNOSIS — Z1231 Encounter for screening mammogram for malignant neoplasm of breast: Secondary | ICD-10-CM | POA: Diagnosis not present

## 2018-11-10 NOTE — Patient Instructions (Signed)
[name] , Thank you for taking time to come for your Medicare Wellness Visit. I appreciate your ongoing commitment to your health goals. Please review the following plan we discussed and let me know if I can assist you in the future.   Screening recommendations/referrals: Colonoscopy: done 08/15/14 Mammogram: done 10/25/17. Please call [phone] to schedule your mammogram.  Bone Density: done 09/08/17 Recommended yearly ophthalmology/optometry visit for glaucoma screening and checkup Recommended yearly dental visit for hygiene and checkup  Vaccinations: Influenza vaccine: done 09/28/18 Pneumococcal vaccine: done 2018 Tdap vaccine: done 2011 Shingles vaccine: Shingrix discussed. Please contact your pharmacy for coverage information.   Advanced directives: Please bring a copy of your health care power of attorney and living will to the office at your convenience once you have completed those documents.   Conditions/risks identified: Recommend healthy eating and physical activity for desired weight loss.   Next appointment: Please follow up in one year for your Medicare Annual Wellness visit.     Preventive Care 48 Years and Older, Female Preventive care refers to lifestyle choices and visits with your health care provider that can promote health and wellness. What does preventive care include?  A yearly physical exam. This is also called an annual well check.  Dental exams once or twice a year.  Routine eye exams. Ask your health care provider how often you should have your eyes checked.  Personal lifestyle choices, including:  Daily care of your teeth and gums.  Regular physical activity.  Eating a healthy diet.  Avoiding tobacco and drug use.  Limiting alcohol use.  Practicing safe sex.  Taking low-dose aspirin every day.  Taking vitamin and mineral supplements as recommended by your health care provider. What happens during an annual well check? The services  and screenings done by your health care provider during your annual well check will depend on your age, overall health, lifestyle risk factors, and family history of disease. Counseling  Your health care provider may ask you questions about your:  Alcohol use.  Tobacco use.  Drug use.  Emotional well-being.  Home and relationship well-being.  Sexual activity.  Eating habits.  History of falls.  Memory and ability to understand (cognition).  Work and work Statistician.  Reproductive health. Screening  You may have the following tests or measurements:  Height, weight, and BMI.  Blood pressure.  Lipid and cholesterol levels. These may be checked every 5 years, or more frequently if you are over 55 years old.  Skin check.  Lung cancer screening. You may have this screening every year starting at age 64 if you have a 30-pack-year history of smoking and currently smoke or have quit within the past 15 years.  Fecal occult blood test (FOBT) of the stool. You may have this test every year starting at age 33.  Flexible sigmoidoscopy or colonoscopy. You may have a sigmoidoscopy every 5 years or a colonoscopy every 10 years starting at age 55.  Hepatitis C blood test.  Hepatitis B blood test.  Sexually transmitted disease (STD) testing.  Diabetes screening. This is done by checking your blood sugar (glucose) after you have not eaten for a while (fasting). You may have this done every 1-3 years.  Bone density scan. This is done to screen for osteoporosis. You may have this done starting at age 82.  Mammogram. This may be done every 1-2 years. Talk to your health care provider about how often you should have regular mammograms. Talk with your health care  provider about your test results, treatment options, and if necessary, the need for more tests. Vaccines  Your health care provider may recommend certain vaccines, such as:  Influenza vaccine. This is recommended every year.   Tetanus, diphtheria, and acellular pertussis (Tdap, Td) vaccine. You may need a Td booster every 10 years.  Zoster vaccine. You may need this after age 63.  Pneumococcal 13-valent conjugate (PCV13) vaccine. One dose is recommended after age 25.  Pneumococcal polysaccharide (PPSV23) vaccine. One dose is recommended after age 64. Talk to your health care provider about which screenings and vaccines you need and how often you need them. This information is not intended to replace advice given to you by your health care provider. Make sure you discuss any questions you have with your health care provider. Document Released: 01/17/2015 Document Revised: 09/10/2015 Document Reviewed: 10/22/2014 Elsevier Interactive Patient Education  2017 [employer] Prevention in the Home Falls can cause injuries. They can happen to people of all ages. There are many things you can do to make your home safe and to help prevent falls. What can I do on the outside of my home?  Regularly fix the edges of walkways and driveways and fix any cracks.  Remove anything that might make you trip as you walk through a door, such as a raised step or threshold.  Trim any bushes or trees on the path to your home.  Use bright outdoor lighting.  Clear any walking paths of anything that might make someone trip, such as rocks or tools.  Regularly check to see if handrails are loose or broken. Make sure that both sides of any steps have handrails.  Any raised decks and porches should have guardrails on the edges.  Have any leaves, snow, or ice cleared regularly.  Use sand or salt on walking paths during winter.  Clean up any spills in your garage right away. This includes oil or grease spills. What can I do in the bathroom?  Use night lights.  Install grab bars by the toilet and in the tub and shower. Do not use towel bars as grab bars.  Use non-skid mats or decals in the tub or shower.  If you need to  sit down in the shower, use a plastic, non-slip stool.  Keep the floor dry. Clean up any water that spills on the floor as soon as it happens.  Remove soap buildup in the tub or shower regularly.  Attach bath mats securely with double-sided non-slip rug tape.  Do not have throw rugs and other things on the floor that can make you trip. What can I do in the bedroom?  Use night lights.  Make sure that you have a light by your bed that is easy to reach.  Do not use any sheets or blankets that are too big for your bed. They should not hang down onto the floor.  Have a firm chair that has side arms. You can use this for support while you get dressed.  Do not have throw rugs and other things on the floor that can make you trip. What can I do in the kitchen?  Clean up any spills right away.  Avoid walking on wet floors.  Keep items that you use a lot in easy-to-reach places.  If you need to reach something above you, use a strong step stool that has a grab bar.  Keep electrical cords out of the way.  Do not use floor polish  or wax that makes floors slippery. If you must use wax, use non-skid floor wax.  Do not have throw rugs and other things on the floor that can make you trip. What can I do with my stairs?  Do not leave any items on the stairs.  Make sure that there are handrails on both sides of the stairs and use them. Fix handrails that are broken or loose. Make sure that handrails are as long as the stairways.  Check any carpeting to make sure that it is firmly attached to the stairs. Fix any carpet that is loose or worn.  Avoid having throw rugs at the top or bottom of the stairs. If you do have throw rugs, attach them to the floor with carpet tape.  Make sure that you have a light switch at the top of the stairs and the bottom of the stairs. If you do not have them, ask someone to add them for you. What else can I do to help prevent falls?  Wear shoes that:  Do not  have high heels.  Have rubber bottoms.  Are comfortable and fit you well.  Are closed at the toe. Do not wear sandals.  If you use a stepladder:  Make sure that it is fully opened. Do not climb a closed stepladder.  Make sure that both sides of the stepladder are locked into place.  Ask someone to hold it for you, if possible.  Clearly mark and make sure that you can see:  Any grab bars or handrails.  First and last steps.  Where the edge of each step is.  Use tools that help you move around (mobility aids) if they are needed. These include:  Canes.  Walkers.  Scooters.  Crutches.  Turn on the lights when you go into a dark area. Replace any light bulbs as soon as they burn out.  Set up your furniture so you have a clear path. Avoid moving your furniture around.  If any of your floors are uneven, fix them.  If there are any pets around you, be aware of where they are.  Review your medicines with your doctor. Some medicines can make you feel dizzy. This can increase your chance of falling. Ask your doctor what other things that you can do to help prevent falls. This information is not intended to replace advice given to you by your health care provider. Make sure you discuss any questions you have with your health care provider. Document Released: 10/17/2008 Document Revised: 05/29/2015 Document Reviewed: 01/25/2014 Elsevier Interactive Patient Education  2017 [employer].

## 2018-11-10 NOTE — Progress Notes (Signed)
Subjective:   Pamela Dodson is a 68 y.o. female who presents for Medicare Annual (Subsequent) preventive examination.  Virtual Visit via Telephone Note  I connected with Pamela Dodson on 11/10/18 at  9:20 AM EST by telehealth enabled device via video chat and verified that I am speaking with the correct person using two identifiers.  Medicare Annual Wellness visit completed telephonically due to Covid-19 pandemic.   Location: Patient: home Provider: office   I discussed the limitations, risks, security and privacy concerns of performing an evaluation and management service by telephone and the availability of in person appointments. The patient expressed understanding and agreed to proceed.  Some vital signs may be absent or patient reported.   Clemetine Marker, LPN    Review of Systems:   Cardiac Risk Factors include: advanced age (>57men, >2 women);obesity (BMI >30kg/m2);dyslipidemia;hypertension     Objective:     Vitals: Temp (!) 97.5 F (36.4 C)   Ht 5\' 3"  (1.6 m)   Wt 190 lb (86.2 kg)   BMI 33.66 kg/m   Body mass index is 33.66 kg/m.  Advanced Directives 11/10/2018 11/02/2016 07/12/2016 01/06/2016 06/23/2015 01/22/2015 08/07/2014  Does Patient Have a Medical Advance Directive? No No No No No No No  Would patient like information on creating a medical advance directive? No - Patient declined - - - No - patient declined information No - patient declined information No - patient declined information  Pre-existing out of facility DNR order (yellow form or pink MOST form) - - - - - - -    Tobacco Social History   Tobacco Use  Smoking Status Never Smoker  Smokeless Tobacco Never Used     Counseling given: Not Answered   Clinical Intake:  Pre-visit preparation completed: Yes  Pain : No/denies pain     BMI - recorded: 33.66 Nutritional Status: BMI > 30  Obese Nutritional Risks: None Diabetes: No  How often do you need to have someone help you when you read  instructions, pamphlets, or other written materials from your doctor or pharmacy?: 1 - Never  Interpreter Needed?: No  Information entered by :: Clemetine Marker LPN  Past Medical History:  Diagnosis Date  . Arthritis   . Blood transfusion   . Cancer (HCC)    hx thyroid cancer  . Full code status 11/02/2016  . GERD (gastroesophageal reflux disease)   . 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 Marlou Starks, II in [place]  . H/O malignant neoplasm of thyroid 08/07/2014   S/P resection x2   . History of knee replacement 08/07/2014  . Hypertension   . Hypothyroidism   . Neuromuscular disorder (HCC)    numbnes lower legs s/p knee replacements  . PONV (postoperative nausea and vomiting)    usually needs zofran/antiemetic prior to surgery  . Shingles rash 01/22/2015  . Thyroid cancer ([place])   . Wears contact lenses    Past Surgical History:  Procedure Laterality Date  . APPENDECTOMY    . CARPAL TUNNEL RELEASE Left 2016  . CESAREAN SECTION    . CHOLECYSTECTOMY    . COLON SURGERY     diverticulitic mass removed  . COLONOSCOPY WITH PROPOFOL N/A 08/15/2014   Procedure: COLONOSCOPY WITH PROPOFOL;  Surgeon: Lucilla Lame, MD;  Location: [place];  Service: Endoscopy;  Laterality: N/A;  . GANGLION CYST EXCISION     L wrist  . GLOMUS TUMOR EXCISION Left 12/12/2017  . JOINT REPLACEMENT    . knee arthroscopy    .  THYROIDECTOMY  2009  . TOTAL HIP ARTHROPLASTY  12/16/2010   Procedure: TOTAL HIP ARTHROPLASTY;  Surgeon: Kerin Salen;  Location: [place];  Service: Orthopedics;  Laterality: Left;  Depuy  . TOTAL KNEE ARTHROPLASTY  2009   Family History  Problem Relation Age of Onset  . Cancer Mother        lung  . Myelodysplastic syndrome Mother   . Hypothyroidism Mother   . Hypertension Brother   . Stroke Brother   . Atrial fibrillation Brother   . Birth defects Son        congenital genetic abnormality  . Dementia Maternal Grandfather   . Heart disease Paternal Grandmother        heart  disease in her 110's  . Stroke Paternal Grandmother   . Heart attack Paternal Grandfather   . Heart disease Paternal Aunt   . Heart attack Paternal Aunt   . Stroke Paternal Aunt   . Atrial fibrillation Paternal Uncle    Social History   Socioeconomic History  . Marital status: Married    Spouse name: Not on file  . Number of children: 2  . Years of education: Not on file  . Highest education level: Not on file  Occupational History  . Not on file  Social Needs  . Financial resource strain: Not hard at all  . Food insecurity    Worry: Never true    Inability: Never true  . Transportation needs    Medical: No    Non-medical: No  Tobacco Use  . Smoking status: Never Smoker  . Smokeless tobacco: Never Used  Substance and Sexual Activity  . Alcohol use: Yes    Alcohol/week: 1.0 standard drinks    Types: 1 Glasses of wine per week    Comment: occasional  . Drug use: No  . Sexual activity: Yes    Partners: Male  Lifestyle  . Physical activity    Days per week: 0 days    Minutes per session: 0 min  . Stress: Not at all  Relationships  . Social Herbalist on phone: Patient refused    Gets together: Patient refused    Attends religious service: Patient refused    Active member of club or organization: Patient refused    Attends meetings of clubs or organizations: Patient refused    Relationship status: Married  Other Topics Concern  . Not on file  Social History Narrative  . Not on file    Outpatient Encounter Medications as of 11/10/2018  Medication Sig  . amLODipine-benazepril (LOTREL) 10-40 MG capsule Take 1 capsule by mouth daily.  . Ascorbic Acid (VITAMIN C) 100 MG tablet Take 100 mg by mouth daily.  Marland Kitchen aspirin EC 81 MG tablet Take 1 tablet (81 mg total) by mouth daily.  Marland Kitchen atorvastatin (LIPITOR) 80 MG tablet Take 1 tablet (80 mg total) by mouth daily.  Renard Hamper Cohosh (REMIFEMIN PO) Take 1 tablet by mouth 2 (two) times daily. Over the counter medication.  For hot flashes.   . Cholecalciferol 1000 UNITS capsule Take 1 capsule by mouth daily.  Marland Kitchen levothyroxine (SYNTHROID) 175 MCG tablet Take 1 tablet (175 mcg total) by mouth daily before breakfast.  . Multiple Vitamin (MULTIVITAMIN) tablet Take 1 tablet by mouth daily.  . Omega-3 Fatty Acids (FISH OIL) 1000 MG CPDR Take 1 capsule by mouth 2 (two) times daily.  Marland Kitchen venlafaxine XR (EFFEXOR-XR) 37.5 MG 24 hr capsule Take 1 capsule (37.5 mg total)  by mouth daily with breakfast.  . [DISCONTINUED] cetirizine (ZYRTEC) 10 MG tablet Take 1 tablet (10 mg total) by mouth daily.   No facility-administered encounter medications on file as of 11/10/2018.     Activities of Daily Living In your present state of health, do you have any difficulty performing the following activities: 11/10/2018 07/24/2018  Hearing? N N  Comment declines hearing aids -  Vision? N N  Difficulty concentrating or making decisions? N N  Walking or climbing stairs? N N  Dressing or bathing? N N  Doing errands, shopping? N N  Preparing Food and eating ? N -  Using the Toilet? N -  In the past six months, have you accidently leaked urine? N -  Do you have problems with loss of bowel control? N -  Managing your Medications? N -  Managing your Finances? N -  Housekeeping or managing your Housekeeping? N -  Some recent data might be hidden    Patient Care Team: Hubbard Hartshorn, FNP as PCP - General (Family Medicine) Frederik Pear, MD as Consulting Physician (Orthopedic Surgery) Arelia Sneddon, [place] (Optometry) Lucilla Lame, MD as Consulting Physician (Gastroenterology) Anthonette Legato, MD as Consulting Physician (Nephrology)    Assessment:   This is a routine wellness examination for Li.  Exercise Activities and Dietary recommendations Current Exercise Habits: The patient does not participate in regular exercise at present, Exercise limited by: orthopedic condition(s)  Goals    . Increase physical activity    . Weight (lb)  < 175 lb (79.4 kg)     Pt states she would like to lose weight over the next year with healthy eating and physical activity       Fall Risk Fall Risk  11/10/2018 07/24/2018 03/20/2018 01/16/2018 10/31/2017  Falls in the past year? 1 0 0 0 No  Number falls in past yr: 1 0 0 0 -  Comment slipped around pool - - - -  Injury with Fall? 0 0 0 0 -  Risk for fall due to : History of fall(s) - - - -  Follow up Falls prevention discussed - - - -   FALL RISK PREVENTION PERTAINING TO THE HOME:  Any stairs in or around the home? Yes  If so, do they handrails? Yes   Home free of loose throw rugs in walkways, pet beds, electrical cords, etc? Yes  Adequate lighting in your home to reduce risk of falls? Yes   ASSISTIVE DEVICES UTILIZED TO PREVENT FALLS:  Life alert? No  Use of a cane, walker or w/c? No  Grab bars in the bathroom? No  Shower chair or bench in shower? Yes Elevated toilet seat or a handicapped toilet? Yes   DME ORDERS:  DME order needed?  No   TIMED UP AND GO:  Was the test performed? No . Telephonic visit.   Education: Fall risk prevention has been discussed.  Intervention(s) required? No   Depression Screen PHQ 2/9 Scores 11/10/2018 07/24/2018 03/20/2018 01/16/2018  PHQ - 2 Score 0 0 0 0  PHQ- 9 Score - 0 0 0     Cognitive Function     6CIT Screen 11/02/2016  What Year? 0 points  What month? 0 points  What time? 0 points  Count back from 20 0 points  Months in reverse 0 points  Repeat phrase 0 points  Total Score 0    Immunization History  Administered Date(s) Administered  . Influenza, High Dose Seasonal PF 09/28/2018  .  Influenza, Seasonal, Injecte, Preservative Fre 11/23/2010  . Influenza-Unspecified 09/14/2016, 09/27/2017  . Pneumococcal Conjugate-13 08/07/2014  . Pneumococcal Polysaccharide-23 01/06/2016  . Tdap 01/04/2009  . Zoster 03/02/2012    Qualifies for Shingles Vaccine? Yes  Zostavax completed 2014. Due for Shingrix. Education has been  provided regarding the importance of this vaccine. Pt has been advised to call insurance company to determine out of pocket expense. Advised may also receive vaccine at local pharmacy or Health Dept. Verbalized acceptance and understanding.  Tdap: Up to date  Flu Vaccine: Up to date  Pneumococcal Vaccine: Up to date   Screening Tests Health Maintenance  Topic Date Due  . INFLUENZA VACCINE  08/05/2018  . MAMMOGRAM  10/26/2018  . TETANUS/TDAP  01/05/2019  . DEXA SCAN  08/06/2019  . COLONOSCOPY  08/14/2024  . Hepatitis C Screening  Completed  . PNA vac Low Risk Adult  Completed    Cancer Screenings:  Colorectal Screening: Completed 08/15/14. Repeat every 10 years;   Mammogram: Completed 10/25/17. Repeat every year. Ordered today. Pt provided with contact information and advised to call to schedule appt.   Bone Density: Completed 09/08/17. Results reflect NORMAL. Repeat every 2 years.   Lung Cancer Screening: (Low Dose CT Chest recommended if Age 86-80 years, 30 pack-year currently smoking OR have quit w/in 15years.) does not qualify.   Additional Screening:  Hepatitis C Screening: does qualify; Completed 06/23/15  Vision Screening: Recommended annual ophthalmology exams for early detection of glaucoma and other disorders of the eye. Is the patient up to date with their annual eye exam?  Yes  Who is the provider or what is the name of the office in which the pt attends annual eye exams? [place] Screening: Recommended annual dental exams for proper oral hygiene  Community Resource Referral:  CRR required this visit?  No       Plan:     I have personally reviewed and addressed the Medicare Annual Wellness questionnaire and have noted the following in the patient's chart:  A. Medical and social history B. Use of alcohol, tobacco or illicit drugs  C. Current medications and supplements D. Functional ability and status E.  Nutritional status F.  Physical  activity G. Advance directives H. List of other physicians I.  Hospitalizations, surgeries, and ER visits in previous 12 months J.  [place] such as hearing and vision if needed, cognitive and depression L. Referrals and appointments   In addition, I have reviewed and discussed with patient certain preventive protocols, quality metrics, and best practice recommendations. A written personalized care plan for preventive services as well as general preventive health recommendations were provided to patient.   Signed,  Clemetine Marker, LPN Nurse Health Advisor   Nurse Notes: none

## 2019-01-05 DIAGNOSIS — K56609 Unspecified intestinal obstruction, unspecified as to partial versus complete obstruction: Secondary | ICD-10-CM

## 2019-01-05 HISTORY — DX: Unspecified intestinal obstruction, unspecified as to partial versus complete obstruction: K56.609

## 2019-01-25 ENCOUNTER — Other Ambulatory Visit: Payer: Self-pay

## 2019-01-25 ENCOUNTER — Ambulatory Visit: Payer: Medicare Other | Attending: Internal Medicine

## 2019-01-25 ENCOUNTER — Encounter: Payer: Self-pay | Admitting: Family Medicine

## 2019-01-25 ENCOUNTER — Ambulatory Visit (INDEPENDENT_AMBULATORY_CARE_PROVIDER_SITE_OTHER): Payer: Medicare Other | Admitting: Family Medicine

## 2019-01-25 VITALS — Ht 63.0 in | Wt 193.0 lb

## 2019-01-25 DIAGNOSIS — R7303 Prediabetes: Secondary | ICD-10-CM | POA: Diagnosis not present

## 2019-01-25 DIAGNOSIS — E890 Postprocedural hypothyroidism: Secondary | ICD-10-CM | POA: Diagnosis not present

## 2019-01-25 DIAGNOSIS — I10 Essential (primary) hypertension: Secondary | ICD-10-CM | POA: Diagnosis not present

## 2019-01-25 DIAGNOSIS — Z23 Encounter for immunization: Secondary | ICD-10-CM | POA: Insufficient documentation

## 2019-01-25 DIAGNOSIS — N182 Chronic kidney disease, stage 2 (mild): Secondary | ICD-10-CM | POA: Diagnosis not present

## 2019-01-25 DIAGNOSIS — N951 Menopausal and female climacteric states: Secondary | ICD-10-CM

## 2019-01-25 DIAGNOSIS — E7800 Pure hypercholesterolemia, unspecified: Secondary | ICD-10-CM

## 2019-01-25 DIAGNOSIS — E669 Obesity, unspecified: Secondary | ICD-10-CM

## 2019-01-25 NOTE — Progress Notes (Signed)
   Covid-19 Vaccination Clinic  Name:  Pamela Dodson    MRN: [id number] DOB: 1950-03-24  01/25/2019  [name] was observed post Covid-19 immunization for 15 minutes without incidence. She was provided with Vaccine Information Sheet and instruction to access the V-Safe system.   [name] was instructed to call 911 with any severe reactions post vaccine: Marland Kitchen Difficulty breathing  . Swelling of your face and throat  . A fast heartbeat  . A bad rash all over your body  . Dizziness and weakness    Immunizations Administered    Name Date Dose VIS Date Route   Pfizer COVID-19 Vaccine 01/25/2019  5:27 PM 0.3 mL 12/15/2018 Intramuscular   Manufacturer: [place]   Lot: [id number]   [place]: [id number]

## 2019-01-25 NOTE — Progress Notes (Signed)
Name: Pamela Dodson   MRN: [id number]    DOB: 1950-07-19   Date:01/25/2019       Progress Note  Subjective  Chief Complaint  Chief Complaint  Patient presents with  . Follow-up    I connected with  Pamela Dodson on 01/25/19 at 12:40 PM EST by telephone and verified that I am speaking with the correct person using two identifiers.  I discussed the limitations, risks, security and privacy concerns of performing an evaluation and management service by telephone and the availability of in person appointments. Staff also discussed with the patient that there may be a patient responsible charge related to this service. Patient Location: Home Provider Location: Office Additional Individuals present: None Initially video visit, lost connection, switched to telephonic visit.  HPI  Social: she is a Radiographer, therapeutic, is working for [employer], and plays for [employer], Social research officer, government for [employer].   Hypertension - Not checking BP's at home. Patient is on amlodipine 10mg  and benazepril 40mg  daily.  Takes medications as prescribed with no missed doses a month.  She is relatively compliant with low-salt diet.  Denies chest pain, headaches, blurry vision, shortness of breath, BLE edema, lightheadedness/dizziness.   Hyperlipidemia Patient rx atorvastatin 80mg  daily and fishoils Takes medications as prescribed with no missed doses a month.  Diet:vegetables daily, red meats a few times a week- more chicken and fish Denies myalgias  Prediabetes/Obesity Has been working on weight loss, though with COVID-19 pandemic it has been harder to stay active.  She denies polyuria, polydipsia, or polyphagia.  Hypothyroidism Patient rx levothryoxine 175 mcg daily. Has been on this dose for 1 year.  Patient denies fatigue/palpitations, insomnia, constipation/diarrhea, cold intolerance.  Does have hotflashes - taking venlafaxine which works well and would like to maintain.   CKD: Seeing Dr. Holley Raring for management -  last follow up was October 2020.  Takes PRN acetaminophen for chronic lower back and hip pain - stopped NSAIDs and kidney function improved.   Patient Active Problem List   Diagnosis Date Noted  . Benign essential hypertension 10/10/2018  . Chronic kidney disease, stage II (mild) 10/10/2018  . 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 Obesity (BMI 30.0-34.9) 01/16/2018  . Proteinuria 07/15/2017  . Urine test positive for microalbuminuria 07/15/2017  . Prediabetes 07/12/2016  . Abnormal mammogram of left breast 07/21/2015  . Postsurgical intestinal bypass or anastomosis status   . Hypertension goal BP (blood pressure) < 150/90 08/07/2014  . Gastro-esophageal reflux disease without esophagitis 08/07/2014  . Hot flash, menopausal 08/07/2014  . Lumbar disc disease with radiculopathy 08/07/2014  . Dysmetabolic syndrome [id number]  . Hypothyroidism, postop 08/07/2014  . Generalized OA 08/07/2014  . Hypercholesterolemia without hypertriglyceridemia 08/07/2014  . Hernia of anterior abdominal wall 08/07/2014  . Vestibular neuronitis 08/07/2014  . Arthritis of hip 12/16/2010    Past Surgical History:  Procedure Laterality Date  . APPENDECTOMY    . CARPAL TUNNEL RELEASE Left 2016  . CESAREAN SECTION    . CHOLECYSTECTOMY    . COLON SURGERY     diverticulitic mass removed  . COLONOSCOPY WITH PROPOFOL N/A 08/15/2014   Procedure: COLONOSCOPY WITH PROPOFOL;  Surgeon: Lucilla Lame, MD;  Location: [facility];  Service: Endoscopy;  Laterality: N/A;  . GANGLION CYST EXCISION     L wrist  . GLOMUS TUMOR EXCISION Left 12/12/2017  . JOINT REPLACEMENT    . knee arthroscopy    . THYROIDECTOMY  2009  . TOTAL HIP ARTHROPLASTY  12/16/2010  Procedure: TOTAL HIP ARTHROPLASTY;  Surgeon: Kerin Salen;  Location: [facility];  Service: Orthopedics;  Laterality: Left;  Depuy  . TOTAL KNEE ARTHROPLASTY  2009    Family History  Problem Relation Age of Onset  . Cancer Mother        lung  . Myelodysplastic syndrome  Mother   . Hypothyroidism Mother   . Hypertension Brother   . Stroke Brother   . Atrial fibrillation Brother   . Birth defects Son        congenital genetic abnormality  . Dementia Maternal Grandfather   . Heart disease Paternal Grandmother        heart disease in her 54's  . Stroke Paternal Grandmother   . Heart attack Paternal Grandfather   . Heart disease Paternal Aunt   . Heart attack Paternal Aunt   . Stroke Paternal Aunt   . Atrial fibrillation Paternal Uncle     Social History   Socioeconomic History  . Marital status: Married    Spouse name: Not on file  . Number of children: 2  . Years of education: Not on file  . Highest education level: Not on file  Occupational History  . Not on file  Tobacco Use  . Smoking status: Never Smoker  . Smokeless tobacco: Never Used  Substance and Sexual Activity  . Alcohol use: Yes    Alcohol/week: 1.0 standard drinks    Types: 1 Glasses of wine per week    Comment: occasional  . Drug use: No  . Sexual activity: Yes    Partners: Male  Other Topics Concern  . Not on file  Social History Narrative  . Not on file   Social Determinants of Health   Financial Resource Strain: Low Risk   . Difficulty of Paying Living Expenses: Not hard at all  Food Insecurity: No Food Insecurity  . Worried About Charity fundraiser in the Last Year: Never true  . Ran Out of Food in the Last Year: Never true  Transportation Needs: No Transportation Needs  . Lack of Transportation (Medical): No  . Lack of Transportation (Non-Medical): No  Physical Activity: Inactive  . Days of Exercise per Week: 0 days  . Minutes of Exercise per Session: 0 min  Stress: No Stress Concern Present  . Feeling of Stress : Not at all  Social Connections: Unknown  . Frequency of Communication with Friends and Family: Patient refused  . Frequency of Social Gatherings with Friends and Family: Patient refused  . Attends Religious Services: Patient refused  . Active  Member of Clubs or Organizations: Patient refused  . Attends Archivist Meetings: Patient refused  . Marital Status: Married  Human resources officer Violence: Not At Risk  . Fear of Current or Ex-Partner: No  . Emotionally Abused: No  . Physically Abused: No  . Sexually Abused: No     Current Outpatient Medications:  .  amLODipine-benazepril (LOTREL) 10-40 MG capsule, Take 1 capsule by mouth daily., Disp: , Rfl:  .  Ascorbic Acid (VITAMIN C) 100 MG tablet, Take 100 mg by mouth daily., Disp: , Rfl:  .  aspirin EC 81 MG tablet, Take 1 tablet (81 mg total) by mouth daily., Disp: , Rfl:  .  atorvastatin (LIPITOR) 80 MG tablet, Take 1 tablet (80 mg total) by mouth daily., Disp: 90 tablet, Rfl: 3 .  Black Cohosh (REMIFEMIN PO), Take 1 tablet by mouth 2 (two) times daily. Over the counter medication. For hot flashes. ,  Disp: , Rfl:  .  Cholecalciferol 1000 UNITS capsule, Take 1 capsule by mouth daily., Disp: , Rfl:  .  levothyroxine (SYNTHROID) 175 MCG tablet, Take 1 tablet (175 mcg total) by mouth daily before breakfast., Disp: 90 tablet, Rfl: 3 .  Multiple Vitamin (MULTIVITAMIN) tablet, Take 1 tablet by mouth daily., Disp: , Rfl:  .  Omega-3 Fatty Acids (FISH OIL) 1000 MG CPDR, Take 1 capsule by mouth 2 (two) times daily., Disp: , Rfl:  .  venlafaxine XR (EFFEXOR-XR) 37.5 MG 24 hr capsule, Take 1 capsule (37.5 mg total) by mouth daily with breakfast., Disp: 90 capsule, Rfl: 3  No Known Allergies  I personally reviewed active problem list, medication list, allergies, notes from last encounter, lab results with the patient/caregiver today.   ROS  Constitutional: Negative for fever or weight change.  Respiratory: Negative for cough and shortness of breath.   Cardiovascular: Negative for chest pain or palpitations.  Gastrointestinal: Negative for abdominal pain, no bowel changes.  Musculoskeletal: Negative for gait problem or joint swelling.  Skin: Negative for rash.  Neurological:  Negative for dizziness or headache.  No other specific complaints in a complete review of systems (except as listed in HPI above).  Objective  Virtual encounter, vitals not obtained.  Body mass index is 34.19 kg/m.  Physical Exam Constitutional: Patient appears well-developed and well-nourished. No distress.  HENT: Head: Normocephalic and atraumatic.  Neck: Normal range of motion. Pulmonary/Chest: Effort normal. No respiratory distress. Speaking in complete sentences Neurological: Pt is alert and oriented to person, place, and time. Coordination, speech are normal Psychiatric: Patient has a normal mood and affect. behavior is normal. Judgment and thought content normal.    No results found for this or any previous visit (from the past 72 hour(s)).  PHQ2/9: Depression screen [facility] 2/9 01/25/2019 11/10/2018 07/24/2018 03/20/2018 01/16/2018  Decreased Interest 0 0 0 0 0  Down, Depressed, Hopeless 0 0 0 0 0  PHQ - 2 Score 0 0 0 0 0  Altered sleeping 0 - 0 0 0  Tired, decreased energy 0 - 0 0 0  Change in appetite 0 - 0 0 0  Feeling bad or failure about yourself  0 - 0 0 0  Trouble concentrating 0 - 0 0 0  Moving slowly or fidgety/restless 0 - 0 0 0  Suicidal thoughts 0 - 0 0 0  PHQ-9 Score 0 - 0 0 0  Difficult doing work/chores Not difficult at all - Not difficult at all Not difficult at all Not difficult at all   PHQ-2/9 Result is negative.    Fall Risk: Fall Risk  01/25/2019 11/10/2018 07/24/2018 03/20/2018 01/16/2018  Falls in the past year? 0 1 0 0 0  Number falls in past yr: 0 1 0 0 0  Comment - slipped around pool - - -  Injury with Fall? 0 0 0 0 0  Risk for fall due to : - History of fall(s) - - -  Follow up - Falls prevention discussed - - -    Assessment & Plan  1. Hypertension goal BP (blood pressure) < 150/90 - Continue current regimen - COMPLETE METABOLIC PANEL WITH GFR  2. Chronic kidney disease, stage II (mild) - Seeing Dr. Holley Raring, doing well off of NSAID  therapy - COMPLETE METABOLIC PANEL WITH GFR  3. Hypothyroidism, postop - TSH  4. Prediabetes - Hemoglobin A1c - COMPLETE METABOLIC PANEL WITH GFR  5. Obesity (BMI 30.0-34.9) - Discussed importance of 150 minutes of  physical activity weekly, eat two servings of fish weekly, eat one serving of tree nuts ( cashews, pistachios, pecans, almonds.Marland Kitchen) every other day, eat 6 servings of fruit/vegetables daily and drink plenty of water and avoid sweet beverages.  - COMPLETE METABOLIC PANEL WITH GFR  6. Hot flash, menopausal - Doing well on venlafaxine - TSH  7. Hypercholesterolemia without hypertriglyceridemia - Doing well on statin therapy. - Lipid panel  Recommend labwork - she is wary due to COVID-19 pandemic.  She is obtaining her first vaccine injection today, will plan to come in 2 weeks after her 2nd dose (in about 5-6 weeks' time) for labs, fasting, early morning (around 8am).  I discussed the assessment and treatment plan with the patient. The patient was provided an opportunity to ask questions and all were answered. The patient agreed with the plan and demonstrated an understanding of the instructions.   The patient was advised to call back or seek an in-person evaluation if the symptoms worsen or if the condition fails to improve as anticipated.  I provided 20 minutes of non-face-to-face time during this encounter.  Hubbard Hartshorn, FNP

## 2019-02-15 ENCOUNTER — Ambulatory Visit: Payer: Medicare Other | Attending: Internal Medicine

## 2019-02-15 DIAGNOSIS — Z23 Encounter for immunization: Secondary | ICD-10-CM | POA: Insufficient documentation

## 2019-02-15 NOTE — Progress Notes (Signed)
   Covid-19 Vaccination Clinic  Name:  Pamela Dodson    MRN: [id number] DOB: [birth year]  02/15/2019  [name] was observed post Covid-19 immunization for 15 minutes without incidence. She was provided with Vaccine Information Sheet and instruction to access the V-Safe system.   [name] was instructed to call 911 with any severe reactions post vaccine: Marland Kitchen Difficulty breathing  . Swelling of your face and throat  . A fast heartbeat  . A bad rash all over your body  . Dizziness and weakness    Immunizations Administered    Name Date Dose VIS Date Route   Pfizer COVID-19 Vaccine 02/15/2019 12:22 PM 0.3 mL 12/15/2018 Intramuscular   Manufacturer: [place]   Lot: [id number]   [place]: [id number]

## 2019-03-10 ENCOUNTER — Encounter: Payer: Self-pay | Admitting: Family Medicine

## 2019-03-10 LAB — LIPID PANEL
Cholesterol: 155 mg/dL (ref <200)
HDL: 42 mg/dL — ABNORMAL LOW (ref >=50)
LDL Cholesterol (Calc): 87 mg/dL (calc)
Non-HDL Cholesterol (Calc): 113 mg/dL (calc) (ref <130)
Total CHOL/HDL Ratio: 3.7 (calc) (ref <5.0)
Triglycerides: 163 mg/dL — ABNORMAL HIGH (ref <150)

## 2019-03-10 LAB — COMPLETE METABOLIC PANEL WITH GFR
AG Ratio: 1.5 (calc) (ref 1.0–2.5)
ALT: 19 U/L (ref 6–29)
AST: 20 U/L (ref 10–35)
Albumin: 4.1 g/dL (ref 3.6–5.1)
Alkaline phosphatase (APISO): 75 U/L (ref 37–153)
BUN/Creatinine Ratio: 32 (calc) — ABNORMAL HIGH (ref 6–22)
BUN: 19 mg/dL (ref 7–25)
CO2: 28 mmol/L (ref 20–32)
Calcium: 9.3 mg/dL (ref 8.6–10.4)
Chloride: 105 mmol/L (ref 98–110)
Creat: 0.59 mg/dL — ABNORMAL LOW (ref 0.60–0.93)
GFR, Est African American: 108 mL/min/{1.73_m2} (ref >=60)
GFR, Est Non African American: 93 mL/min/{1.73_m2} (ref >=60)
Globulin: 2.8 g/dL (calc) (ref 1.9–3.7)
Glucose, Bld: 105 mg/dL — ABNORMAL HIGH (ref 65–99)
Potassium: 4.1 mmol/L (ref 3.5–5.3)
Sodium: 143 mmol/L (ref 135–146)
Total Bilirubin: 0.5 mg/dL (ref 0.2–1.2)
Total Protein: 6.9 g/dL (ref 6.1–8.1)

## 2019-03-10 LAB — HEMOGLOBIN A1C
Hgb A1c MFr Bld: 5.7 % of total Hgb — ABNORMAL HIGH (ref <5.7)
Mean Plasma Glucose: 117 (calc)
eAG (mmol/L): 6.5 (calc)

## 2019-03-10 LAB — TSH: TSH: 0.71 mIU/L (ref 0.40–4.50)

## 2019-03-13 ENCOUNTER — Emergency Department: Payer: Medicare Other

## 2019-03-13 ENCOUNTER — Other Ambulatory Visit: Payer: Self-pay

## 2019-03-13 ENCOUNTER — Encounter: Payer: Self-pay | Admitting: Family Medicine

## 2019-03-13 ENCOUNTER — Inpatient Hospital Stay
Admission: EM | Admit: 2019-03-13 | Discharge: 2019-03-17 | DRG: 389 | Disposition: A | Discharge status: Home / Self Care | Payer: Medicare Other | Attending: Internal Medicine | Admitting: Internal Medicine

## 2019-03-13 ENCOUNTER — Encounter: Payer: Self-pay | Admitting: *Deleted

## 2019-03-13 DIAGNOSIS — K56609 Unspecified intestinal obstruction, unspecified as to partial versus complete obstruction: Secondary | ICD-10-CM | POA: Diagnosis present

## 2019-03-13 DIAGNOSIS — Z8349 Family history of other endocrine, nutritional and metabolic diseases: Secondary | ICD-10-CM

## 2019-03-13 DIAGNOSIS — Z933 Colostomy status: Secondary | ICD-10-CM | POA: Diagnosis not present

## 2019-03-13 DIAGNOSIS — R000 Tachycardia, unspecified: Secondary | ICD-10-CM | POA: Diagnosis present

## 2019-03-13 DIAGNOSIS — Z9049 Acquired absence of other specified parts of digestive tract: Secondary | ICD-10-CM | POA: Diagnosis not present

## 2019-03-13 DIAGNOSIS — Z6833 Body mass index (BMI) 33.0-33.9, adult: Secondary | ICD-10-CM | POA: Diagnosis not present

## 2019-03-13 DIAGNOSIS — Z96642 Presence of left artificial hip joint: Secondary | ICD-10-CM | POA: Diagnosis present

## 2019-03-13 DIAGNOSIS — E890 Postprocedural hypothyroidism: Secondary | ICD-10-CM

## 2019-03-13 DIAGNOSIS — E669 Obesity, unspecified: Secondary | ICD-10-CM | POA: Diagnosis present

## 2019-03-13 DIAGNOSIS — Z823 Family history of stroke: Secondary | ICD-10-CM

## 2019-03-13 DIAGNOSIS — K5792 Diverticulitis of intestine, part unspecified, without perforation or abscess without bleeding: Secondary | ICD-10-CM | POA: Diagnosis present

## 2019-03-13 DIAGNOSIS — Z79890 Hormone replacement therapy (postmenopausal): Secondary | ICD-10-CM | POA: Diagnosis not present

## 2019-03-13 DIAGNOSIS — I10 Essential (primary) hypertension: Secondary | ICD-10-CM | POA: Diagnosis present

## 2019-03-13 DIAGNOSIS — K219 Gastro-esophageal reflux disease without esophagitis: Secondary | ICD-10-CM | POA: Diagnosis present

## 2019-03-13 DIAGNOSIS — Z85850 Personal history of malignant neoplasm of thyroid: Secondary | ICD-10-CM

## 2019-03-13 DIAGNOSIS — I129 Hypertensive chronic kidney disease with stage 1 through stage 4 chronic kidney disease, or unspecified chronic kidney disease: Secondary | ICD-10-CM | POA: Diagnosis present

## 2019-03-13 DIAGNOSIS — Z801 Family history of malignant neoplasm of trachea, bronchus and lung: Secondary | ICD-10-CM

## 2019-03-13 DIAGNOSIS — I2510 Atherosclerotic heart disease of native coronary artery without angina pectoris: Secondary | ICD-10-CM | POA: Insufficient documentation

## 2019-03-13 DIAGNOSIS — E876 Hypokalemia: Secondary | ICD-10-CM | POA: Diagnosis present

## 2019-03-13 DIAGNOSIS — Z20822 Contact with and (suspected) exposure to covid-19: Secondary | ICD-10-CM | POA: Diagnosis present

## 2019-03-13 DIAGNOSIS — D1800 Hemangioma unspecified site: Secondary | ICD-10-CM | POA: Diagnosis present

## 2019-03-13 DIAGNOSIS — Z8249 Family history of ischemic heart disease and other diseases of the circulatory system: Secondary | ICD-10-CM | POA: Diagnosis not present

## 2019-03-13 DIAGNOSIS — N182 Chronic kidney disease, stage 2 (mild): Secondary | ICD-10-CM | POA: Diagnosis present

## 2019-03-13 DIAGNOSIS — K56600 Partial intestinal obstruction, unspecified as to cause: Secondary | ICD-10-CM | POA: Diagnosis present

## 2019-03-13 DIAGNOSIS — I700 Atherosclerosis of aorta: Secondary | ICD-10-CM | POA: Diagnosis present

## 2019-03-13 DIAGNOSIS — Z96659 Presence of unspecified artificial knee joint: Secondary | ICD-10-CM | POA: Diagnosis present

## 2019-03-13 HISTORY — DX: Unspecified intestinal obstruction, unspecified as to partial versus complete obstruction: K56.609

## 2019-03-13 LAB — CBC
HCT: 43.5 % (ref 36.0–46.0)
Hemoglobin: 14.3 g/dL (ref 12.0–15.0)
MCH: 30.8 pg (ref 26.0–34.0)
MCHC: 32.9 g/dL (ref 30.0–36.0)
MCV: 93.8 fL (ref 80.0–100.0)
Platelets: 277 10*3/uL (ref 150–400)
RBC: 4.64 MIL/uL (ref 3.87–5.11)
RDW: 13 % (ref 11.5–15.5)
WBC: 10.3 10*3/uL (ref 4.0–10.5)
nRBC: 0 % (ref 0.0–0.2)

## 2019-03-13 LAB — URINALYSIS, COMPLETE (UACMP) WITH MICROSCOPIC
Bacteria, UA: NONE SEEN
Bilirubin Urine: NEGATIVE
Glucose, UA: NEGATIVE mg/dL
Leukocytes,Ua: NEGATIVE
Nitrite: NEGATIVE
Protein, ur: >300 mg/dL — AB
Specific Gravity, Urine: >1.03 — ABNORMAL HIGH (ref 1.005–1.030)
Squamous Epithelial / HPF: NONE SEEN (ref 0–5)
pH: 5.5 (ref 5.0–8.0)

## 2019-03-13 LAB — COMPREHENSIVE METABOLIC PANEL
ALT: 22 U/L (ref 0–44)
AST: 24 U/L (ref 15–41)
Albumin: 4.5 g/dL (ref 3.5–5.0)
Alkaline Phosphatase: 74 U/L (ref 38–126)
Anion gap: 8 (ref 5–15)
BUN: 25 mg/dL — ABNORMAL HIGH (ref 8–23)
CO2: 31 mmol/L (ref 22–32)
Calcium: 9.7 mg/dL (ref 8.9–10.3)
Chloride: 100 mmol/L (ref 98–111)
Creatinine, Ser: 0.76 mg/dL (ref 0.44–1.00)
GFR calc Af Amer: >60 mL/min (ref >60)
GFR calc non Af Amer: >60 mL/min (ref >60)
Glucose, Bld: 156 mg/dL — ABNORMAL HIGH (ref 70–99)
Potassium: 4.5 mmol/L (ref 3.5–5.1)
Sodium: 139 mmol/L (ref 135–145)
Total Bilirubin: 0.9 mg/dL (ref 0.3–1.2)
Total Protein: 7.9 g/dL (ref 6.5–8.1)

## 2019-03-13 LAB — RESPIRATORY PANEL BY RT PCR (FLU A&B, COVID)
Influenza A by PCR: NEGATIVE
Influenza B by PCR: NEGATIVE
SARS Coronavirus 2 by RT PCR: NEGATIVE

## 2019-03-13 LAB — LIPASE, BLOOD: Lipase: 27 U/L (ref 11–51)

## 2019-03-13 MED ORDER — PANTOPRAZOLE SODIUM 40 MG IV SOLR: 40.0000 mg | Freq: Two times a day (BID) | INTRAVENOUS | Status: DC

## 2019-03-13 MED ORDER — ACETAMINOPHEN 325 MG PO TABS
650.0000 mg | ORAL_TABLET | Freq: Four times a day (QID) | ORAL | Status: DC | PRN
  Administered 2019-03-16: 650 mg via ORAL
  Filled 2019-03-13: qty 2

## 2019-03-13 MED ORDER — LACTATED RINGERS IV SOLN: INTRAVENOUS | Status: DC

## 2019-03-13 MED ORDER — VENLAFAXINE HCL ER 37.5 MG PO CP24
37.5000 mg | ORAL_CAPSULE | Freq: Every day | ORAL | Status: DC
  Filled 2019-03-13 (×2): qty 1

## 2019-03-13 MED ORDER — VENLAFAXINE HCL ER 37.5 MG PO CP24: 37.5000 mg | ORAL_CAPSULE | Freq: Every day | ORAL | Status: DC

## 2019-03-13 MED ORDER — ORAL CARE MOUTH RINSE
15.0000 mL | Freq: Two times a day (BID) | OROMUCOSAL | Status: DC
  Administered 2019-03-13 – 2019-03-15 (×4): 15 mL via OROMUCOSAL

## 2019-03-13 MED ORDER — MORPHINE SULFATE (PF) 2 MG/ML IV SOLN
INTRAVENOUS | Status: AC
  Filled 2019-03-13: qty 1

## 2019-03-13 MED ORDER — PANTOPRAZOLE SODIUM 40 MG IV SOLR
40.0000 mg | Freq: Two times a day (BID) | INTRAVENOUS | Status: DC
  Administered 2019-03-13 – 2019-03-16 (×6): 40 mg via INTRAVENOUS
  Filled 2019-03-13 (×7): qty 40

## 2019-03-13 MED ORDER — ACETAMINOPHEN 650 MG RE SUPP: 650.0000 mg | Freq: Four times a day (QID) | RECTAL | Status: DC | PRN

## 2019-03-13 MED ORDER — SODIUM CHLORIDE 0.9 % IV BOLUS
500.0000 mL | Freq: Once | INTRAVENOUS | Status: AC
  Administered 2019-03-13: 500 mL via INTRAVENOUS

## 2019-03-13 MED ORDER — MORPHINE SULFATE (PF) 2 MG/ML IV SOLN
2.0000 mg | INTRAVENOUS | Status: DC | PRN
  Administered 2019-03-13 – 2019-03-15 (×12): 2 mg via INTRAVENOUS
  Filled 2019-03-13 (×11): qty 1

## 2019-03-13 MED ORDER — VENLAFAXINE HCL ER 37.5 MG PO CP24
37.5000 mg | ORAL_CAPSULE | Freq: Every day | ORAL | Status: DC
  Administered 2019-03-15 – 2019-03-16 (×2): 37.5 mg via ORAL
  Filled 2019-03-13 (×5): qty 1

## 2019-03-13 MED ORDER — SODIUM CHLORIDE 0.9 % IV SOLN: Freq: Once | INTRAVENOUS | Status: AC

## 2019-03-13 MED ORDER — DEXTROSE IN LACTATED RINGERS 5 % IV SOLN: INTRAVENOUS | Status: DC

## 2019-03-13 MED ORDER — LEVOTHYROXINE SODIUM 100 MCG/5ML IV SOLN
88.0000 ug | Freq: Every day | INTRAVENOUS | Status: DC
  Administered 2019-03-13 – 2019-03-16 (×4): 88 ug via INTRAVENOUS
  Filled 2019-03-13 (×4): qty 5

## 2019-03-13 MED ORDER — ONDANSETRON HCL 4 MG/2ML IJ SOLN
4.0000 mg | Freq: Four times a day (QID) | INTRAMUSCULAR | Status: DC | PRN
  Administered 2019-03-14 – 2019-03-15 (×4): 4 mg via INTRAVENOUS
  Filled 2019-03-13 (×4): qty 2

## 2019-03-13 MED ORDER — LABETALOL HCL 5 MG/ML IV SOLN: 10.0000 mg | INTRAVENOUS | Status: DC | PRN

## 2019-03-13 MED ORDER — ONDANSETRON HCL 4 MG PO TABS: 4.0000 mg | ORAL_TABLET | Freq: Four times a day (QID) | ORAL | Status: DC | PRN

## 2019-03-13 MED ORDER — IOHEXOL 300 MG/ML  SOLN
100.0000 mL | Freq: Once | INTRAMUSCULAR | Status: AC | PRN
  Administered 2019-03-13: 100 mL via INTRAVENOUS

## 2019-03-13 MED ORDER — BENZOCAINE 20 % MT AERO
INHALATION_SPRAY | Freq: Three times a day (TID) | OROMUCOSAL | Status: DC | PRN
  Filled 2019-03-13: qty 57

## 2019-03-13 MED ORDER — MORPHINE SULFATE (PF) 4 MG/ML IV SOLN
4.0000 mg | Freq: Once | INTRAVENOUS | Status: AC
  Administered 2019-03-13: 4 mg via INTRAVENOUS
  Filled 2019-03-13: qty 1

## 2019-03-13 MED ORDER — BENZOCAINE 20 % MT SOLN: Freq: Three times a day (TID) | OROMUCOSAL | Status: DC | PRN

## 2019-03-13 MED ORDER — ONDANSETRON HCL 4 MG/2ML IJ SOLN
4.0000 mg | Freq: Once | INTRAMUSCULAR | Status: AC
  Administered 2019-03-13: 4 mg via INTRAVENOUS
  Filled 2019-03-13: qty 2

## 2019-03-13 MED ORDER — ENOXAPARIN SODIUM 40 MG/0.4ML ~~LOC~~ SOLN
40.0000 mg | SUBCUTANEOUS | Status: DC
  Administered 2019-03-13 – 2019-03-17 (×5): 40 mg via SUBCUTANEOUS
  Filled 2019-03-13 (×5): qty 0.4

## 2019-03-13 NOTE — Consult Note (Addendum)
[facility] SURGICAL CONSULTATION NOTE (initial) - [id number]   HISTORY OF PRESENT ILLNESS (HPI):  69 y.o. female presented to [facility] ED early this morning for evaluation of abdominal pain. Patient reports the acute onset of acute periumbilical and upper abdominal pain which started last night around 6 PM. The pain comes and goes intermittent and at the worst is a 10/10 in intensity. Nothing seemed to make the pain better. She reports associated nausea and emesis with the pain. No fever, chills, cough, congestion, SOB, or CP. She is not passing flatus. Previous abdominal surgeries include bowel resection secondary to diverticulitis, c-section, appendectomy, and cholecystectomy. Work up in the ED was concerning for small bowel obstruction. NGT was placed and she was admitted to the medicine service.   Surgery is consulted by emergency medicine physician Dr. Rudene Re, MD in this context for evaluation and management of small bowel obstruction.   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 Marlou Starks, II in [place]   H/O malignant neoplasm of thyroid 08/07/2014   S/P resection x2    History of knee replacement 08/07/2014   Hypertension    Hypothyroidism    Neuromuscular disorder (H[facility])    numbnes lower legs s/p knee replacements   PONV (postoperative nausea and vomiting)    usually needs zofran/antiemetic prior to surgery   Shingles rash 01/22/2015   Thyroid cancer ([place])    Wears contact lenses      PAST SURGICAL HISTORY ([place]):  Past Surgical History:  Procedure Laterality Date   APPENDECTOMY     CARPAL TUNNEL RELEASE Left 2016   CESAREAN SECTION     CHOLECYSTECTOMY     COLON SURGERY     diverticulitic mass removed   COLONOSCOPY WITH PROPOFOL N/A 08/15/2014   Procedure: COLONOSCOPY WITH PROPOFOL;  Surgeon: Lucilla Lame, MD;  Location: [facility];  Service: Endoscopy;  Laterality: N/A;   GANGLION CYST EXCISION     L wrist   GLOMUS TUMOR EXCISION Left 12/12/2017   JOINT REPLACEMENT     knee arthroscopy     THYROIDECTOMY  2009   TOTAL HIP ARTHROPLASTY  12/16/2010   Procedure: TOTAL HIP ARTHROPLASTY;  Surgeon: Kerin Salen;  Location: [facility];  Service: Orthopedics;  Laterality: Left;  Depuy   TOTAL KNEE ARTHROPLASTY  2009     MEDICATIONS:  Prior to Admission medications   Medication Sig Start Date End Date Taking? Authorizing Provider  amLODipine-benazepril (LOTREL) 10-40 MG capsule Take 1 capsule by mouth at bedtime.    Yes [provider]  Ascorbic Acid (VITAMIN C) 100 MG tablet Take 100 mg by mouth daily.   Yes [provider]  atorvastatin (LIPITOR) 80 MG tablet Take 1 tablet (80 mg total) by mouth daily. Patient taking differently: Take 80 mg by mouth at bedtime.  07/24/18  Yes Poulose, Bethel Born, NP  Cholecalciferol 1000 UNITS capsule Take 1 capsule by mouth daily.   Yes [provider]  levothyroxine (SYNTHROID) 175 MCG tablet Take 1 tablet (175 mcg total) by mouth daily before breakfast. 07/24/18  Yes Poulose, Bethel Born, NP  loratadine (CLARITIN) 10 MG tablet Take 10 mg by mouth daily.   Yes [provider]  Multiple Vitamin (MULTIVITAMIN) tablet Take 1  tablet by mouth daily.   Yes [provider]  Omega-3 Fatty Acids (FISH OIL) 1000 MG CPDR Take 1 capsule by mouth 2 (two) times daily. 11/02/16  Yes Lada, Satira Anis, MD  venlafaxine XR (EFFEXOR-XR) 37.5 MG 24 hr capsule Take 1 capsule (37.5 mg total) by mouth daily with breakfast. 07/24/18  Yes Poulose, Bethel Born, NP     ALLERGIES:  No Known Allergies   SOCIAL HISTORY:  Social History   Socioeconomic History   Marital status: Married    Spouse name: Not on file   Number of children: 2   Years of education: Not on file    Highest education level: Not on file  Occupational History   Not on file  Tobacco Use   Smoking status: Never Smoker   Smokeless tobacco: Never Used  Substance and Sexual Activity   Alcohol use: Yes    Alcohol/week: 1.0 standard drinks    Types: 1 Glasses of wine per week    Comment: occasional   Drug use: No   Sexual activity: Yes    Partners: Male  Other Topics Concern   Not on file  Social History Narrative   Not on file   Social Determinants of Health   Financial Resource Strain: Low Risk    Difficulty of Paying Living Expenses: Not hard at all  Food Insecurity: No Food Insecurity   Worried About Charity fundraiser in the Last Year: Never true   [place] in the Last Year: Never true  Transportation Needs: No Transportation Needs   Lack of Transportation (Medical): No   Lack of Transportation (Non-Medical): No  Physical Activity: Inactive   Days of Exercise per Week: 0 days   Minutes of Exercise per Session: 0 min  Stress: No Stress Concern Present   Feeling of Stress : Not at all  Social Connections: Unknown   Frequency of Communication with Friends and Family: Patient refused   Frequency of Social Gatherings with Friends and Family: Patient refused   Attends Religious Services: Patient refused   Printmaker: Patient refused   Attends Music therapist: Patient refused   Marital Status: Married  Human resources officer Violence: Not At Risk   Fear of Current or Ex-Partner: No   Emotionally Abused: No   Physically Abused: No   Sexually Abused: No     FAMILY HISTORY:  Family History  Problem Relation Age of Onset   Cancer Mother        lung   Myelodysplastic syndrome Mother    Hypothyroidism Mother    Hypertension Brother    Stroke Brother    Atrial fibrillation Brother    Birth defects Son        congenital genetic abnormality   Dementia Maternal Grandfather    Heart disease Paternal Grandmother        heart  disease in her 44's   Stroke Paternal Grandmother    Heart attack Paternal Grandfather    Heart disease Paternal Aunt    Heart attack Paternal Aunt    Stroke Paternal Aunt    Atrial fibrillation Paternal Uncle       REVIEW OF SYSTEMS:  Review of Systems  Constitutional: Negative for chills and fever.  HENT: Negative for congestion and sore throat.   Respiratory: Negative for cough and shortness of breath.   Cardiovascular: Negative for chest pain and palpitations.  Gastrointestinal: Positive for abdominal pain, nausea and vomiting. Negative for blood  in stool, constipation and diarrhea.  Genitourinary: Negative for dysuria and urgency.  All other systems reviewed and are negative.   VITAL SIGNS:  Temp:  [98.4 F (36.9 C)] 98.4 F (36.9 C) (03/09 0138) Pulse Rate:  [74-105] 74 (03/09 1000) Resp:  [10-22] 12 (03/09 1000) BP: (119-156)/(65-101) 122/65 (03/09 1000) SpO2:  [87 %-98 %] 93 % (03/09 1000) Weight:  [86.2 kg] 86.2 kg (03/09 0139)     Height: 5\' 3"  (160 cm) Weight: 86.2 kg BMI (Calculated): 33.67   INTAKE/OUTPUT:  03/08 0701 - 03/09 0700 In: -  Out: 450 [Emesis/NG output:450]  PHYSICAL EXAM:  Physical Exam Vitals and nursing note reviewed.  Constitutional:      General: She is not in acute distress.    Appearance: She is well-developed. She is obese. She is not ill-appearing.  HENT:     Head: Normocephalic and atraumatic.     Comments: NGT in place Eyes:     General: No scleral icterus.    Extraocular Movements: Extraocular movements intact.  Cardiovascular:     Rate and Rhythm: Normal rate and regular rhythm.     Heart sounds: Normal heart sounds. No murmur.  Pulmonary:     Effort: Pulmonary effort is normal. No respiratory distress.     Breath sounds: Normal breath sounds.     Comments: On [place] Abdominal:     General: Abdomen is flat. A surgical scar is present. There is no distension.     Palpations: Abdomen is soft.     Tenderness: There is no  abdominal tenderness. There is no guarding or rebound.     Comments: Multiple previous surgical scars, abdomen is soft, non-tender, no appreciable distension, no peritonitis   Genitourinary:    Comments: Deferred Skin:    General: Skin is warm and dry.     Coloration: Skin is not jaundiced or pale.  Neurological:     General: No focal deficit present.     Mental Status: She is alert and oriented to person, place, and time.  Psychiatric:        Mood and Affect: Mood normal.        Behavior: Behavior normal.      Labs:  CBC Latest Ref Rng & Units 03/13/2019 01/06/2016 08/14/2014  WBC 4.0 - 10.5 K/uL 10.3 5.4 5.3  Hemoglobin 12.0 - 15.0 g/dL 14.3 13.8 12.9  Hematocrit 36.0 - 46.0 % 43.5 41.9 38.7  Platelets 150 - 400 K/uL 277 279 242   CMP Latest Ref Rng & Units 03/13/2019 03/09/2019 01/16/2018  Glucose 70 - 99 mg/dL 156(H) 105(H) 98  BUN 8 - 23 mg/dL 25(H) 19 14  Creatinine 0.44 - 1.00 mg/dL 0.76 0.59(L) 0.62  Sodium 135 - 145 mmol/L 139 143 141  Potassium 3.5 - 5.1 mmol/L 4.5 4.1 4.7  Chloride 98 - 111 mmol/L 100 105 101  CO2 22 - 32 mmol/L 31 28 30   Calcium 8.9 - 10.3 mg/dL 9.7 9.3 9.6  Total Protein 6.5 - 8.1 g/dL 7.9 6.9 6.9  Total Bilirubin 0.3 - 1.2 mg/dL 0.9 0.5 0.6  Alkaline Phos 38 - 126 U/L 74 - -  AST 15 - 41 U/L 24 20 22   ALT 0 - 44 U/L 22 19 22      Imaging studies:   CT Abdomen/Pelvis (03/13/2019) personally reviewed showing small bowel dilation with transition point near previous sigmoid colectomy, and radiologist report reviewed:  IMPRESSION: 1. 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 2.  Aortic Atherosclerosis (ICD10-I70.0).  Coronary atherosclerosis.    Assessment/Plan: (ICD-10's: K41.609) 69 y.o. 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   - Admit to medicine service  - Agree with NGT  decompression; monitor output  - No emergent surgical intervention; she understands that is she were to fail clinical management she may require explorations   - NPO + IVF  - Add IV PPI for irritation from NGT  - pain control prn (minimize narcotics); antiemetics prn  - monitor abdominal examination; on-going bowel function  - Can consider serial KUBs if needed for reassessment  - mobilization encouraged  - Further management per primary service   All of the above findings and recommendations were discussed with the patient, and all of patient's questions were answered to her expressed satisfaction.  Thank you for the opportunity to participate in this patient's care.   -- Edison Simon, PA-C [facility] 03/13/2019, 1:53 PM [phone] M-F: 7am - 4pm  I saw and evaluated the patient.  I agree with the above documentation, exam, and plan, which I have edited where appropriate. Fredirick Maudlin  5:45 PM

## 2019-03-13 NOTE — ED Notes (Signed)
Admitting MD at bedside.

## 2019-03-13 NOTE — ED Triage Notes (Signed)
Pt has upper abd pain since 1800 today pt has cramping. hx diverticulitis.  Pt has vomiting x 3.  No diarrhea   Pt alert

## 2019-03-13 NOTE — ED Provider Notes (Signed)
[facility] Emergency Department Provider Note  ____________________________________________  Time seen: Approximately 5:43 AM  I have reviewed the triage vital signs and the nursing notes.   HISTORY  Chief Complaint Abdominal Pain   HPI Pamela Dodson is a 69 y.o. female with history of diverticulitis status post partial colectomy, cholecystectomy, appendectomy, thyroid cancer, hypertension who presents for evaluation of abdominal pain.  She reports that her symptoms started at 6:00 S upper abdominal cramping.  She has had 3 episodes of nonbloody nonbilious emesis.  No diarrhea or constipation.  Had a bowel movement yesterday.  Since the pain started patient has been unable to pass flatus.  She denies fever or chills.  The pain is currently 9 out of 10, constant and nonradiating.  No chest pain or shortness of breath.   Past Medical History:  Diagnosis Date  . Arthritis   . Blood transfusion   . Cancer (HCC)    hx thyroid cancer  . Full code status 11/02/2016  . GERD (gastroesophageal reflux disease)   . 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 Marlou Starks, II in [place]  . H/O malignant neoplasm of thyroid 08/07/2014   S/P resection x2   . History of knee replacement 08/07/2014  . Hypertension   . Hypothyroidism   . Neuromuscular disorder (HCC)    numbnes lower legs s/p knee replacements  . PONV (postoperative nausea and vomiting)    usually needs zofran/antiemetic prior to surgery  . Shingles rash 01/22/2015  . Thyroid cancer ([place])   . Wears contact lenses     Patient Active Problem List   Diagnosis Date Noted  . Benign essential hypertension 10/10/2018  . Chronic kidney disease, stage II (mild) 10/10/2018  . 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 Obesity (BMI 30.0-34.9) 01/16/2018  . Proteinuria 07/15/2017  . Urine test positive for microalbuminuria 07/15/2017  . Prediabetes 07/12/2016  . Abnormal mammogram of left breast 07/21/2015  . Postsurgical intestinal  bypass or anastomosis status   . Hypertension goal BP (blood pressure) < 150/90 08/07/2014  . Gastro-esophageal reflux disease without esophagitis 08/07/2014  . Hot flash, menopausal 08/07/2014  . Lumbar disc disease with radiculopathy 08/07/2014  . Dysmetabolic syndrome [id number]  . Hypothyroidism, postop 08/07/2014  . Generalized OA 08/07/2014  . Hypercholesterolemia without hypertriglyceridemia 08/07/2014  . Hernia of anterior abdominal wall 08/07/2014  . Vestibular neuronitis 08/07/2014  . Arthritis of hip 12/16/2010    Past Surgical History:  Procedure Laterality Date  . APPENDECTOMY    . CARPAL TUNNEL RELEASE Left 2016  . CESAREAN SECTION    . CHOLECYSTECTOMY    . COLON SURGERY     diverticulitic mass removed  . COLONOSCOPY WITH PROPOFOL N/A 08/15/2014   Procedure: COLONOSCOPY WITH PROPOFOL;  Surgeon: Lucilla Lame, MD;  Location: [place];  Service: Endoscopy;  Laterality: N/A;  . GANGLION CYST EXCISION     L wrist  . GLOMUS TUMOR EXCISION Left 12/12/2017  . JOINT REPLACEMENT    . knee arthroscopy    . THYROIDECTOMY  2009  . TOTAL HIP ARTHROPLASTY  12/16/2010   Procedure: TOTAL HIP ARTHROPLASTY;  Surgeon: Kerin Salen;  Location: [place];  Service: Orthopedics;  Laterality: Left;  [place]  . TOTAL KNEE ARTHROPLASTY  2009    Prior to Admission medications   Medication Sig Start Date End Date Taking? Authorizing Provider  amLODipine-benazepril (LOTREL) 10-40 MG capsule Take 1 capsule by mouth at bedtime.    Yes [provider]  Ascorbic Acid (VITAMIN  C) 100 MG tablet Take 100 mg by mouth daily.   Yes [provider]  atorvastatin (LIPITOR) 80 MG tablet Take 1 tablet (80 mg total) by mouth daily. Patient taking differently: Take 80 mg by mouth at bedtime.  07/24/18  Yes Poulose, Bethel Born, NP  Cholecalciferol 1000 UNITS capsule Take 1 capsule by mouth daily.   Yes [provider]  levothyroxine (SYNTHROID) 175 MCG tablet Take 1 tablet  (175 mcg total) by mouth daily before breakfast. 07/24/18  Yes Poulose, Bethel Born, NP  loratadine (CLARITIN) 10 MG tablet Take 10 mg by mouth daily.   Yes [provider]  Multiple Vitamin (MULTIVITAMIN) tablet Take 1 tablet by mouth daily.   Yes [provider]  Omega-3 Fatty Acids (FISH OIL) 1000 MG CPDR Take 1 capsule by mouth 2 (two) times daily. 11/02/16  Yes Arnetha Courser, MD  venlafaxine XR (EFFEXOR-XR) 37.5 MG 24 hr capsule Take 1 capsule (37.5 mg total) by mouth daily with breakfast. 07/24/18  Yes Poulose, Bethel Born, NP    Allergies Patient has no known allergies.  Family History  Problem Relation Age of Onset  . Cancer Mother        lung  . Myelodysplastic syndrome Mother   . Hypothyroidism Mother   . Hypertension Brother   . Stroke Brother   . Atrial fibrillation Brother   . Birth defects Son        congenital genetic abnormality  . Dementia Maternal Grandfather   . Heart disease Paternal Grandmother        heart disease in her 41's  . Stroke Paternal Grandmother   . Heart attack Paternal Grandfather   . Heart disease Paternal Aunt   . Heart attack Paternal Aunt   . Stroke Paternal Aunt   . Atrial fibrillation Paternal Uncle     Social History Social History   Tobacco Use  . Smoking status: Never Smoker  . Smokeless tobacco: Never Used  Substance Use Topics  . Alcohol use: Yes    Alcohol/week: 1.0 standard drinks    Types: 1 Glasses of wine per week    Comment: occasional  . Drug use: No    Review of Systems  Constitutional: Negative for fever. Eyes: Negative for visual changes. ENT: Negative for sore throat. Neck: No neck pain  Cardiovascular: Negative for chest pain. Respiratory: Negative for shortness of breath. Gastrointestinal: + abdominal pain, nausea, and vomiting. No diarrhea. Genitourinary: Negative for dysuria. Musculoskeletal: Negative for back pain. Skin: Negative for rash. Neurological: Negative for headaches,  weakness or numbness. Psych: No SI or HI  ____________________________________________   PHYSICAL EXAM:  VITAL SIGNS: ED Triage Vitals  Enc Vitals Group     BP 03/13/19 0138 (!) 156/101     Pulse Rate 03/13/19 0138 (!) 105     Resp 03/13/19 0138 18     Temp 03/13/19 0138 98.4 F (36.9 C)     Temp Source 03/13/19 0138 Oral     SpO2 03/13/19 0138 96 %     Weight 03/13/19 0139 190 lb (86.2 kg)     Height 03/13/19 0139 5\' 3"  (1.6 m)     Head Circumference --      Peak Flow --      Pain Score 03/13/19 0139 10     Pain Loc --      Pain Edu? --      Excl. in [place]? --     Constitutional: Alert and oriented. Well appearing and in  no apparent distress. HEENT:      Head: Normocephalic and atraumatic.         Eyes: Conjunctivae are normal. Sclera is non-icteric.       Mouth/Throat: Mucous membranes are moist.       Neck: Supple with no signs of meningismus. Cardiovascular: Regular rate and rhythm. No murmurs, gallops, or rubs. 2+ symmetrical distal pulses are present in all extremities. No JVD. Respiratory: Normal respiratory effort. Lungs are clear to auscultation bilaterally. No wheezes, crackles, or rhonchi.  Gastrointestinal: Obese soft and diffusely tender to palpation worse in the left quadrants, non distended with positive bowel sounds. No rebound or guarding. Genitourinary: No CVA tenderness. Musculoskeletal: Nontender with normal range of motion in all extremities. No edema, cyanosis, or erythema of extremities. Neurologic: Normal speech and language. Face is symmetric. Moving all extremities. No gross focal neurologic deficits are appreciated. Skin: Skin is warm, dry and intact. No rash noted. Psychiatric: Mood and affect are normal. Speech and behavior are normal.  ____________________________________________   LABS (all labs ordered are listed, but only abnormal results are displayed)  Labs Reviewed  COMPREHENSIVE METABOLIC PANEL - Abnormal; Notable for the following  components:      Result Value   Glucose, Bld 156 (*)    BUN 25 (*)    All other components within normal limits  URINALYSIS, COMPLETE (UACMP) WITH MICROSCOPIC - Abnormal; Notable for the following components:   Specific Gravity, Urine >1.030 (*)    Hgb urine dipstick TRACE (*)    Ketones, ur TRACE (*)    Protein, ur >300 (*)    All other components within normal limits  RESPIRATORY PANEL BY RT PCR (FLU A&B, COVID)  LIPASE, BLOOD  CBC   ____________________________________________  EKG  ED ECG REPORT I, Rudene Re, the attending physician, personally viewed and interpreted this ECG.  Normal sinus rhythm rate of 91, normal intervals, normal axis, occasional PVCs, no ST elevations or depressions. ____________________________________________  RADIOLOGY  I have personally reviewed the images performed during this visit and I agree with the Radiologist's read.   Interpretation by Radiologist:  CT ABDOMEN PELVIS W CONTRAST  Result Date: 03/13/2019 CLINICAL DATA:  Diffuse abdominal pain, worse on the left EXAM: CT ABDOMEN AND PELVIS WITH CONTRAST TECHNIQUE: Multidetector CT imaging of the abdomen and pelvis was performed using the standard protocol following bolus administration of intravenous contrast. CONTRAST:  126mL OMNIPAQUE IOHEXOL 300 MG/ML  SOLN COMPARISON:  05/02/2006 FINDINGS: Lower chest:  Coronary atherosclerosis.  Trace pericardial fluid. Hepatobiliary: Bilobed but simple appearing cystic density in the right liver measuring up to 25 mm. Cholecystectomy which accounts for mild intra and extrahepatic bile duct dilatation. No calcified choledocholithiasis. Pancreas: Unremarkable. Spleen: Unremarkable. Adrenals/Urinary Tract: Negative adrenals. No hydronephrosis or stone. Tiny cystic density from the lower pole right kidney. Unremarkable bladder. Stomach/Bowel: Sigmoidectomy with continuity surgery since prior. There are distorted and dilated loops of small bowel in the  central abdomen with a tethered appearance at the level of the central abdomen and low pelvis, attributed to adhesive disease. Some interloop fluid and mesenteric congestion is noted. 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 The colon is relatively decompressed. Vascular/Lymphatic: No acute vascular abnormality. Atherosclerotic calcifications. No mass or adenopathy. Reproductive:No worrisome finding Other: No pneumoperitoneum. Minimal reactive appearing interloop fluid. Musculoskeletal: Left hip arthroplasty and severe right hip osteoarthritis. Generalized lumbar spine degeneration with disc narrowing and spurring. There is mild scoliosis. No acute osseous  finding. IMPRESSION: 1. 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 2.  Aortic Atherosclerosis (ICD10-I70.0).  Coronary atherosclerosis. Electronically Signed   By: Monte Fantasia M.D.   On: 03/13/2019 04:31     ____________________________________________   PROCEDURES  Procedure(s) performed: None Procedures Critical Care performed: yes  CRITICAL CARE Performed by: Rudene Re  ?  Total critical care time: 30 min  Critical care time was exclusive of separately billable procedures and treating other patients.  Critical care was necessary to treat or prevent imminent or life-threatening deterioration.  Critical care was time spent personally by me on the following activities: development of treatment plan with patient and/or surrogate as well as nursing, discussions with consultants, evaluation of patient's response to treatment, examination of patient, obtaining history from patient or surrogate, ordering and performing treatments and interventions, ordering and review of laboratory studies, ordering and review of radiographic studies, pulse oximetry  and re-evaluation of patient's condition.  ____________________________________________   INITIAL IMPRESSION / ASSESSMENT AND PLAN / ED COURSE   68 y.o. female with history of diverticulitis status post partial colectomy, cholecystectomy, appendectomy, thyroid cancer, hypertension who presents for evaluation of abdominal pain, nausea and vomiting since 6 PM.  Patient is well-appearing and in no distress, slightly tachycardic with normal pulse and afebrile, abdomen is nondistended, soft with diffuse tenderness to palpation worse in the left quadrants, positive bowel sounds.  Differential diagnosis including SBO versus diverticulitis versus gastritis versus peptic ulcer disease versus volvulus.  As part of my medical decision making, I reviewed the following data within the [place] abdomen pelvis was reviewed by me consistent with a small bowel obstruction. I have  reviewed the EKG and looked at the rhythm strip in the room which showed NSR. I have reviewed patient's previous medical records and PMH. Labs were reviewed by me and showed no leukocytosis, no significant electrolyte derangements, mild hyperglycemia with no evidence of DKA, and UA negative for UTI. IV morphine and Zofran given were given.  Discussed the case with Dr. Celine Ahr from surgery who recommended admission to the hospitalist service.  NG tube was placed.  Will give IV fluids for hydration.  Discussed with Dr. Damita Dunnings who will admit patient.      _____________________________________________ Please note:  Patient was evaluated in Emergency Department today for the symptoms described in the history of present illness. Patient was evaluated in the context of the global COVID-19 pandemic, which necessitated consideration that the patient might be at risk for infection with the SARS-CoV-2 virus that causes COVID-19. Institutional protocols and algorithms that pertain to the evaluation of patients at risk for COVID-19  are in a state of rapid change based on information released by regulatory bodies including the CDC and federal and state organizations. These policies and algorithms were followed during the patient's care in the ED.  Some ED evaluations and interventions may be delayed as a result of limited staffing during the pandemic.   ____________________________________________   FINAL CLINICAL IMPRESSION(S) / ED DIAGNOSES   Final diagnoses:  SBO (small bowel obstruction) ([place])      NEW MEDICATIONS STARTED DURING THIS VISIT:  ED Discharge Orders    None       Note:  This document was prepared using Dragon voice recognition software and may include unintentional dictation errors.    Alfred Levins, [place], MD 03/13/19 [phone]

## 2019-03-13 NOTE — ED Notes (Signed)
Unable to void at this time.

## 2019-03-13 NOTE — Progress Notes (Signed)
Progress Note    Pamela Dodson  [id number] DOB: Nov 01, 1950  DOA: 03/13/2019 PCP: Hubbard Hartshorn, FNP      Brief Narrative:    Medical records reviewed and are as summarized below:  Pamela Dodson is an 69 y.o. female with medical history significant for surgical hypothyroidism, stage II CKD, hypertension and history of abdominal surgery to include bowel resection secondary to diverticulitis, appendectomy and cholecystectomy who presents to the emergency room with crampy mid abdominal pain that started on 6 PM the day prior to arrival.  She describes the pain is in the periumbilical and upper abdomen that waxed and wane and was of moderate intensity at baseline but then would crescendo to a 10/10.  It was worse with lying flat and she got some relief from walking around sitting upright.  A few hours later she developed vomiting and had 3 episodes of vomiting of stomach contents no blood or bile.  She denied fever.  She stated that in the couple weeks prior to admission she had been feeling generalized malaise and would be intermittently short of breath.   She was found to have small bowel obstruction.        Assessment/Plan:   Principal Problem:   SBO (small bowel obstruction) (HCC) Active Problems:   Hypertension goal BP (blood pressure) < 150/90   Post-surgical hypothyroidism   Chronic kidney disease, stage II (mild)   History of resection of large bowel   Small bowel obstruction (HCC)   Small bowel obstruction with history of sigmoid colectomy, appendectomy and cholecystectomy: Continue n.p.o. status.  She has an NG tube to low intermittent wall suction for gastric decompression.  Analgesics as needed for pain.  Antiemetics as needed for nausea/vomiting.  IV fluids for hydration.  Monitor electrolytes.  Follow-up with general surgeon for further recommendations.  Hypothyroidism: She is on IV Synthroid  Hypertension: IV antihypertensives as needed for severe  hypertension.  Body mass index is 33.66 kg/m.  (Obesity)   Family Communication/Anticipated D/C date and plan/Code Status   DVT prophylaxis: Lovenox Code Status: Full code Family Communication: Plan discussed with patient Disposition Plan: Patient is from home.  Plan to discharge home when small bowel obstruction resolves and she is cleared by surgeon for discharge.      Subjective:   C/o abdominal pain that moved up to area under left breast and left shoulder but pain is better now.  Objective:    Vitals:   03/13/19 0830 03/13/19 0900 03/13/19 0930 03/13/19 1000  BP: 129/70 125/79 119/66 122/65  Pulse: 84 89 77 74  Resp: 20 19 11 12   Temp:      TempSrc:      SpO2: 98% 95% 96% 93%  Weight:      Height:        Intake/Output Summary (Last 24 hours) at 03/13/2019 1057 Last data filed at 03/13/2019 0715 Gross per 24 hour  Intake 510.45 ml  Output 450 ml  Net 60.45 ml   Filed Weights   03/13/19 0139  Weight: 86.2 kg    Exam:  GEN: NAD SKIN: No rash EYES: EOMI ENT: MMM, NG tube to low wall suction with light greenish fluid in canister CV: RRR PULM: CTA B ABD: soft, obese, mild left lower quadrant tenderness, no rebound tenderness or guarding,+BS CNS: AAO x 3, non focal EXT: No edema or tenderness   Data Reviewed:   I have personally reviewed following labs and imaging studies:  Labs: Labs  show the following:   Basic Metabolic Panel: Recent Labs  Lab 03/09/19 0950 03/13/19 0141  NA 143 139  K 4.1 4.5  CL 105 100  CO2 28 31  GLUCOSE 105* 156*  BUN 19 25*  CREATININE 0.59* 0.76  CALCIUM 9.3 9.7   GFR Estimated Creatinine Clearance: 68.1 mL/min (by C-G formula based on SCr of 0.76 mg/dL). Liver Function Tests: Recent Labs  Lab 03/09/19 0950 03/13/19 0141  AST 20 24  ALT 19 22  ALKPHOS  --  74  BILITOT 0.5 0.9  PROT 6.9 7.9  ALBUMIN  --  4.5   Recent Labs  Lab 03/13/19 0141  LIPASE 27   No results for input(s): AMMONIA in the last  168 hours. Coagulation profile No results for input(s): INR, PROTIME in the last 168 hours.  CBC: Recent Labs  Lab 03/13/19 0141  WBC 10.3  HGB 14.3  HCT 43.5  MCV 93.8  PLT 277   Cardiac Enzymes: No results for input(s): CKTOTAL, CKMB, CKMBINDEX, TROPONINI in the last 168 hours. BNP (last 3 results) No results for input(s): PROBNP in the last 8760 hours. CBG: No results for input(s): GLUCAP in the last 168 hours. D-Dimer: No results for input(s): DDIMER in the last 72 hours. Hgb A1c: No results for input(s): HGBA1C in the last 72 hours. Lipid Profile: No results for input(s): CHOL, HDL, LDLCALC, TRIG, CHOLHDL, LDLDIRECT in the last 72 hours. Thyroid function studies: No results for input(s): TSH, T4TOTAL, T3FREE, THYROIDAB in the last 72 hours.  Invalid input(s): FREET3 Anemia work up: No results for input(s): VITAMINB12, FOLATE, FERRITIN, TIBC, IRON, RETICCTPCT in the last 72 hours. Sepsis Labs: Recent Labs  Lab 03/13/19 0141  WBC 10.3    Microbiology Recent Results (from the past 240 hour(s))  Respiratory Panel by RT PCR (Flu A&B, Covid) - Nasopharyngeal Swab     Status: None   Collection Time: 03/13/19  5:43 AM   Specimen: Nasopharyngeal Swab  Result Value Ref Range Status   SARS Coronavirus 2 by RT PCR NEGATIVE NEGATIVE Final    Comment: (NOTE) SARS-CoV-2 target nucleic acids are NOT DETECTED. The SARS-CoV-2 RNA is generally detectable in upper respiratoy specimens during the acute phase of infection. The lowest concentration of SARS-CoV-2 viral copies this assay can detect is 131 copies/mL. A negative result does not preclude SARS-Cov-2 infection and should not be used as the sole basis for treatment or other patient management decisions. A negative result may occur with  improper specimen collection/handling, submission of specimen other than nasopharyngeal swab, presence of viral mutation(s) within the areas targeted by this assay, and inadequate number  of viral copies (<131 copies/mL). A negative result must be combined with clinical observations, patient history, and epidemiological information. The expected result is Negative. Fact Sheet for Patients:  PinkCheek.be Fact Sheet for Healthcare Providers:  GravelBags.it This test is not yet ap proved or cleared by the [place] FDA and  has been authorized for detection and/or diagnosis of SARS-CoV-2 by FDA under an Emergency Use Authorization (EUA). This EUA will remain  in effect (meaning this test can be used) for the duration of the COVID-19 declaration under Section 564(b)(1) of the Act, 21 U.S.C. section 360bbb-3(b)(1), unless the authorization is terminated or revoked sooner.    Influenza A by PCR NEGATIVE NEGATIVE Final   Influenza B by PCR NEGATIVE NEGATIVE Final    Comment: (NOTE) The Xpert Xpress SARS-CoV-2/FLU/RSV assay is intended as an aid in  the diagnosis of influenza  from Nasopharyngeal swab specimens and  should not be used as a sole basis for treatment. Nasal washings and  aspirates are unacceptable for Xpert Xpress SARS-CoV-2/FLU/RSV  testing. Fact Sheet for Patients: PinkCheek.be Fact Sheet for Healthcare Providers: GravelBags.it This test is not yet approved or cleared by the [place] FDA and  has been authorized for detection and/or diagnosis of SARS-CoV-2 by  FDA under an Emergency Use Authorization (EUA). This EUA will remain  in effect (meaning this test can be used) for the duration of the  Covid-19 declaration under Section 564(b)(1) of the Act, 21  U.S.C. section 360bbb-3(b)(1), unless the authorization is  terminated or revoked. Performed at  [facility], [address] [place], [place] [address]     Procedures and diagnostic studies:  CT ABDOMEN PELVIS W CONTRAST  Result Date: 03/13/2019 CLINICAL DATA:  Diffuse  abdominal pain, worse on the left EXAM: CT ABDOMEN AND PELVIS WITH CONTRAST TECHNIQUE: Multidetector CT imaging of the abdomen and pelvis was performed using the standard protocol following bolus administration of intravenous contrast. CONTRAST:  160mL OMNIPAQUE IOHEXOL 300 MG/ML  SOLN COMPARISON:  05/02/2006 FINDINGS: Lower chest:  Coronary atherosclerosis.  Trace pericardial fluid. Hepatobiliary: Bilobed but simple appearing cystic density in the right liver measuring up to 25 mm. Cholecystectomy which accounts for mild intra and extrahepatic bile duct dilatation. No calcified choledocholithiasis. Pancreas: Unremarkable. Spleen: Unremarkable. Adrenals/Urinary Tract: Negative adrenals. No hydronephrosis or stone. Tiny cystic density from the lower pole right kidney. Unremarkable bladder. Stomach/Bowel: Sigmoidectomy with continuity surgery since prior. There are distorted and dilated loops of small bowel in the central abdomen with a tethered appearance at the level of the central abdomen and low pelvis, attributed to adhesive disease. Some interloop fluid and mesenteric congestion is noted. 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 The colon is relatively decompressed. Vascular/Lymphatic: No acute vascular abnormality. Atherosclerotic calcifications. No mass or adenopathy. Reproductive:No worrisome finding Other: No pneumoperitoneum. Minimal reactive appearing interloop fluid. Musculoskeletal: Left hip arthroplasty and severe right hip osteoarthritis. Generalized lumbar spine degeneration with disc narrowing and spurring. There is mild scoliosis. No acute osseous finding. IMPRESSION: 1. 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 2.  Aortic Atherosclerosis (ICD10-I70.0).  Coronary atherosclerosis. Electronically Signed   By:  Monte Fantasia M.D.   On: 03/13/2019 04:31   DG Abd Portable 1 View  Result Date: 03/13/2019 CLINICAL DATA:  NG tube placement. EXAM: PORTABLE ABDOMEN - 1 VIEW COMPARISON:  CT scan 03/13/2019 FINDINGS: The NG tube tip is in the body region of the stomach. The proximal port is in the fundal area. The lungs are clear. Stable bowel gas pattern. IMPRESSION: NG tube in good position. Electronically Signed   By: Marijo Sanes M.D.   On: 03/13/2019 06:43    Medications:   . enoxaparin (LOVENOX) injection  40 mg Subcutaneous Q24H  . levothyroxine  88 mcg Intravenous Daily   Continuous Infusions: . lactated ringers 125 mL/hr at 03/13/19 1026     LOS: 0 days   Shamaya Kauer  [facility]     03/13/2019, 10:57 AM

## 2019-03-13 NOTE — H&P (Signed)
History and Physical    Pamela Dodson [id number] DOB: 1950/01/16 DOA: 03/13/2019  PCP: Hubbard Hartshorn, FNP   Patient coming from: home I have personally briefly reviewed patient's old medical records in [place]  Chief Complaint: abdominal pain and vomiting  HPI: Pamela Dodson is a 69 y.o. female with medical history significant for surgical hypothyroidism, stage II CKD, pretension and history of abdominal surgery to include bowel resection secondary to diverticulitis, appendectomy and cholecystectomy who presents to the emergency room with crampy mid abdominal pain that started on 6 PM the day prior to arrival.  She describes the pain is in the periumbilical and upper abdomen that waxed and wane and was of moderate intensity at baseline but then would crescendo to a 10/10.  It was worse with lying flat and she got some relief from walking around sitting upright.  A few hours later she developed vomiting and had 3 episodes of vomiting of stomach contents no blood or bile.  She denied fever.  He states that in the couple weeks prior to tonight she had been feeling generalized malaise and would be intermittently short of breath but denies chest pain, cough ED Course: On arrival in the emergency room she was tachycardic at 105 but afebrile at 98.4.  Blood pressure slightly elevated at 156/101.  Blood work was for the most part unremarkable with white cell count of 10,300 hemoglobin 14.3, lipase 27 and normal liver enzymes.  CT abdomen and pelvis showed small bowel obstruction with transition point at the level of tethered bowel loops closely neighboring prior sigmoidectomy and colostomy site likely adhesive disease.  Emergency room provider spoke with surgeon Dr. Celine Ahr who recommended medical admission trial of conservative management  Review of Systems: As per HPI otherwise 10 point review of systems negative.    Past Medical History:  Diagnosis Date  . Arthritis   . Blood  transfusion   . Cancer (HCC)    hx thyroid cancer  . Full code status 11/02/2016  . GERD (gastroesophageal reflux disease)   . 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 Marlou Starks, II in [place]  . H/O malignant neoplasm of thyroid 08/07/2014   S/P resection x2   . History of knee replacement 08/07/2014  . Hypertension   . Hypothyroidism   . Neuromuscular disorder (HCC)    numbnes lower legs s/p knee replacements  . PONV (postoperative nausea and vomiting)    usually needs zofran/antiemetic prior to surgery  . Shingles rash 01/22/2015  . Thyroid cancer ([place])   . Wears contact lenses     Past Surgical History:  Procedure Laterality Date  . APPENDECTOMY    . CARPAL TUNNEL RELEASE Left 2016  . CESAREAN SECTION    . CHOLECYSTECTOMY    . COLON SURGERY     diverticulitic mass removed  . COLONOSCOPY WITH PROPOFOL N/A 08/15/2014   Procedure: COLONOSCOPY WITH PROPOFOL;  Surgeon: Lucilla Lame, MD;  Location: [facility];  Service: Endoscopy;  Laterality: N/A;  . GANGLION CYST EXCISION     L wrist  . GLOMUS TUMOR EXCISION Left 12/12/2017  . JOINT REPLACEMENT    . knee arthroscopy    . THYROIDECTOMY  2009  . TOTAL HIP ARTHROPLASTY  12/16/2010   Procedure: TOTAL HIP ARTHROPLASTY;  Surgeon: Kerin Salen;  Location: [facility];  Service: Orthopedics;  Laterality: Left;  Depuy  . TOTAL KNEE ARTHROPLASTY  2009     reports that she has never smoked. She has never  used smokeless tobacco. She reports current alcohol use of about 1.0 standard drinks of alcohol per week. She reports that she does not use drugs.  No Known Allergies  Family History  Problem Relation Age of Onset  . Cancer Mother        lung  . Myelodysplastic syndrome Mother   . Hypothyroidism Mother   . Hypertension Brother   . Stroke Brother   . Atrial fibrillation Brother   . Birth defects Son        congenital genetic abnormality  . Dementia Maternal Grandfather   . Heart disease Paternal Grandmother        heart disease  in her 106's  . Stroke Paternal Grandmother   . Heart attack Paternal Grandfather   . Heart disease Paternal Aunt   . Heart attack Paternal Aunt   . Stroke Paternal Aunt   . Atrial fibrillation Paternal Uncle      Prior to Admission medications   Medication Sig Start Date End Date Taking? Authorizing Provider  amLODipine-benazepril (LOTREL) 10-40 MG capsule Take 1 capsule by mouth at bedtime.    Yes [provider]  Ascorbic Acid (VITAMIN C) 100 MG tablet Take 100 mg by mouth daily.   Yes [provider]  atorvastatin (LIPITOR) 80 MG tablet Take 1 tablet (80 mg total) by mouth daily. Patient taking differently: Take 80 mg by mouth at bedtime.  07/24/18  Yes Poulose, Bethel Born, NP  Cholecalciferol 1000 UNITS capsule Take 1 capsule by mouth daily.   Yes [provider]  levothyroxine (SYNTHROID) 175 MCG tablet Take 1 tablet (175 mcg total) by mouth daily before breakfast. 07/24/18  Yes Poulose, Bethel Born, NP  loratadine (CLARITIN) 10 MG tablet Take 10 mg by mouth daily.   Yes [provider]  Multiple Vitamin (MULTIVITAMIN) tablet Take 1 tablet by mouth daily.   Yes [provider]  Omega-3 Fatty Acids (FISH OIL) 1000 MG CPDR Take 1 capsule by mouth 2 (two) times daily. 11/02/16  Yes Arnetha Courser, MD  venlafaxine XR (EFFEXOR-XR) 37.5 MG 24 hr capsule Take 1 capsule (37.5 mg total) by mouth daily with breakfast. 07/24/18  Yes Fredderick Severance, NP    Physical Exam: Vitals:   03/13/19 0445 03/13/19 0500 03/13/19 0530 03/13/19 0545  BP:  (!) 143/72 (!) 146/85   Pulse: 89 93 91 94  Resp: 11 10 19  (!) 21  Temp:      TempSrc:      SpO2: 97% 97% 96% 95%  Weight:      Height:         Vitals:   03/13/19 0445 03/13/19 0500 03/13/19 0530 03/13/19 0545  BP:  (!) 143/72 (!) 146/85   Pulse: 89 93 91 94  Resp: 11 10 19  (!) 21  Temp:      TempSrc:      SpO2: 97% 97% 96% 95%  Weight:      Height:        Constitutional: Alert and awake,  oriented x3, not in any acute distress. Eyes: PERLA, EOMI, irises appear normal, anicteric sclera,  ENMT: external ears and nose appear normal, normal hearing             Lips appears normal, oropharynx mucosa, tongue, posterior pharynx appear normal  Neck: neck appears normal, no masses, normal ROM, no thyromegaly, no JVD  CVS: S1-S2 clear, no murmur rubs or gallops,  , no carotid bruits, pedal pulses palpable, No LE edema Respiratory:  clear to auscultation bilaterally, no wheezing, rales or rhonchi. Respiratory effort normal. No accessory muscle use.  Abdomen: soft nontender, nondistended, hyperactive bowel sounds, no hepatosplenomegaly, no hernias Musculoskeletal: : no cyanosis, clubbing , no contractures or atrophy Neuro: Cranial nerves II-XII intact, sensation, reflexes normal, strength Psych: judgement and insight appear normal, stable mood and affect,  Skin: no rashes or lesions or ulcers, no induration or nodules   Labs on Admission: I have personally reviewed following labs and imaging studies  CBC: Recent Labs  Lab 03/13/19 0141  WBC 10.3  HGB 14.3  HCT 43.5  MCV 93.8  PLT [id number]   Basic Metabolic Panel: Recent Labs  Lab 03/09/19 0950 03/13/19 0141  NA 143 139  K 4.1 4.5  CL 105 100  CO2 28 31  GLUCOSE 105* 156*  BUN 19 25*  CREATININE 0.59* 0.76  CALCIUM 9.3 9.7   GFR: Estimated Creatinine Clearance: 68.1 mL/min (by C-G formula based on SCr of 0.76 mg/dL). Liver Function Tests: Recent Labs  Lab 03/09/19 0950 03/13/19 0141  AST 20 24  ALT 19 22  ALKPHOS  --  74  BILITOT 0.5 0.9  PROT 6.9 7.9  ALBUMIN  --  4.5   Recent Labs  Lab 03/13/19 0141  LIPASE 27   No results for input(s): AMMONIA in the last 168 hours. Coagulation Profile: No results for input(s): INR, PROTIME in the last 168 hours. Cardiac Enzymes: No results for input(s): CKTOTAL, CKMB, CKMBINDEX, TROPONINI in the last 168 hours. BNP (last 3 results) No results for input(s): PROBNP in  the last 8760 hours. HbA1C: No results for input(s): HGBA1C in the last 72 hours. CBG: No results for input(s): GLUCAP in the last 168 hours. Lipid Profile: No results for input(s): CHOL, HDL, LDLCALC, TRIG, CHOLHDL, LDLDIRECT in the last 72 hours. Thyroid Function Tests: No results for input(s): TSH, T4TOTAL, FREET4, T3FREE, THYROIDAB in the last 72 hours. Anemia Panel: No results for input(s): VITAMINB12, FOLATE, FERRITIN, TIBC, IRON, RETICCTPCT in the last 72 hours. Urine analysis:    Component Value Date/Time   COLORURINE YELLOW 03/13/2019 0141   APPEARANCEUR CLEAR 03/13/2019 0141   LABSPEC >1.030 (H) 03/13/2019 0141   PHURINE 5.5 03/13/2019 0141   GLUCOSEU NEGATIVE 03/13/2019 0141   HGBUR TRACE (A) 03/13/2019 0141   BILIRUBINUR NEGATIVE 03/13/2019 0141   KETONESUR TRACE (A) 03/13/2019 0141   PROTEINUR >300 (A) 03/13/2019 0141   NITRITE NEGATIVE 03/13/2019 0141   LEUKOCYTESUR NEGATIVE 03/13/2019 0141    Radiological Exams on Admission: CT ABDOMEN PELVIS W CONTRAST  Result Date: 03/13/2019 CLINICAL DATA:  Diffuse abdominal pain, worse on the left EXAM: CT ABDOMEN AND PELVIS WITH CONTRAST TECHNIQUE: Multidetector CT imaging of the abdomen and pelvis was performed using the standard protocol following bolus administration of intravenous contrast. CONTRAST:  183mL OMNIPAQUE IOHEXOL 300 MG/ML  SOLN COMPARISON:  05/02/2006 FINDINGS: Lower chest:  Coronary atherosclerosis.  Trace pericardial fluid. Hepatobiliary: Bilobed but simple appearing cystic density in the right liver measuring up to 25 mm. Cholecystectomy which accounts for mild intra and extrahepatic bile duct dilatation. No calcified choledocholithiasis. Pancreas: Unremarkable. Spleen: Unremarkable. Adrenals/Urinary Tract: Negative adrenals. No hydronephrosis or stone. Tiny cystic density from the lower pole right kidney. Unremarkable bladder. Stomach/Bowel: Sigmoidectomy with continuity surgery since prior. There are distorted  and dilated loops of small bowel in the central abdomen with a tethered appearance at the level of the central abdomen and low pelvis, attributed to adhesive disease. Some interloop fluid and mesenteric congestion is noted.  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 The colon is relatively decompressed. Vascular/Lymphatic: No acute vascular abnormality. Atherosclerotic calcifications. No mass or adenopathy. Reproductive:No worrisome finding Other: No pneumoperitoneum. Minimal reactive appearing interloop fluid. Musculoskeletal: Left hip arthroplasty and severe right hip osteoarthritis. Generalized lumbar spine degeneration with disc narrowing and spurring. There is mild scoliosis. No acute osseous finding. IMPRESSION: 1. 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 2.  Aortic Atherosclerosis (ICD10-I70.0).  Coronary atherosclerosis. Electronically Signed   By: Monte Fantasia M.D.   On: 03/13/2019 04:31    EKG: Independently reviewed.   Assessment/Plan Principal Problem:   SBO (small bowel obstruction) (HCC) in the setting of history of sigmoid colectomy, appendectomy and cholecystectomy  -Likely secondary to adhesions as shown on CT abdomen and pelvis -NG tube was placed in the ER to continue to low intermittent suction pending further surgical recommendations -IV hydration with LR at 125 mils per hour -IV antiemetics and narcotic pain meds -Surgical consult  Hpertension goal BP (blood pressure) < 150/90 --Patient will be n.p.o. so labetalol 10 mg IV as needed blood pressure over 160/90    Post-surgical hypothyroidism -Pharmacy consult for IV levothyroxine dosing.  Patient on 175 -History of thyroid cancer, per documentation in chart    Chronic kidney disease, stage II (mild) -At baseline      DVT  prophylaxis: Lovenox  Code Status: full code  Family Communication:  none  Disposition Plan: Back to previous home environment Consults called: None, Dr. Celine Ahr Status:obs    Athena Masse MD [facility]     03/13/2019, 6:20 AM

## 2019-03-13 NOTE — ED Notes (Signed)
Patient oxygen saturation dropped after being given morphine and resting.  Placed on 2L [place] and oxygen up to 98%.

## 2019-03-13 NOTE — ED Notes (Signed)
Pt transported to CT ?

## 2019-03-13 NOTE — ED Notes (Signed)
Pt is complaining of sharp pain in upper back behind left scapula, that was also radiating under left breast.  pain is still an 8/10 after giving 2 of morphine. she states it started after tube was placed and pt was repositioned for xray. Unsure if this could be related to the NG tube. suction is set to intermi at 80. she reports that only thing to help relieve pain is to apply pressure to that specific area of the back, repositioning in bed doesnt help but reports when standing pain decreases. RN attempted repositioning multiple times, and offered to assist pt into recliner beside bed, but pt declined, states Im okay right now I finally have pressure against it and can tolerate pain at this time. Pt to notify RN if she would like to be repositioned. MD notified and reports he will be in to see pt shortly.

## 2019-03-14 ENCOUNTER — Inpatient Hospital Stay: Payer: Medicare Other

## 2019-03-14 LAB — COMPREHENSIVE METABOLIC PANEL
ALT: 18 U/L (ref 0–44)
AST: 17 U/L (ref 15–41)
Albumin: 3.4 g/dL — ABNORMAL LOW (ref 3.5–5.0)
Alkaline Phosphatase: 58 U/L (ref 38–126)
Anion gap: 7 (ref 5–15)
BUN: 13 mg/dL (ref 8–23)
CO2: 31 mmol/L (ref 22–32)
Calcium: 8.3 mg/dL — ABNORMAL LOW (ref 8.9–10.3)
Chloride: 103 mmol/L (ref 98–111)
Creatinine, Ser: 0.48 mg/dL (ref 0.44–1.00)
GFR calc Af Amer: >60 mL/min (ref >60)
GFR calc non Af Amer: >60 mL/min (ref >60)
Glucose, Bld: 134 mg/dL — ABNORMAL HIGH (ref 70–99)
Potassium: 3.5 mmol/L (ref 3.5–5.1)
Sodium: 141 mmol/L (ref 135–145)
Total Bilirubin: 0.7 mg/dL (ref 0.3–1.2)
Total Protein: 6.4 g/dL — ABNORMAL LOW (ref 6.5–8.1)

## 2019-03-14 LAB — CBC
HCT: 36.1 % (ref 36.0–46.0)
Hemoglobin: 11.7 g/dL — ABNORMAL LOW (ref 12.0–15.0)
MCH: 30.9 pg (ref 26.0–34.0)
MCHC: 32.4 g/dL (ref 30.0–36.0)
MCV: 95.3 fL (ref 80.0–100.0)
Platelets: 206 10*3/uL (ref 150–400)
RBC: 3.79 MIL/uL — ABNORMAL LOW (ref 3.87–5.11)
RDW: 13.1 % (ref 11.5–15.5)
WBC: 6.4 10*3/uL (ref 4.0–10.5)
nRBC: 0 % (ref 0.0–0.2)

## 2019-03-14 LAB — HIV ANTIBODY (ROUTINE TESTING W REFLEX): HIV Screen 4th Generation wRfx: NONREACTIVE

## 2019-03-14 MED ORDER — DIATRIZOATE MEGLUMINE & SODIUM 66-10 % PO SOLN
90.0000 mL | Freq: Once | ORAL | Status: AC
  Administered 2019-03-14: 90 mL via NASOGASTRIC

## 2019-03-14 NOTE — Progress Notes (Addendum)
[facility] SURGICAL PROGRESS NOTE (cpt [phone])  Hospital Day(s): 1.   Interval History: Patient seen and examined, no acute events or new complaints overnight. Patient reports she continues to have intermittent abdominal and left shoulder pain.The pain is improved with laying on her left side. She did note some nausea and an episode of emesis despite NGT placement. NGT with 550 ccs out overnight. She reported only one episode of flatus yesterday. Labs are stable.    Review of Systems:  Constitutional: denies fever, chills  HEENT: denies cough or congestion  Respiratory: denies any shortness of breath  Cardiovascular: denies chest pain or palpitations  Gastrointestinal: + abdominal pain, + N/V, denied diarrhea/and bowel function as per interval history Genitourinary: denies burning with urination or urinary frequency   Vital signs in last 24 hours: [min-max] current  Temp:  [97.7 F (36.5 C)-98.6 F (37 C)] 97.7 F (36.5 C) (03/10 0442) Pulse Rate:  [74-91] 76 (03/10 0442) Resp:  [11-22] 18 (03/10 0442) BP: (112-144)/(53-86) 130/74 (03/10 0442) SpO2:  [89 %-98 %] 89 % (03/10 0442)     Height: 5\' 3"  (160 cm) Weight: 86.2 kg BMI (Calculated): 33.67   Intake/Output last 2 shifts:  03/09 0701 - 03/10 0700 In: 2512.7 [I.V.:2002.3; IV Piggyback:510.5] Out: [id number] [Urine:1000; Emesis/NG output:550]   Physical Exam:  Constitutional: alert, cooperative and no distress  HENT: normocephalic without obvious abnormality, NGT in place  Respiratory: breathing non-labored at rest  Cardiovascular: regular rate and sinus rhythm  Gastrointestinal: soft, non-tender, and non-distended, no rebound/guarding Musculoskeletal: no edema or wounds, motor and sensation grossly intact, NT    Labs:  CBC Latest Ref Rng & Units 03/14/2019 03/13/2019 01/06/2016  WBC 4.0 - 10.5 K/uL 6.4 10.3 5.4  Hemoglobin 12.0 - 15.0 g/dL 11.7(L) 14.3 13.8  Hematocrit 36.0 - 46.0 % 36.1 43.5 41.9  Platelets 150  - 400 K/uL 206 277 279   CMP Latest Ref Rng & Units 03/14/2019 03/13/2019 03/09/2019  Glucose 70 - 99 mg/dL 134(H) 156(H) 105(H)  BUN 8 - 23 mg/dL 13 25(H) 19  Creatinine 0.44 - 1.00 mg/dL 0.48 0.76 0.59(L)  Sodium 135 - 145 mmol/L 141 139 143  Potassium 3.5 - 5.1 mmol/L 3.5 4.5 4.1  Chloride 98 - 111 mmol/L 103 100 105  CO2 22 - 32 mmol/L 31 31 28   Calcium 8.9 - 10.3 mg/dL 8.3(L) 9.7 9.3  Total Protein 6.5 - 8.1 g/dL 6.4(L) 7.9 6.9  Total Bilirubin 0.3 - 1.2 mg/dL 0.7 0.9 0.5  Alkaline Phos 38 - 126 U/L 58 74 -  AST 15 - 41 U/L 17 24 20   ALT 0 - 44 U/L 18 22 19     Imaging studies: No new pertinent imaging studies   Assessment/Plan: (ICD-10's: K13.609) 69 y.o. 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   - Continue NGT decompression; monitor and record output   - Will get serial KUBs with gastrografin to reassess for improvement   - NPO + IVF  - IV PPI for PUD prophylaxis   - pain control prn (minimize narcotics); antiemetics prn             - monitor abdominal examination; on-going bowel function             - mobilization encouraged             - Further management per primary service    All of the above findings and recommendations were discussed with the patient, and the  medical team, and all of patient's questions were answered to her expressed satisfaction.  -- Edison Simon, PA-C [facility] 03/14/2019, 7:37 AM [phone] M-F: 7am - 4pm  I saw and evaluated the patient.  I agree with the above documentation, exam, and plan, which I have edited where appropriate. Fredirick Maudlin  8:36 PM

## 2019-03-14 NOTE — Progress Notes (Signed)
Gastrografin administered at 12:20 pm.

## 2019-03-14 NOTE — Progress Notes (Signed)
PROGRESS NOTE    Pamela Dodson  [id number] DOB: 1950-02-23 DOA: 03/13/2019 PCP: Hubbard Hartshorn, FNP   Brief Narrative:  Pamela Dodson is a 69 y.o. female with medical history significant for surgical hypothyroidism, stage II CKD, pretension and history of abdominal surgery to include bowel resection secondary to diverticulitis, appendectomy and cholecystectomy who presents to the emergency room with crampy mid abdominal pain that started on 6 PM the day prior to arrival, associated with nausea and vomiting.  Labs positive for leukocytosis and CT abdomen concerning for SBO.  NG was placed and surgery was consulted.  Subjective: Patient continued to experience some nausea and 1 vomitus despite with NG tube overnight.  Complaining of intermittent abdominal and pain below her left shoulder blade.  Pain improves by lying on left side.  No pain when seen today.  Assessment & Plan:   Principal Problem:   SBO (small bowel obstruction) (HCC) Active Problems:   Hypertension goal BP (blood pressure) < 150/90   Post-surgical hypothyroidism   Chronic kidney disease, stage II (mild)   History of resection of large bowel   Small bowel obstruction (HCC)  SBO (small bowel obstruction) ([place]).  Patient is high risk due to her prior history of multiple abdominal surgeries which include sigmoid colostomy, appendectomy, C-section and cholecystectomy. Continue to remain symptomatic with more than 500 cc of secretions with NG tube.  Repeat KUB this morning was with nonobstructive pattern. Surgery is following-appreciate their recommendations. Surgery is planning for Gastrografin studies. -Continue IV fluid. -Continue NG tube for now. -Continue IV antiemetics.  Hpertension goal BP (blood pressure) < 150/90. Blood pressure remained within goal. -Continue as needed labetalol 10 mg IV. -We will restart home meds once able to tolerate p.o.   Post-surgical hypothyroidism.  History of thyroid cancer.   Patient was on 175 MCG of Synthroid at home.  Giving IV levothyroxine 88 MCG with the help of pharmacy.  Chronic kidney disease, stage II (mild) -At baseline.  Objective: Vitals:   03/13/19 1637 03/13/19 1836 03/13/19 1942 03/14/19 0442  BP: (!) 141/86 125/66 (!) 144/70 130/74  Pulse: 91 79 79 76  Resp: (!) 22 18 20 18   Temp:  98.4 F (36.9 C) 98.6 F (37 C) 97.7 F (36.5 C)  TempSrc:  Oral Oral   SpO2: 97% 96% 97% (!) 89%  Weight:      Height:        Intake/Output Summary (Last 24 hours) at 03/14/2019 0733 Last data filed at 03/14/2019 0600 Gross per 24 hour  Intake 2002.29 ml  Output 1550 ml  Net 452.29 ml   Filed Weights   03/13/19 0139  Weight: 86.2 kg    Examination:  General exam: Appears calm and comfortable.  NG tube in place. Respiratory system: Clear to auscultation. Respiratory effort normal. Cardiovascular system: S1 & S2 heard, RRR. No JVD, murmurs, rubs, gallops or clicks. Gastrointestinal system: Soft, nontender, nondistended, bowel sounds positive. Central nervous system: Alert and oriented. No focal neurological deficits.Symmetric 5 x 5 power. Extremities: No edema, no cyanosis, pulses intact and symmetrical. Skin: No rashes, lesions or ulcers Psychiatry: Judgement and insight appear normal. Mood & affect appropriate.    DVT prophylaxis: Lovenox Code Status: Full Family Communication: Husband was updated on phone. Disposition Plan: Pending improvement.  She will go back home in 2 to 3 days if normal Gastrografin studies and she was able to tolerate p.o.  Consultants:   General surgery.  Procedures:  Antimicrobials:   Data Reviewed:  I have personally reviewed following labs and imaging studies  CBC: Recent Labs  Lab 03/13/19 0141 03/14/19 0436  WBC 10.3 6.4  HGB 14.3 11.7*  HCT 43.5 36.1  MCV 93.8 95.3  PLT 277 [id number]   Basic Metabolic Panel: Recent Labs  Lab 03/09/19 0950 03/13/19 0141 03/14/19 0436  NA 143 139 141  K 4.1 4.5  3.5  CL 105 100 103  CO2 28 31 31   GLUCOSE 105* 156* 134*  BUN 19 25* 13  CREATININE 0.59* 0.76 0.48  CALCIUM 9.3 9.7 8.3*   GFR: Estimated Creatinine Clearance: 68.1 mL/min (by C-G formula based on SCr of 0.48 mg/dL). Liver Function Tests: Recent Labs  Lab 03/09/19 0950 03/13/19 0141 03/14/19 0436  AST 20 24 17   ALT 19 22 18   ALKPHOS  --  74 58  BILITOT 0.5 0.9 0.7  PROT 6.9 7.9 6.4*  ALBUMIN  --  4.5 3.4*   Recent Labs  Lab 03/13/19 0141  LIPASE 27   No results for input(s): AMMONIA in the last 168 hours. Coagulation Profile: No results for input(s): INR, PROTIME in the last 168 hours. Cardiac Enzymes: No results for input(s): CKTOTAL, CKMB, CKMBINDEX, TROPONINI in the last 168 hours. BNP (last 3 results) No results for input(s): PROBNP in the last 8760 hours. HbA1C: No results for input(s): HGBA1C in the last 72 hours. CBG: No results for input(s): GLUCAP in the last 168 hours. Lipid Profile: No results for input(s): CHOL, HDL, LDLCALC, TRIG, CHOLHDL, LDLDIRECT in the last 72 hours. Thyroid Function Tests: No results for input(s): TSH, T4TOTAL, FREET4, T3FREE, THYROIDAB in the last 72 hours. Anemia Panel: No results for input(s): VITAMINB12, FOLATE, FERRITIN, TIBC, IRON, RETICCTPCT in the last 72 hours. Sepsis Labs: No results for input(s): PROCALCITON, LATICACIDVEN in the last 168 hours.  Recent Results (from the past 240 hour(s))  Respiratory Panel by RT PCR (Flu A&B, Covid) - Nasopharyngeal Swab     Status: None   Collection Time: 03/13/19  5:43 AM   Specimen: Nasopharyngeal Swab  Result Value Ref Range Status   SARS Coronavirus 2 by RT PCR NEGATIVE NEGATIVE Final    Comment: (NOTE) SARS-CoV-2 target nucleic acids are NOT DETECTED. The SARS-CoV-2 RNA is generally detectable in upper respiratoy specimens during the acute phase of infection. The lowest concentration of SARS-CoV-2 viral copies this assay can detect is 131 copies/mL. A negative result does  not preclude SARS-Cov-2 infection and should not be used as the sole basis for treatment or other patient management decisions. A negative result may occur with  improper specimen collection/handling, submission of specimen other than nasopharyngeal swab, presence of viral mutation(s) within the areas targeted by this assay, and inadequate number of viral copies (<131 copies/mL). A negative result must be combined with clinical observations, patient history, and epidemiological information. The expected result is Negative. Fact Sheet for Patients:  PinkCheek.be Fact Sheet for Healthcare Providers:  GravelBags.it This test is not yet ap proved or cleared by the [place] FDA and  has been authorized for detection and/or diagnosis of SARS-CoV-2 by FDA under an Emergency Use Authorization (EUA). This EUA will remain  in effect (meaning this test can be used) for the duration of the COVID-19 declaration under Section 564(b)(1) of the Act, 21 U.S.C. section 360bbb-3(b)(1), unless the authorization is terminated or revoked sooner.    Influenza A by PCR NEGATIVE NEGATIVE Final   Influenza B by PCR NEGATIVE NEGATIVE Final    Comment: (NOTE) The Xpert Xpress SARS-CoV-2/FLU/RSV  assay is intended as an aid in  the diagnosis of influenza from Nasopharyngeal swab specimens and  should not be used as a sole basis for treatment. Nasal washings and  aspirates are unacceptable for Xpert Xpress SARS-CoV-2/FLU/RSV  testing. Fact Sheet for Patients: PinkCheek.be Fact Sheet for Healthcare Providers: GravelBags.it This test is not yet approved or cleared by the [place] FDA and  has been authorized for detection and/or diagnosis of SARS-CoV-2 by  FDA under an Emergency Use Authorization (EUA). This EUA will remain  in effect (meaning this test can be used) for the duration of the    Covid-19 declaration under Section 564(b)(1) of the Act, 21  U.S.C. section 360bbb-3(b)(1), unless the authorization is  terminated or revoked. Performed at [facility], [address] [place], [place] [address]      Radiology Studies: CT ABDOMEN PELVIS W CONTRAST  Result Date: 03/13/2019 CLINICAL DATA:  Diffuse abdominal pain, worse on the left EXAM: CT ABDOMEN AND PELVIS WITH CONTRAST TECHNIQUE: Multidetector CT imaging of the abdomen and pelvis was performed using the standard protocol following bolus administration of intravenous contrast. CONTRAST:  165mL OMNIPAQUE IOHEXOL 300 MG/ML  SOLN COMPARISON:  05/02/2006 FINDINGS: Lower chest:  Coronary atherosclerosis.  Trace pericardial fluid. Hepatobiliary: Bilobed but simple appearing cystic density in the right liver measuring up to 25 mm. Cholecystectomy which accounts for mild intra and extrahepatic bile duct dilatation. No calcified choledocholithiasis. Pancreas: Unremarkable. Spleen: Unremarkable. Adrenals/Urinary Tract: Negative adrenals. No hydronephrosis or stone. Tiny cystic density from the lower pole right kidney. Unremarkable bladder. Stomach/Bowel: Sigmoidectomy with continuity surgery since prior. There are distorted and dilated loops of small bowel in the central abdomen with a tethered appearance at the level of the central abdomen and low pelvis, attributed to adhesive disease. Some interloop fluid and mesenteric congestion is noted. 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 The colon is relatively decompressed. Vascular/Lymphatic: No acute vascular abnormality. Atherosclerotic calcifications. No mass or adenopathy. Reproductive:No worrisome finding Other: No pneumoperitoneum. Minimal reactive appearing interloop fluid. Musculoskeletal: Left hip arthroplasty and severe right hip osteoarthritis. Generalized lumbar spine degeneration with disc narrowing and spurring.  There is mild scoliosis. No acute osseous finding. IMPRESSION: 1. 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 2.  Aortic Atherosclerosis (ICD10-I70.0).  Coronary atherosclerosis. Electronically Signed   By: Monte Fantasia M.D.   On: 03/13/2019 04:31   DG Abd Portable 1 View  Result Date: 03/13/2019 CLINICAL DATA:  NG tube placement. EXAM: PORTABLE ABDOMEN - 1 VIEW COMPARISON:  CT scan 03/13/2019 FINDINGS: The NG tube tip is in the body region of the stomach. The proximal port is in the fundal area. The lungs are clear. Stable bowel gas pattern. IMPRESSION: NG tube in good position. Electronically Signed   By: Marijo Sanes M.D.   On: 03/13/2019 06:43    Scheduled Meds: . enoxaparin (LOVENOX) injection  40 mg Subcutaneous Q24H  . levothyroxine  88 mcg Intravenous Daily  . mouth rinse  15 mL Mouth Rinse BID  . pantoprazole (PROTONIX) IV  40 mg Intravenous Q12H  . venlafaxine XR  37.5 mg Oral Q supper   Continuous Infusions: . dextrose 5% lactated ringers 125 mL/hr at 03/14/19 0403     LOS: 1 day   Time spent: 40 minutes.  Lorella Nimrod, MD [facility]  If 7PM-7AM, please contact night-coverage  [email or web address]  03/14/2019, 7:33 AM   This record has been created using Systems analyst. Errors have been sought and corrected,but may not always be located. Such creation errors do not reflect on the standard of care.

## 2019-03-15 LAB — BASIC METABOLIC PANEL
Anion gap: 11 (ref 5–15)
BUN: 8 mg/dL (ref 8–23)
CO2: 32 mmol/L (ref 22–32)
Calcium: 8.7 mg/dL — ABNORMAL LOW (ref 8.9–10.3)
Chloride: 100 mmol/L (ref 98–111)
Creatinine, Ser: 0.62 mg/dL (ref 0.44–1.00)
GFR calc Af Amer: >60 mL/min (ref >60)
GFR calc non Af Amer: >60 mL/min (ref >60)
Glucose, Bld: 133 mg/dL — ABNORMAL HIGH (ref 70–99)
Potassium: 3.5 mmol/L (ref 3.5–5.1)
Sodium: 143 mmol/L (ref 135–145)

## 2019-03-15 LAB — MAGNESIUM: Magnesium: 1.8 mg/dL (ref 1.7–2.4)

## 2019-03-15 MED ORDER — MENTHOL 3 MG MT LOZG
1.0000 | LOZENGE | OROMUCOSAL | Status: DC | PRN
  Filled 2019-03-15 (×2): qty 9

## 2019-03-15 NOTE — Progress Notes (Signed)
PROGRESS NOTE    Pamela Dodson  [id number] DOB: [birth year] DOA: 03/13/2019 PCP: Hubbard Hartshorn, FNP   Brief Narrative:  Pamela Dodson is a 69 y.o. female with medical history significant for surgical hypothyroidism, stage II CKD, pretension and history of abdominal surgery to include bowel resection secondary to diverticulitis, appendectomy and cholecystectomy who presents to the emergency room with crampy mid abdominal pain that started on 6 PM the day prior to arrival, associated with nausea and vomiting.  Labs positive for leukocytosis and CT abdomen concerning for SBO.  NG was placed and surgery was consulted.  Subjective: Patient had some nausea earlier this morning.  No current nausea or vomiting.  NG tube was clamped approximately 2 hours ago.  Had a BM last night.  Assessment & Plan:   Principal Problem:   SBO (small bowel obstruction) (HCC) Active Problems:   Hypertension goal BP (blood pressure) < 150/90   Post-surgical hypothyroidism   Chronic kidney disease, stage II (mild)   History of resection of large bowel   Small bowel obstruction (HCC)  SBO (small bowel obstruction) ([place]).  Patient is high risk due to her prior history of multiple abdominal surgeries which include sigmoid colostomy, appendectomy, C-section and cholecystectomy. Gastrografin studies with contrast in colon and rectum.  Had 1 BM, good bowel sounds.  Overall seems improving.  NG tube was clamped by surgery, if she was able to tolerate it they will remove NG tube and initiate p.o. intake. Surgery is following-appreciate their recommendations. -Continue IV fluid. -Continue IV antiemetics.  Hpertension goal BP (blood pressure) < 150/90. Blood pressure remained within goal. -Continue as needed labetalol 10 mg IV. -We will restart home meds once able to tolerate p.o.   Post-surgical hypothyroidism.  History of thyroid cancer.  Patient was on 175 MCG of Synthroid at home.  Giving IV levothyroxine 88  MCG with the help of pharmacy. -We will resume home dose once start taking p.o.  Chronic kidney disease, stage II (mild) -At baseline.  Objective: Vitals:   03/14/19 1332 03/14/19 2029 03/15/19 0323 03/15/19 0947  BP: 127/67 (!) 130/58 (!) 145/68 118/76  Pulse: 90 76 87 86  Resp: 18 18 20 18   Temp: 99.5 F (37.5 C) 99.1 F (37.3 C) 98.9 F (37.2 C) 99.1 F (37.3 C)  TempSrc: Oral Oral Oral Oral  SpO2: 96% 91% 98%   Weight:      Height:        Intake/Output Summary (Last 24 hours) at 03/15/2019 1117 Last data filed at 03/15/2019 0500 Gross per 24 hour  Intake 3040.46 ml  Output 2350 ml  Net 690.46 ml   Filed Weights   03/13/19 0139  Weight: 86.2 kg    Examination:  General exam: Appears calm and comfortable.  NG tube in place. Respiratory system: Clear to auscultation. Respiratory effort normal. Cardiovascular system: S1 & S2 heard, RRR. No JVD, murmurs, rubs, gallops or clicks. Gastrointestinal system: Soft, nontender, nondistended, bowel sounds positive. Central nervous system: Alert and oriented. No focal neurological deficits.Symmetric 5 x 5 power. Extremities: No edema, no cyanosis, pulses intact and symmetrical. Skin: No rashes, lesions or ulcers Psychiatry: Judgement and insight appear normal. Mood & affect appropriate.    DVT prophylaxis: Lovenox Code Status: Full Family Communication: Patient was updated. Disposition Plan: Pending improvement.  She will go back home in 2 to 3 days if able to tolerate p.o. intake after removal of NG.  Consultants:   General surgery.  Procedures:  Antimicrobials:  Data Reviewed: I have personally reviewed following labs and imaging studies  CBC: Recent Labs  Lab 03/13/19 0141 03/14/19 0436  WBC 10.3 6.4  HGB 14.3 11.7*  HCT 43.5 36.1  MCV 93.8 95.3  PLT 277 [id number]   Basic Metabolic Panel: Recent Labs  Lab 03/09/19 0950 03/13/19 0141 03/14/19 0436 03/15/19 0332  NA 143 139 141 143  K 4.1 4.5 3.5 3.5   CL 105 100 103 100  CO2 28 31 31  32  GLUCOSE 105* 156* 134* 133*  BUN 19 25* 13 8  CREATININE 0.59* 0.76 0.48 0.62  CALCIUM 9.3 9.7 8.3* 8.7*  MG  --   --   --  1.8   GFR: Estimated Creatinine Clearance: 68.1 mL/min (by C-G formula based on SCr of 0.62 mg/dL). Liver Function Tests: Recent Labs  Lab 03/09/19 0950 03/13/19 0141 03/14/19 0436  AST 20 24 17   ALT 19 22 18   ALKPHOS  --  74 58  BILITOT 0.5 0.9 0.7  PROT 6.9 7.9 6.4*  ALBUMIN  --  4.5 3.4*   Recent Labs  Lab 03/13/19 0141  LIPASE 27   No results for input(s): AMMONIA in the last 168 hours. Coagulation Profile: No results for input(s): INR, PROTIME in the last 168 hours. Cardiac Enzymes: No results for input(s): CKTOTAL, CKMB, CKMBINDEX, TROPONINI in the last 168 hours. BNP (last 3 results) No results for input(s): PROBNP in the last 8760 hours. HbA1C: No results for input(s): HGBA1C in the last 72 hours. CBG: No results for input(s): GLUCAP in the last 168 hours. Lipid Profile: No results for input(s): CHOL, HDL, LDLCALC, TRIG, CHOLHDL, LDLDIRECT in the last 72 hours. Thyroid Function Tests: No results for input(s): TSH, T4TOTAL, FREET4, T3FREE, THYROIDAB in the last 72 hours. Anemia Panel: No results for input(s): VITAMINB12, FOLATE, FERRITIN, TIBC, IRON, RETICCTPCT in the last 72 hours. Sepsis Labs: No results for input(s): PROCALCITON, LATICACIDVEN in the last 168 hours.  Recent Results (from the past 240 hour(s))  Respiratory Panel by RT PCR (Flu A&B, Covid) - Nasopharyngeal Swab     Status: None   Collection Time: 03/13/19  5:43 AM   Specimen: Nasopharyngeal Swab  Result Value Ref Range Status   SARS Coronavirus 2 by RT PCR NEGATIVE NEGATIVE Final    Comment: (NOTE) SARS-CoV-2 target nucleic acids are NOT DETECTED. The SARS-CoV-2 RNA is generally detectable in upper respiratoy specimens during the acute phase of infection. The lowest concentration of SARS-CoV-2 viral copies this assay can  detect is 131 copies/mL. A negative result does not preclude SARS-Cov-2 infection and should not be used as the sole basis for treatment or other patient management decisions. A negative result may occur with  improper specimen collection/handling, submission of specimen other than nasopharyngeal swab, presence of viral mutation(s) within the areas targeted by this assay, and inadequate number of viral copies (<131 copies/mL). A negative result must be combined with clinical observations, patient history, and epidemiological information. The expected result is Negative. Fact Sheet for Patients:  PinkCheek.be Fact Sheet for Healthcare Providers:  GravelBags.it This test is not yet ap proved or cleared by the [place] FDA and  has been authorized for detection and/or diagnosis of SARS-CoV-2 by FDA under an Emergency Use Authorization (EUA). This EUA will remain  in effect (meaning this test can be used) for the duration of the COVID-19 declaration under Section 564(b)(1) of the Act, 21 U.S.C. section 360bbb-3(b)(1), unless the authorization is terminated or revoked sooner.    Influenza  A by PCR NEGATIVE NEGATIVE Final   Influenza B by PCR NEGATIVE NEGATIVE Final    Comment: (NOTE) The Xpert Xpress SARS-CoV-2/FLU/RSV assay is intended as an aid in  the diagnosis of influenza from Nasopharyngeal swab specimens and  should not be used as a sole basis for treatment. Nasal washings and  aspirates are unacceptable for Xpert Xpress SARS-CoV-2/FLU/RSV  testing. Fact Sheet for Patients: PinkCheek.be Fact Sheet for Healthcare Providers: GravelBags.it This test is not yet approved or cleared by the [place] FDA and  has been authorized for detection and/or diagnosis of SARS-CoV-2 by  FDA under an Emergency Use Authorization (EUA). This EUA will remain  in effect (meaning  this test can be used) for the duration of the  Covid-19 declaration under Section 564(b)(1) of the Act, 21  U.S.C. section 360bbb-3(b)(1), unless the authorization is  terminated or revoked. Performed at [facility], [address] [place], [place] [address]      Radiology Studies: DG Abd Portable 1V-Small Bowel Obstruction Protocol-initial, 8 hr delay  Result Date: 03/14/2019 CLINICAL DATA:  8 hour delay bowel obstruction EXAM: PORTABLE ABDOMEN - 1 VIEW COMPARISON:  03/14/2019, 03/13/2019, CT 03/13/2019 FINDINGS: Esophageal tube tip near the GE junction. Enteral contrast is present within the colon and rectum. No dilated loops of small bowel. Left hip replacement IMPRESSION: Contrast is present within the colon and rectum. No dilated small bowel is seen. Electronically Signed   By: Donavan Foil M.D.   On: 03/14/2019 20:50   DG Abd Portable 1V-Small Bowel Obstruction Protocol-initial, 8 hr delay  Result Date: 03/14/2019 CLINICAL DATA:  Small bowel obstruction EXAM: PORTABLE ABDOMEN - 1 VIEW COMPARISON:  03/13/2018 FINDINGS: NG tube tip is in the proximal stomach. Nonobstructive bowel gas pattern. Gas noted within nondistended large and small bowel. No organomegaly. No free air. Calcified phleboliths noted. IMPRESSION: Nonobstructive bowel gas pattern. NG tube tip in the proximal stomach. Electronically Signed   By: Rolm Baptise M.D.   On: 03/14/2019 10:08    Scheduled Meds: . enoxaparin (LOVENOX) injection  40 mg Subcutaneous Q24H  . levothyroxine  88 mcg Intravenous Daily  . mouth rinse  15 mL Mouth Rinse BID  . pantoprazole (PROTONIX) IV  40 mg Intravenous Q12H  . venlafaxine XR  37.5 mg Oral Q supper   Continuous Infusions: . dextrose 5% lactated ringers 125 mL/hr at 03/15/19 1013     LOS: 2 days   Time spent: 40 minutes.  Lorella Nimrod, MD [facility]  If 7PM-7AM, please contact night-coverage [email or web address]  03/15/2019, 11:17 AM   This record has been  created using Systems analyst. Errors have been sought and corrected,but may not always be located. Such creation errors do not reflect on the standard of care.

## 2019-03-15 NOTE — Progress Notes (Addendum)
[facility] SURGICAL PROGRESS NOTE (cpt [phone])  Hospital Day(s): 2.   Interval History: Patient seen and examined, no acute events or new complaints overnight. Patient reports she is feeling much better this morning. No abdominal pain, nausea, or emesis. Labs stable. NGT output 900 ccs in last 24 hours. Did get KUB with gastrografin yesterday which shows contrast throughout the colon. Did have BM yesterday. No new complaints.   Review of Systems:  Constitutional: denies fever, chills  HEENT: denies cough or congestion  Respiratory: denies any shortness of breath  Cardiovascular: denies chest pain or palpitations  Gastrointestinal: denies abdominal pain, N/V, or diarrhea/and bowel function as per interval history Genitourinary: denies burning with urination or urinary frequency   Vital signs in last 24 hours: [min-max] current  Temp:  [98.9 F (37.2 C)-99.5 F (37.5 C)] 98.9 F (37.2 C) (03/11 0323) Pulse Rate:  [76-90] 87 (03/11 0323) Resp:  [18-20] 20 (03/11 0323) BP: (127-145)/(58-68) 145/68 (03/11 0323) SpO2:  [91 %-98 %] 98 % (03/11 0323)     Height: 5\' 3"  (160 cm) Weight: 86.2 kg BMI (Calculated): 33.67   Intake/Output last 2 shifts:  03/10 0701 - 03/11 0700 In: 3040.5 [I.V.:3040.5] Out: 2350 [Urine:1200; Emesis/NG output:900; Stool:250]   Physical Exam:  Constitutional: alert, cooperative and no distress  HENT: normocephalic without obvious abnormality, NGT in place  Respiratory: breathing non-labored at rest  Cardiovascular: regular rate and sinus rhythm  Gastrointestinal: soft, non-tender, and non-distended, no rebound/guarding Musculoskeletal: no edema or wounds, motor and sensation grossly intact, NT    Labs:  CBC Latest Ref Rng & Units 03/14/2019 03/13/2019 01/06/2016  WBC 4.0 - 10.5 K/uL 6.4 10.3 5.4  Hemoglobin 12.0 - 15.0 g/dL 11.7(L) 14.3 13.8  Hematocrit 36.0 - 46.0 % 36.1 43.5 41.9  Platelets 150 - 400 K/uL 206 277 279   CMP Latest Ref Rng &  Units 03/15/2019 03/14/2019 03/13/2019  Glucose 70 - 99 mg/dL 133(H) 134(H) 156(H)  BUN 8 - 23 mg/dL 8 13 25(H)  Creatinine 0.44 - 1.00 mg/dL 0.62 0.48 0.76  Sodium 135 - 145 mmol/L 143 141 139  Potassium 3.5 - 5.1 mmol/L 3.5 3.5 4.5  Chloride 98 - 111 mmol/L 100 103 100  CO2 22 - 32 mmol/L 32 31 31  Calcium 8.9 - 10.3 mg/dL 8.7(L) 8.3(L) 9.7  Total Protein 6.5 - 8.1 g/dL - 6.4(L) 7.9  Total Bilirubin 0.3 - 1.2 mg/dL - 0.7 0.9  Alkaline Phos 38 - 126 U/L - 58 74  AST 15 - 41 U/L - 17 24  ALT 0 - 44 U/L - 18 22     Imaging studies:   KUB 8-hr delay with contrast (03/14/2019) personally reviewed showing contrast throughout the colon, and radiologist report reviewed below:  IMPRESSION: Contrast is present within the colon and rectum. No dilated small bowel is seen.   Assessment/Plan: (ICD-10's: K4.609) 69 y.o. 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  -- Edison Simon, PA-C  [facility] 03/15/2019, 7:36 AM [phone] M-F: 7am - 4pm  I saw and evaluated the patient.  I agree with the above documentation, exam, and plan, which I have edited where appropriate. Fredirick Maudlin  11:16 AM

## 2019-03-15 NOTE — Progress Notes (Signed)
Patient had bowel movement, it was dark brown, pudding like consistency with some lumps in it.

## 2019-03-16 LAB — BASIC METABOLIC PANEL
Anion gap: 8 (ref 5–15)
BUN: 5 mg/dL — ABNORMAL LOW (ref 8–23)
CO2: 31 mmol/L (ref 22–32)
Calcium: 8.2 mg/dL — ABNORMAL LOW (ref 8.9–10.3)
Chloride: 101 mmol/L (ref 98–111)
Creatinine, Ser: 0.58 mg/dL (ref 0.44–1.00)
GFR calc Af Amer: >60 mL/min (ref >60)
GFR calc non Af Amer: >60 mL/min (ref >60)
Glucose, Bld: 137 mg/dL — ABNORMAL HIGH (ref 70–99)
Potassium: 3.2 mmol/L — ABNORMAL LOW (ref 3.5–5.1)
Sodium: 140 mmol/L (ref 135–145)

## 2019-03-16 LAB — MAGNESIUM: Magnesium: 1.6 mg/dL — ABNORMAL LOW (ref 1.7–2.4)

## 2019-03-16 MED ORDER — AMLODIPINE BESYLATE 10 MG PO TABS
10.0000 mg | ORAL_TABLET | Freq: Every day | ORAL | Status: DC
  Administered 2019-03-16 – 2019-03-17 (×2): 10 mg via ORAL
  Filled 2019-03-16 (×2): qty 1

## 2019-03-16 MED ORDER — POTASSIUM CHLORIDE CRYS ER 20 MEQ PO TBCR
40.0000 meq | EXTENDED_RELEASE_TABLET | Freq: Once | ORAL | Status: AC
  Administered 2019-03-16: 40 meq via ORAL
  Filled 2019-03-16: qty 2

## 2019-03-16 MED ORDER — POTASSIUM CHLORIDE 10 MEQ/100ML IV SOLN
10.0000 meq | INTRAVENOUS | Status: DC
  Administered 2019-03-16 (×2): 10 meq via INTRAVENOUS
  Filled 2019-03-16 (×3): qty 100

## 2019-03-16 MED ORDER — MAGNESIUM SULFATE 4 GM/100ML IV SOLN
4.0000 g | Freq: Once | INTRAVENOUS | Status: AC
  Administered 2019-03-16: 4 g via INTRAVENOUS
  Filled 2019-03-16: qty 100

## 2019-03-16 MED ORDER — LEVOTHYROXINE SODIUM 175 MCG PO TABS
175.0000 ug | ORAL_TABLET | Freq: Every day | ORAL | Status: DC
  Administered 2019-03-17: 175 ug via ORAL
  Filled 2019-03-16: qty 1

## 2019-03-16 MED ORDER — LORATADINE 10 MG PO TABS
10.0000 mg | ORAL_TABLET | Freq: Every day | ORAL | Status: DC
  Filled 2019-03-16 (×2): qty 1

## 2019-03-16 MED ORDER — ATORVASTATIN CALCIUM 20 MG PO TABS
80.0000 mg | ORAL_TABLET | Freq: Every day | ORAL | Status: DC
  Administered 2019-03-16: 80 mg via ORAL
  Filled 2019-03-16: qty 4

## 2019-03-16 NOTE — Progress Notes (Signed)
PROGRESS NOTE    Pamela Dodson  [id number] DOB: [birth year] DOA: 03/13/2019 PCP: Hubbard Hartshorn, FNP   Brief Narrative:  Pamela Dodson is a 69 y.o. female with medical history significant for surgical hypothyroidism, stage II CKD, pretension and history of abdominal surgery to include bowel resection secondary to diverticulitis, appendectomy and cholecystectomy who presents to the emergency room with crampy mid abdominal pain that started on 6 PM the day prior to arrival, associated with nausea and vomiting.  Labs positive for leukocytosis and CT abdomen concerning for SBO.  NG was placed and surgery was consulted.  Subjective: Patient was feeling better when seen today.  NG tube was out and she was tolerating liquid diet.  Denies any more nausea or vomiting.  Had 2 BMs.  Assessment & Plan:   Principal Problem:   SBO (small bowel obstruction) (HCC) Active Problems:   Hypertension goal BP (blood pressure) < 150/90   Post-surgical hypothyroidism   Chronic kidney disease, stage II (mild)   History of resection of large bowel   Small bowel obstruction (HCC)  SBO (small bowel obstruction) ([place]).  Resolved now.  Patient is high risk due to her prior history of multiple abdominal surgeries which include sigmoid colostomy, appendectomy, C-section and cholecystectomy. Gastrografin studies with contrast in colon and rectum.  Had 2 BM, good bowel sounds.  Tolerating liquid diet. -Advance diet. Surgery is following-appreciate their recommendations. -Discontinue IV fluid. -Continue IV antiemetics.  Electrolyte abnormalities.  Patient with hypokalemia and hypomagnesemia today. -Replete electrolytes and monitor.  Hpertension goal BP (blood pressure) < 150/90. Blood pressure remained within goal. -Restart home antihypertensives/   Post-surgical hypothyroidism.  History of thyroid cancer.  Patient was on 175 MCG of Synthroid at home.  Giving IV levothyroxine 88 MCG with the help of  pharmacy. -Resume home dose of Synthroid and discontinue IV  Chronic kidney disease, stage II (mild) -At baseline.  Objective: Vitals:   03/15/19 0323 03/15/19 0947 03/15/19 2118 03/16/19 0438  BP: (!) 145/68 118/76 127/89 131/72  Pulse: 87 86 82 72  Resp: 20 18  18   Temp: 98.9 F (37.2 C) 99.1 F (37.3 C) 99.1 F (37.3 C) 98.6 F (37 C)  TempSrc: Oral Oral Oral Oral  SpO2: 98%  94% 96%  Weight:      Height:        Intake/Output Summary (Last 24 hours) at 03/16/2019 1328 Last data filed at 03/16/2019 1115 Gross per 24 hour  Intake 3608.89 ml  Output 1 ml  Net 3607.89 ml   Filed Weights   03/13/19 0139  Weight: 86.2 kg    Examination:  General exam: Appears calm and comfortable.   Respiratory system: Clear to auscultation. Respiratory effort normal. Cardiovascular system: S1 & S2 heard, RRR. No JVD, murmurs, rubs, gallops or clicks. Gastrointestinal system: Soft, nontender, nondistended, bowel sounds positive. Central nervous system: Alert and oriented. No focal neurological deficits.Symmetric 5 x 5 power. Extremities: No edema, no cyanosis, pulses intact and symmetrical. Skin: No rashes, lesions or ulcers Psychiatry: Judgement and insight appear normal. Mood & affect appropriate.    DVT prophylaxis: Lovenox Code Status: Full Family Communication: Patient was updated. Disposition Plan: Advance diet today and if able to tolerate then she can go home tomorrow.  Consultants:   General surgery.  Procedures:  Antimicrobials:   Data Reviewed: I have personally reviewed following labs and imaging studies  CBC: Recent Labs  Lab 03/13/19 0141 03/14/19 0436  WBC 10.3 6.4  HGB 14.3 11.7*  HCT 43.5 36.1  MCV 93.8 95.3  PLT 277 [id number]   Basic Metabolic Panel: Recent Labs  Lab 03/13/19 0141 03/14/19 0436 03/15/19 0332 03/16/19 0426  NA 139 141 143 140  K 4.5 3.5 3.5 3.2*  CL 100 103 100 101  CO2 31 31 32 31  GLUCOSE 156* 134* 133* 137*  BUN 25* 13 8 5*   CREATININE 0.76 0.48 0.62 0.58  CALCIUM 9.7 8.3* 8.7* 8.2*  MG  --   --  1.8 1.6*   GFR: Estimated Creatinine Clearance: 68.1 mL/min (by C-G formula based on SCr of 0.58 mg/dL). Liver Function Tests: Recent Labs  Lab 03/13/19 0141 03/14/19 0436  AST 24 17  ALT 22 18  ALKPHOS 74 58  BILITOT 0.9 0.7  PROT 7.9 6.4*  ALBUMIN 4.5 3.4*   Recent Labs  Lab 03/13/19 0141  LIPASE 27   No results for input(s): AMMONIA in the last 168 hours. Coagulation Profile: No results for input(s): INR, PROTIME in the last 168 hours. Cardiac Enzymes: No results for input(s): CKTOTAL, CKMB, CKMBINDEX, TROPONINI in the last 168 hours. BNP (last 3 results) No results for input(s): PROBNP in the last 8760 hours. HbA1C: No results for input(s): HGBA1C in the last 72 hours. CBG: No results for input(s): GLUCAP in the last 168 hours. Lipid Profile: No results for input(s): CHOL, HDL, LDLCALC, TRIG, CHOLHDL, LDLDIRECT in the last 72 hours. Thyroid Function Tests: No results for input(s): TSH, T4TOTAL, FREET4, T3FREE, THYROIDAB in the last 72 hours. Anemia Panel: No results for input(s): VITAMINB12, FOLATE, FERRITIN, TIBC, IRON, RETICCTPCT in the last 72 hours. Sepsis Labs: No results for input(s): PROCALCITON, LATICACIDVEN in the last 168 hours.  Recent Results (from the past 240 hour(s))  Respiratory Panel by RT PCR (Flu A&B, Covid) - Nasopharyngeal Swab     Status: None   Collection Time: 03/13/19  5:43 AM   Specimen: Nasopharyngeal Swab  Result Value Ref Range Status   SARS Coronavirus 2 by RT PCR NEGATIVE NEGATIVE Final    Comment: (NOTE) SARS-CoV-2 target nucleic acids are NOT DETECTED. The SARS-CoV-2 RNA is generally detectable in upper respiratoy specimens during the acute phase of infection. The lowest concentration of SARS-CoV-2 viral copies this assay can detect is 131 copies/mL. A negative result does not preclude SARS-Cov-2 infection and should not be used as the sole basis for  treatment or other patient management decisions. A negative result may occur with  improper specimen collection/handling, submission of specimen other than nasopharyngeal swab, presence of viral mutation(s) within the areas targeted by this assay, and inadequate number of viral copies (<131 copies/mL). A negative result must be combined with clinical observations, patient history, and epidemiological information. The expected result is Negative. Fact Sheet for Patients:  PinkCheek.be Fact Sheet for Healthcare Providers:  GravelBags.it This test is not yet ap proved or cleared by the [place] FDA and  has been authorized for detection and/or diagnosis of SARS-CoV-2 by FDA under an Emergency Use Authorization (EUA). This EUA will remain  in effect (meaning this test can be used) for the duration of the COVID-19 declaration under Section 564(b)(1) of the Act, 21 U.S.C. section 360bbb-3(b)(1), unless the authorization is terminated or revoked sooner.    Influenza A by PCR NEGATIVE NEGATIVE Final   Influenza B by PCR NEGATIVE NEGATIVE Final    Comment: (NOTE) The Xpert Xpress SARS-CoV-2/FLU/RSV assay is intended as an aid in  the diagnosis of influenza from Nasopharyngeal swab specimens and  should not  be used as a sole basis for treatment. Nasal washings and  aspirates are unacceptable for Xpert Xpress SARS-CoV-2/FLU/RSV  testing. Fact Sheet for Patients: PinkCheek.be Fact Sheet for Healthcare Providers: GravelBags.it This test is not yet approved or cleared by the [place] FDA and  has been authorized for detection and/or diagnosis of SARS-CoV-2 by  FDA under an Emergency Use Authorization (EUA). This EUA will remain  in effect (meaning this test can be used) for the duration of the  Covid-19 declaration under Section 564(b)(1) of the Act, 21  U.S.C. section  360bbb-3(b)(1), unless the authorization is  terminated or revoked. Performed at [facility], [address] [place], [place] [address]      Radiology Studies: DG Abd Portable 1V-Small Bowel Obstruction Protocol-initial, 8 hr delay  Result Date: 03/14/2019 CLINICAL DATA:  8 hour delay bowel obstruction EXAM: PORTABLE ABDOMEN - 1 VIEW COMPARISON:  03/14/2019, 03/13/2019, CT 03/13/2019 FINDINGS: Esophageal tube tip near the GE junction. Enteral contrast is present within the colon and rectum. No dilated loops of small bowel. Left hip replacement IMPRESSION: Contrast is present within the colon and rectum. No dilated small bowel is seen. Electronically Signed   By: Donavan Foil M.D.   On: 03/14/2019 20:50    Scheduled Meds: . enoxaparin (LOVENOX) injection  40 mg Subcutaneous Q24H  . levothyroxine  88 mcg Intravenous Daily  . mouth rinse  15 mL Mouth Rinse BID  . pantoprazole (PROTONIX) IV  40 mg Intravenous Q12H  . potassium chloride  40 mEq Oral Once  . venlafaxine XR  37.5 mg Oral Q supper   Continuous Infusions: . dextrose 5% lactated ringers Stopped (03/16/19 1005)     LOS: 3 days   Time spent: 40 minutes.  Lorella Nimrod, MD [facility]  If 7PM-7AM, please contact night-coverage [email or web address]  03/16/2019, 1:28 PM   This record has been created using Systems analyst. Errors have been sought and corrected,but may not always be located. Such creation errors do not reflect on the standard of care.

## 2019-03-16 NOTE — Progress Notes (Addendum)
[facility] SURGICAL PROGRESS NOTE (cpt [phone])  Hospital Day(s): 3.   Interval History: Patient seen and examined, no acute events or new complaints overnight. Patient reports she continues to feel improved. No fever, chills, nausea, or emesis. Abdominal pain remains resolved. NGT removed yesterday. Tolerated CLD. Having bowel movements x2. Hypomagnesemia and hypokalemia today. Otherwise no new complaints.   Review of Systems:  Constitutional: denies fever, chills  HEENT: denies cough or congestion  Respiratory: denies any shortness of breath  Cardiovascular: denies chest pain or palpitations  Gastrointestinal: denies abdominal pain, N/V, or diarrhea/and bowel function as per interval history Genitourinary: denies burning with urination or urinary frequency   Vital signs in last 24 hours: [min-max] current  Temp:  [98.6 F (37 C)-99.1 F (37.3 C)] 98.6 F (37 C) (03/12 0438) Pulse Rate:  [72-86] 72 (03/12 0438) Resp:  [18] 18 (03/12 0438) BP: (118-131)/(72-89) 131/72 (03/12 0438) SpO2:  [94 %-96 %] 96 % (03/12 0438)     Height: 5\' 3"  (160 cm) Weight: 86.2 kg BMI (Calculated): 33.67   Intake/Output last 2 shifts:  03/11 0701 - 03/12 0700 In: -  Out: 1 [Urine:1]   Physical Exam:  Constitutional: alert, cooperative and no distress  HENT: normocephalic without obvious abnormality  Eyes: PERRL, EOM's grossly intact and symmetric  Respiratory: breathing non-labored at rest  Cardiovascular: regular rate and sinus rhythm  Gastrointestinal: soft, non-tender, and non-distended, no rebound/guarding Musculoskeletal: no edema or wounds, motor and sensation grossly intact, NT    Labs:  CBC Latest Ref Rng & Units 03/14/2019 03/13/2019 01/06/2016  WBC 4.0 - 10.5 K/uL 6.4 10.3 5.4  Hemoglobin 12.0 - 15.0 g/dL 11.7(L) 14.3 13.8  Hematocrit 36.0 - 46.0 % 36.1 43.5 41.9  Platelets 150 - 400 K/uL 206 277 279   CMP Latest Ref Rng & Units 03/16/2019 03/15/2019 03/14/2019  Glucose  70 - 99 mg/dL 137(H) 133(H) 134(H)  BUN 8 - 23 mg/dL 5(L) 8 13  Creatinine 0.44 - 1.00 mg/dL 0.58 0.62 0.48  Sodium 135 - 145 mmol/L 140 143 141  Potassium 3.5 - 5.1 mmol/L 3.2(L) 3.5 3.5  Chloride 98 - 111 mmol/L 101 100 103  CO2 22 - 32 mmol/L 31 32 31  Calcium 8.9 - 10.3 mg/dL 8.2(L) 8.7(L) 8.3(L)  Total Protein 6.5 - 8.1 g/dL - - 6.4(L)  Total Bilirubin 0.3 - 1.2 mg/dL - - 0.7  Alkaline Phos 38 - 126 U/L - - 58  AST 15 - 41 U/L - - 17  ALT 0 - 44 U/L - - 18     Imaging studies: No new pertinent imaging studies   Assessment/Plan: (ICD-10's: K39.609) 69 y.o. 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  **No need for outpatient follow up with surgery.**  All of the above findings and recommendations were discussed with the patient, and the medical team, and all of patient's questions  were answered to her expressed satisfaction.  -- Edison Simon, PA-C [facility] 03/16/2019, 7:45 AM [phone] M-F: 7am - 4pm  I saw and evaluated the patient.  I agree with the above documentation, exam, and plan, which I have edited where appropriate. Fredirick Maudlin  9:47 AM

## 2019-03-16 NOTE — Care Management Important Message (Signed)
Important Message  Patient Details  Name: Pamela Dodson MRN: [id number] Date of Birth: 11-22-1950   Medicare Important Message Given:  Yes     Dannette Barbara 03/16/2019, 11:56 AM

## 2019-03-17 LAB — BASIC METABOLIC PANEL
Anion gap: 9 (ref 5–15)
BUN: 7 mg/dL — ABNORMAL LOW (ref 8–23)
CO2: 28 mmol/L (ref 22–32)
Calcium: 8.2 mg/dL — ABNORMAL LOW (ref 8.9–10.3)
Chloride: 105 mmol/L (ref 98–111)
Creatinine, Ser: 0.57 mg/dL (ref 0.44–1.00)
GFR calc Af Amer: >60 mL/min (ref >60)
GFR calc non Af Amer: >60 mL/min (ref >60)
Glucose, Bld: 111 mg/dL — ABNORMAL HIGH (ref 70–99)
Potassium: 3.7 mmol/L (ref 3.5–5.1)
Sodium: 142 mmol/L (ref 135–145)

## 2019-03-17 LAB — MAGNESIUM: Magnesium: 1.9 mg/dL (ref 1.7–2.4)

## 2019-03-17 MED ORDER — POLYETHYLENE GLYCOL 3350 17 G PO PACK
17.0000 g | PACK | ORAL | Status: AC
  Administered 2019-03-17: 17 g via ORAL
  Filled 2019-03-17: qty 1

## 2019-03-17 NOTE — Discharge Summary (Signed)
Physician Discharge Summary  Pamela Dodson [id number] DOB: April 25, 1950 DOA: 03/13/2019  PCP: Hubbard Hartshorn, FNP  Admit date: 03/13/2019 Discharge date: 03/17/2019  Admitted From: Home Disposition: Home  Recommendations for Outpatient Follow-up:  1. Follow up with PCP in 1-2 weeks 2. Please obtain BMP/CBC in one week 3. Please follow up on the following pending results: None  Home Health: No Equipment/Devices: None Discharge Condition: Stable CODE STATUS: Full Diet recommendation: Heart Healthy   Brief/Interim Summary: Pamela Amrhein Laroccois a 69 y.o.femalewith medical history significant forsurgical hypothyroidism, stage II CKD, hypertension and history of abdominal surgery to include bowel resection secondary to diverticulitis, appendectomy and cholecystectomy who presents to the emergency room with crampy mid abdominal pain that started on 6 PM the day prior to arrival, associated with nausea and vomiting.  Labs positive for leukocytosis and CT abdomen concerning for SBO.  NG was placed and surgery was consulted.  They recommended conservative management.  Patient is high risk due to her history of multiple abdominal surgeries.  Slowly she improved with conservative measures.  Gastrografin studies was done which shows contrast in her colon and rectum.  Started getting bowel movements.  Nausea, vomiting and abdominal pain resolved.  Started tolerating clear liquid which was advanced slowly and she was able to tolerate soft diet for discharge.  Patient did had electrolyte abnormalities during hospitalization which mostly include hypokalemia and hypomagnesemia which were repleted.  She will resume her home medications for hypertension and hypothyroidism and will follow up with primary care physician for further management.  Discharge Diagnoses:  Principal Problem:   SBO (small bowel obstruction) (HCC) Active Problems:   Hypertension goal BP (blood pressure) < 150/90   Post-surgical  hypothyroidism   Chronic kidney disease, stage II (mild)   History of resection of large bowel   Small bowel obstruction [facility])  Discharge Instructions  Discharge Instructions    Diet - low sodium heart healthy   Complete by: As directed    Discharge instructions   Complete by: As directed    It was pleasure taking care of you. Please keep yourself well-hydrated and advance diet slowly. Follow-up with your primary care physician for further management.   Increase activity slowly   Complete by: As directed      Allergies as of 03/17/2019   No Known Allergies     Medication List    TAKE these medications   amLODipine-benazepril 10-40 MG capsule Commonly known as: LOTREL Take 1 capsule by mouth at bedtime.   atorvastatin 80 MG tablet Commonly known as: LIPITOR Take 1 tablet (80 mg total) by mouth daily. What changed: when to take this   Cholecalciferol 25 MCG (1000 UT) capsule Take 1 capsule by mouth daily.   Fish Oil 1000 MG Cpdr Take 1 capsule by mouth 2 (two) times daily.   levothyroxine 175 MCG tablet Commonly known as: SYNTHROID Take 1 tablet (175 mcg total) by mouth daily before breakfast.   loratadine 10 MG tablet Commonly known as: CLARITIN Take 10 mg by mouth daily.   multivitamin tablet Take 1 tablet by mouth daily.   venlafaxine XR 37.5 MG 24 hr capsule Commonly known as: EFFEXOR-XR Take 1 capsule (37.5 mg total) by mouth daily with breakfast.   vitamin C 100 MG tablet Take 100 mg by mouth daily.       No Known Allergies  Consultations:  Surgery  Procedures/Studies: CT ABDOMEN PELVIS W CONTRAST  Result Date: 03/13/2019 CLINICAL DATA:  Diffuse abdominal pain, worse on  the left EXAM: CT ABDOMEN AND PELVIS WITH CONTRAST TECHNIQUE: Multidetector CT imaging of the abdomen and pelvis was performed using the standard protocol following bolus administration of intravenous contrast. CONTRAST:  164mL OMNIPAQUE IOHEXOL 300 MG/ML  SOLN COMPARISON:   05/02/2006 FINDINGS: Lower chest:  Coronary atherosclerosis.  Trace pericardial fluid. Hepatobiliary: Bilobed but simple appearing cystic density in the right liver measuring up to 25 mm. Cholecystectomy which accounts for mild intra and extrahepatic bile duct dilatation. No calcified choledocholithiasis. Pancreas: Unremarkable. Spleen: Unremarkable. Adrenals/Urinary Tract: Negative adrenals. No hydronephrosis or stone. Tiny cystic density from the lower pole right kidney. Unremarkable bladder. Stomach/Bowel: Sigmoidectomy with continuity surgery since prior. There are distorted and dilated loops of small bowel in the central abdomen with a tethered appearance at the level of the central abdomen and low pelvis, attributed to adhesive disease. Some interloop fluid and mesenteric congestion is noted. 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 The colon is relatively decompressed. Vascular/Lymphatic: No acute vascular abnormality. Atherosclerotic calcifications. No mass or adenopathy. Reproductive:No worrisome finding Other: No pneumoperitoneum. Minimal reactive appearing interloop fluid. Musculoskeletal: Left hip arthroplasty and severe right hip osteoarthritis. Generalized lumbar spine degeneration with disc narrowing and spurring. There is mild scoliosis. No acute osseous finding. IMPRESSION: 1. 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 2.  Aortic Atherosclerosis (ICD10-I70.0).  Coronary atherosclerosis. Electronically Signed   By: Monte Fantasia M.D.   On: 03/13/2019 04:31   DG Abd Portable 1V-Small Bowel Obstruction Protocol-initial, 8 hr delay  Result Date: 03/14/2019 CLINICAL DATA:  8 hour delay bowel obstruction EXAM: PORTABLE ABDOMEN - 1 VIEW COMPARISON:  03/14/2019, 03/13/2019, CT 03/13/2019 FINDINGS: Esophageal tube tip  near the GE junction. Enteral contrast is present within the colon and rectum. No dilated loops of small bowel. Left hip replacement IMPRESSION: Contrast is present within the colon and rectum. No dilated small bowel is seen. Electronically Signed   By: Donavan Foil M.D.   On: 03/14/2019 20:50   DG Abd Portable 1V-Small Bowel Obstruction Protocol-initial, 8 hr delay  Result Date: 03/14/2019 CLINICAL DATA:  Small bowel obstruction EXAM: PORTABLE ABDOMEN - 1 VIEW COMPARISON:  03/13/2018 FINDINGS: NG tube tip is in the proximal stomach. Nonobstructive bowel gas pattern. Gas noted within nondistended large and small bowel. No organomegaly. No free air. Calcified phleboliths noted. IMPRESSION: Nonobstructive bowel gas pattern. NG tube tip in the proximal stomach. Electronically Signed   By: Rolm Baptise M.D.   On: 03/14/2019 10:08   DG Abd Portable 1 View  Result Date: 03/13/2019 CLINICAL DATA:  NG tube placement. EXAM: PORTABLE ABDOMEN - 1 VIEW COMPARISON:  CT scan 03/13/2019 FINDINGS: The NG tube tip is in the body region of the stomach. The proximal port is in the fundal area. The lungs are clear. Stable bowel gas pattern. IMPRESSION: NG tube in good position. Electronically Signed   By: Marijo Sanes M.D.   On: 03/13/2019 06:43   Subjective: Patient was feeling better when seen today.  She was excited to go home.  She did not had any bowel movement since Thursday.  No nausea or vomiting.  Passing flatus.  No abdominal pain.  Able to tolerate soft to regular diet.  We discussed about getting MiraLAX to regulate her bowel movements.  Patient will get 1 dose before leaving.  Discharge Exam: Vitals:   03/16/19 2031 03/17/19 0530  BP: 136/70 118/68  Pulse: 91 77  Resp: 18 18  Temp: 99 F (37.2 C) 98.2 F (36.8 C)  SpO2: 95% 91%   Vitals:   03/16/19 1413 03/16/19 1837 03/16/19 2031 03/17/19 0530  BP: (!) 139/116 138/85 136/70 118/68  Pulse: 90  91 77  Resp: 20  18 18   Temp: 98.3 F (36.8 C)   99 F (37.2 C) 98.2 F (36.8 C)  TempSrc: Oral  Oral Oral  SpO2: 97%  95% 91%  Weight:      Height:        General: Pt is alert, awake, not in acute distress Cardiovascular: RRR, S1/S2 +, no rubs, no gallops Respiratory: CTA bilaterally, no wheezing, no rhonchi Abdominal: Soft, NT, ND, bowel sounds + Extremities: no edema, no cyanosis   The results of significant diagnostics from this hospitalization (including imaging, microbiology, ancillary and laboratory) are listed below for reference.    Microbiology: Recent Results (from the past 240 hour(s))  Respiratory Panel by RT PCR (Flu A&B, Covid) - Nasopharyngeal Swab     Status: None   Collection Time: 03/13/19  5:43 AM   Specimen: Nasopharyngeal Swab  Result Value Ref Range Status   SARS Coronavirus 2 by RT PCR NEGATIVE NEGATIVE Final    Comment: (NOTE) SARS-CoV-2 target nucleic acids are NOT DETECTED. The SARS-CoV-2 RNA is generally detectable in upper respiratoy specimens during the acute phase of infection. The lowest concentration of SARS-CoV-2 viral copies this assay can detect is 131 copies/mL. A negative result does not preclude SARS-Cov-2 infection and should not be used as the sole basis for treatment or other patient management decisions. A negative result may occur with  improper specimen collection/handling, submission of specimen other than nasopharyngeal swab, presence of viral mutation(s) within the areas targeted by this assay, and inadequate number of viral copies (<131 copies/mL). A negative result must be combined with clinical observations, patient history, and epidemiological information. The expected result is Negative. Fact Sheet for Patients:  PinkCheek.be Fact Sheet for Healthcare Providers:  GravelBags.it This test is not yet ap proved or cleared by the [place] FDA and  has been authorized for detection and/or diagnosis of SARS-CoV-2  by FDA under an Emergency Use Authorization (EUA). This EUA will remain  in effect (meaning this test can be used) for the duration of the COVID-19 declaration under Section 564(b)(1) of the Act, 21 U.S.C. section 360bbb-3(b)(1), unless the authorization is terminated or revoked sooner.    Influenza A by PCR NEGATIVE NEGATIVE Final   Influenza B by PCR NEGATIVE NEGATIVE Final    Comment: (NOTE) The Xpert Xpress SARS-CoV-2/FLU/RSV assay is intended as an aid in  the diagnosis of influenza from Nasopharyngeal swab specimens and  should not be used as a sole basis for treatment. Nasal washings and  aspirates are unacceptable for Xpert Xpress SARS-CoV-2/FLU/RSV  testing. Fact Sheet for Patients: PinkCheek.be Fact Sheet for Healthcare Providers: GravelBags.it This test is not yet approved or cleared by the [place] FDA and  has been authorized for detection and/or diagnosis of SARS-CoV-2 by  FDA under an Emergency Use Authorization (EUA). This EUA will remain  in effect (meaning this test can be used) for the duration of the  Covid-19 declaration under Section 564(b)(1) of the Act, 21  U.S.C. section 360bbb-3(b)(1), unless the authorization is  terminated or revoked. Performed at [facility], [address] [place], [place] [address]      Labs: BNP (last 3 results) No results for  input(s): BNP in the last 8760 hours. Basic Metabolic Panel: Recent Labs  Lab 03/13/19 0141 03/14/19 0436 03/15/19 0332 03/16/19 0426 03/17/19 0532  NA 139 141 143 140 142  K 4.5 3.5 3.5 3.2* 3.7  CL 100 103 100 101 105  CO2 31 31 32 31 28  GLUCOSE 156* 134* 133* 137* 111*  BUN 25* 13 8 5* 7*  CREATININE 0.76 0.48 0.62 0.58 0.57  CALCIUM 9.7 8.3* 8.7* 8.2* 8.2*  MG  --   --  1.8 1.6* 1.9   Liver Function Tests: Recent Labs  Lab 03/13/19 0141 03/14/19 0436  AST 24 17  ALT 22 18  ALKPHOS 74 58  BILITOT 0.9 0.7   PROT 7.9 6.4*  ALBUMIN 4.5 3.4*   Recent Labs  Lab 03/13/19 0141  LIPASE 27   No results for input(s): AMMONIA in the last 168 hours. CBC: Recent Labs  Lab 03/13/19 0141 03/14/19 0436  WBC 10.3 6.4  HGB 14.3 11.7*  HCT 43.5 36.1  MCV 93.8 95.3  PLT 277 206   Cardiac Enzymes: No results for input(s): CKTOTAL, CKMB, CKMBINDEX, TROPONINI in the last 168 hours. BNP: Invalid input(s): POCBNP CBG: No results for input(s): GLUCAP in the last 168 hours. D-Dimer No results for input(s): DDIMER in the last 72 hours. Hgb A1c No results for input(s): HGBA1C in the last 72 hours. Lipid Profile No results for input(s): CHOL, HDL, LDLCALC, TRIG, CHOLHDL, LDLDIRECT in the last 72 hours. Thyroid function studies No results for input(s): TSH, T4TOTAL, T3FREE, THYROIDAB in the last 72 hours.  Invalid input(s): FREET3 Anemia work up No results for input(s): VITAMINB12, FOLATE, FERRITIN, TIBC, IRON, RETICCTPCT in the last 72 hours. Urinalysis    Component Value Date/Time   COLORURINE YELLOW 03/13/2019 0141   APPEARANCEUR CLEAR 03/13/2019 0141   LABSPEC >1.030 (H) 03/13/2019 0141   PHURINE 5.5 03/13/2019 0141   GLUCOSEU NEGATIVE 03/13/2019 0141   HGBUR TRACE (A) 03/13/2019 0141   BILIRUBINUR NEGATIVE 03/13/2019 0141   KETONESUR TRACE (A) 03/13/2019 0141   PROTEINUR >300 (A) 03/13/2019 0141   NITRITE NEGATIVE 03/13/2019 0141   LEUKOCYTESUR NEGATIVE 03/13/2019 0141   Sepsis Labs Invalid input(s): PROCALCITONIN,  WBC,  LACTICIDVEN Microbiology Recent Results (from the past 240 hour(s))  Respiratory Panel by RT PCR (Flu A&B, Covid) - Nasopharyngeal Swab     Status: None   Collection Time: 03/13/19  5:43 AM   Specimen: Nasopharyngeal Swab  Result Value Ref Range Status   SARS Coronavirus 2 by RT PCR NEGATIVE NEGATIVE Final    Comment: (NOTE) SARS-CoV-2 target nucleic acids are NOT DETECTED. The SARS-CoV-2 RNA is generally detectable in upper respiratoy specimens during the  acute phase of infection. The lowest concentration of SARS-CoV-2 viral copies this assay can detect is 131 copies/mL. A negative result does not preclude SARS-Cov-2 infection and should not be used as the sole basis for treatment or other patient management decisions. A negative result may occur with  improper specimen collection/handling, submission of specimen other than nasopharyngeal swab, presence of viral mutation(s) within the areas targeted by this assay, and inadequate number of viral copies (<131 copies/mL). A negative result must be combined with clinical observations, patient history, and epidemiological information. The expected result is Negative. Fact Sheet for Patients:  PinkCheek.be Fact Sheet for Healthcare Providers:  GravelBags.it This test is not yet ap proved or cleared by the [place] FDA and  has been authorized for detection and/or diagnosis of SARS-CoV-2 by FDA under an Emergency  Use Authorization (EUA). This EUA will remain  in effect (meaning this test can be used) for the duration of the COVID-19 declaration under Section 564(b)(1) of the Act, 21 U.S.C. section 360bbb-3(b)(1), unless the authorization is terminated or revoked sooner.    Influenza A by PCR NEGATIVE NEGATIVE Final   Influenza B by PCR NEGATIVE NEGATIVE Final    Comment: (NOTE) The Xpert Xpress SARS-CoV-2/FLU/RSV assay is intended as an aid in  the diagnosis of influenza from Nasopharyngeal swab specimens and  should not be used as a sole basis for treatment. Nasal washings and  aspirates are unacceptable for Xpert Xpress SARS-CoV-2/FLU/RSV  testing. Fact Sheet for Patients: PinkCheek.be Fact Sheet for Healthcare Providers: GravelBags.it This test is not yet approved or cleared by the [place] FDA and  has been authorized for detection and/or diagnosis of SARS-CoV-2  by  FDA under an Emergency Use Authorization (EUA). This EUA will remain  in effect (meaning this test can be used) for the duration of the  Covid-19 declaration under Section 564(b)(1) of the Act, 21  U.S.C. section 360bbb-3(b)(1), unless the authorization is  terminated or revoked. Performed at [facility], [address] [place], [place] [address]     Time coordinating discharge: Over 30 minutes  SIGNED:  Lorella Nimrod, MD  [facility] 03/17/2019, 10:59 AM  If 7PM-7AM, please contact night-coverage [email or web address]  This record has been created using Systems analyst. Errors have been sought and corrected,but may not always be located. Such creation errors do not reflect on the standard of care.

## 2019-03-17 NOTE — Progress Notes (Signed)
Subjective:  CC: Pamela Dodson is a 69 y.o. female  Hospital stay day 4,   Small bowel obstruction  HPI: No acute issues overnight.  Tolerating the diet.  Ready to be discharged.  ROS:  General: Denies weight loss, weight gain, fatigue, fevers, chills, and night sweats. Heart: Denies chest pain, palpitations, racing heart, irregular heartbeat, leg pain or swelling, and decreased activity tolerance. Respiratory: Denies breathing difficulty, shortness of breath, wheezing, cough, and sputum. GI: Denies change in appetite, heartburn, nausea, vomiting, constipation, diarrhea, and blood in stool. GU: Denies difficulty urinating, pain with urinating, urgency, frequency, blood in urine.   Objective:   Temp:  [98.2 F (36.8 C)-99 F (37.2 C)] 98.2 F (36.8 C) (03/13 0530) Pulse Rate:  [77-91] 77 (03/13 0530) Resp:  [18] 18 (03/13 0530) BP: (118-138)/(68-85) 118/68 (03/13 0530) SpO2:  [91 %-95 %] 91 % (03/13 0530)     Height: 5\' 3"  (160 cm) Weight: 86.2 kg BMI (Calculated): 33.67   Intake/Output this shift:  No intake or output data in the 24 hours ending 03/17/19 1456  Constitutional :  alert, cooperative, appears stated age and no distress  Respiratory:  clear to auscultation bilaterally  Cardiovascular:  regular rate and rhythm  Gastrointestinal: soft, non-tender; bowel sounds normal; no masses,  no organomegaly.   Skin: Cool and moist.   Psychiatric: Normal affect, non-agitated, not confused       LABS:  CMP Latest Ref Rng & Units 03/17/2019 03/16/2019 03/15/2019  Glucose 70 - 99 mg/dL 111(H) 137(H) 133(H)  BUN 8 - 23 mg/dL 7(L) 5(L) 8  Creatinine 0.44 - 1.00 mg/dL 0.57 0.58 0.62  Sodium 135 - 145 mmol/L 142 140 143  Potassium 3.5 - 5.1 mmol/L 3.7 3.2(L) 3.5  Chloride 98 - 111 mmol/L 105 101 100  CO2 22 - 32 mmol/L 28 31 32  Calcium 8.9 - 10.3 mg/dL 8.2(L) 8.2(L) 8.7(L)  Total Protein 6.5 - 8.1 g/dL - - -  Total Bilirubin 0.3 - 1.2 mg/dL - - -  Alkaline Phos 38 - 126 U/L -  - -  AST 15 - 41 U/L - - -  ALT 0 - 44 U/L - - -   CBC Latest Ref Rng & Units 03/14/2019 03/13/2019 01/06/2016  WBC 4.0 - 10.5 K/uL 6.4 10.3 5.4  Hemoglobin 12.0 - 15.0 g/dL 11.7(L) 14.3 13.8  Hematocrit 36.0 - 46.0 % 36.1 43.5 41.9  Platelets 150 - 400 K/uL 206 277 279    RADS: n/a Assessment:   SBO.  Resolved.  Preparing for discharge.  We discussed extensively the pathophysiology of a small bowel obstruction in her increased risk from recurrence due to her previous surgeries.  I explained to her that despite her overall risk being higher, the rate of recurrence necessarily does not correlate with the number of surgery she has had.  We discussed ED precautions and all questions were addressed.  Follow-up as needed.

## 2019-03-17 NOTE — Progress Notes (Signed)
Discharge order received. Patient mental status is at baseline. Vital signs stable . No signs of acute distress. Discharge instructions given. Patient verbalized understanding. No other issues noted at this time.   

## 2019-03-20 ENCOUNTER — Telehealth: Payer: Self-pay

## 2019-03-20 NOTE — Telephone Encounter (Signed)
Attempted to reach patient for TCM call and schedule hospital follow up appt. Left msg for patient to reach me at [phone] or call the office at [phone].

## 2019-03-21 NOTE — Telephone Encounter (Signed)
Transition Care Management Follow-up Telephone Call  Date of discharge and from where: 03/17/19 [facility]  How have you been since you were released from the hospital? Pt states she is doing well  Any questions or concerns? No   Items Reviewed:  Did the pt receive and understand the discharge instructions provided? Yes   Medications obtained and verified? Yes   Any new allergies since your discharge? No   Dietary orders reviewed? Yes  Do you have support at home? Yes   Functional Questionnaire: (I = Independent and D = Dependent) ADLs: I  Bathing/Dressing- I  Meal Prep- I  Eating- i  Maintaining continence- I  Transferring/Ambulation- I  Managing Meds- I  Follow up appointments reviewed:   PCP Hospital f/u appt confirmed? No  pt declines hospital follow up at this time as this is an issues she has dealt with previously and she is doing better now.   If their condition worsens, is the pt aware to call PCP or go to the Emergency Dept.? Yes  Was the patient provided with contact information for the PCP's office or ED? Yes  Was to pt encouraged to call back with questions or concerns? Yes

## 2019-05-05 HISTORY — PX: CARPAL TUNNEL RELEASE: SHX101

## 2019-05-28 ENCOUNTER — Other Ambulatory Visit: Payer: Self-pay

## 2019-05-28 ENCOUNTER — Telehealth: Payer: Self-pay | Admitting: Family Medicine

## 2019-05-28 DIAGNOSIS — R232 Flushing: Secondary | ICD-10-CM

## 2019-05-28 MED ORDER — VENLAFAXINE HCL ER 37.5 MG PO CP24: 37.5000 mg | ORAL_CAPSULE | Freq: Every day | ORAL | 0 refills | Status: DC

## 2019-05-28 NOTE — Chronic Care Management (AMB) (Signed)
  Chronic Care Management   Outreach Note  05/28/2019 Name: Pamela Dodson MRN: [id number] DOB: [birth year]  Pamela Dodson is a 69 y.o. year old female who is a primary care patient of Hubbard Hartshorn, FNP. I reached out to Pamela Dodson by phone today in response to a referral sent by Ms. Cherene Altes [employer]'s health plan.     An unsuccessful telephone outreach was attempted today. The patient was referred to the case management team for assistance with care management and care coordination.   Follow Up Plan: A HIPPA compliant phone message was left for the patient providing contact information and requesting a return call.  The care management team will reach out to the patient again over the next 7 days.  If patient returns call to provider office, please advise to call [facility]  at [place], [place], [place], [place] [address] Direct Dial: [phone] [email or web address]: [email or web address]

## 2019-06-05 NOTE — Chronic Care Management (AMB) (Signed)
  Chronic Care Management   Outreach Note  06/05/2019 Name: Pamela Dodson MRN: [id number] DOB: December 18, 1950  Pamela Dodson is a 69 y.o. year old female who is a primary care patient of Hubbard Hartshorn, FNP. I reached out to Pamela Dodson by phone today in response to a referral sent by Ms. Cherene Altes [place]'s health plan.     A second unsuccessful telephone outreach was attempted today. The patient was referred to the case management team for assistance with care management and care coordination.   Follow Up Plan: A HIPPA compliant phone message was left for the patient providing contact information and requesting a return call.  The care management team will reach out to the patient again over the next 7 days.  If patient returns call to provider office, please advise to call [facility] at [place], [place], [place], [place] [address] Direct Dial: [phone] [email or web address]: [email or web address]

## 2019-06-08 NOTE — Chronic Care Management (AMB) (Signed)
  Chronic Care Management   Outreach Note  06/08/2019 Name: [name] MRN: [id number] DOB: Mar 22, 1950  Pamela Dodson is a 69 y.o. year old female who is a primary care patient of Hubbard Hartshorn, FNP. I reached out to Pamela Dodson by phone today in response to a referral sent by Ms. Cherene Altes [employer]'s health plan.     Third unsuccessful telephone outreach was attempted today. The patient was referred to the case management team for assistance with care management and care coordination. The patient's primary care provider has been notified of our unsuccessful attempts to make or maintain contact with the patient. The care management team is pleased to engage with this patient at any time in the future should he/she be interested in assistance from the care management team.   Follow Up Plan: The care management team is available to follow up with the patient after provider conversation with the patient regarding recommendation for care management engagement and subsequent re-referral to the care management team.   Noreene Larsson, [place], [place], [place] [address] Direct Dial: [phone] [email or web address]

## 2019-06-15 ENCOUNTER — Other Ambulatory Visit: Payer: Self-pay

## 2019-06-15 DIAGNOSIS — E7800 Pure hypercholesterolemia, unspecified: Secondary | ICD-10-CM

## 2019-06-15 MED ORDER — ATORVASTATIN CALCIUM 80 MG PO TABS: 80.0000 mg | ORAL_TABLET | Freq: Every day | ORAL | 3 refills | Status: DC

## 2019-06-21 LAB — HM DIABETES EYE EXAM

## 2019-07-13 ENCOUNTER — Ambulatory Visit (INDEPENDENT_AMBULATORY_CARE_PROVIDER_SITE_OTHER): Payer: Medicare Other | Admitting: Family Medicine

## 2019-07-13 ENCOUNTER — Other Ambulatory Visit: Payer: Self-pay

## 2019-07-13 ENCOUNTER — Encounter: Payer: Self-pay | Admitting: Family Medicine

## 2019-07-13 VITALS — BP 116/72 | HR 99 | Temp 97.5°F | Resp 14 | Ht 63.0 in | Wt 196.3 lb

## 2019-07-13 DIAGNOSIS — Z5181 Encounter for therapeutic drug level monitoring: Secondary | ICD-10-CM

## 2019-07-13 DIAGNOSIS — I10 Essential (primary) hypertension: Secondary | ICD-10-CM

## 2019-07-13 DIAGNOSIS — E890 Postprocedural hypothyroidism: Secondary | ICD-10-CM

## 2019-07-13 DIAGNOSIS — N182 Chronic kidney disease, stage 2 (mild): Secondary | ICD-10-CM | POA: Diagnosis not present

## 2019-07-13 DIAGNOSIS — E7800 Pure hypercholesterolemia, unspecified: Secondary | ICD-10-CM

## 2019-07-13 DIAGNOSIS — R232 Flushing: Secondary | ICD-10-CM

## 2019-07-13 MED ORDER — LEVOTHYROXINE SODIUM 175 MCG PO TABS: 175.0000 ug | ORAL_TABLET | Freq: Every day | ORAL | 3 refills | Status: DC

## 2019-07-13 MED ORDER — VENLAFAXINE HCL ER 37.5 MG PO CP24: 37.5000 mg | ORAL_CAPSULE | Freq: Every day | ORAL | 3 refills | Status: DC

## 2019-07-13 NOTE — Progress Notes (Signed)
Name: Pamela Dodson   MRN: [id number]    DOB: 04-08-1950   Date:07/13/2019       Progress Note  Chief Complaint  Patient presents with  . Follow-up  . Hypothyroidism  . Hyperlipidemia  . Depression  . Hypertension  . Medication Refill     Subjective:   Pamela Dodson is a 69 y.o. female, presents to clinic for   Hypertension:  Currently managed on amlodipine-benazepril 10-40 Pt reports good med compliance and denies any SE.  No lightheadedness, hypotension, syncope. Blood pressure today is well controlled. BP Readings from Last 3 Encounters:  07/13/19 116/72  03/17/19 118/68  03/27/18 116/60   Pt denies CP, SOB, exertional sx, LE edema, palpitation, Ha's, visual disturbance  Hypothyroid, on 175 mcg levothyroxine, postsurgical Lab Results  Component Value Date   TSH 0.71 03/09/2019  Last labs normal, no need to recheck today, pt denies  Hyperlipidemia: Currently treated with lipitor 80 mg, pt reports good med compliance Last Lipids: Lab Results  Component Value Date   CHOL 155 03/09/2019   HDL 42 (L) 03/09/2019   LDLCALC 87 03/09/2019   TRIG 163 (H) 03/09/2019   CHOLHDL 3.7 03/09/2019   - Denies: Chest pain, shortness of breath, myalgias, claudication  On effexor for some time for hot flashes and sweats, low dose, tolerates well, no SE or concerns, she is also taking black cohosh.  She rarely has sx, wonders how long she'll need to be on meds, discussed stopping or weaning, she will continue the same for now.   Current Outpatient Medications:  .  amLODipine-benazepril (LOTREL) 10-40 MG capsule, Take 1 capsule by mouth at bedtime. , Disp: , Rfl:  .  Ascorbic Acid (VITAMIN C) 100 MG tablet, Take 100 mg by mouth daily., Disp: , Rfl:  .  atorvastatin (LIPITOR) 80 MG tablet, Take 1 tablet (80 mg total) by mouth daily., Disp: 90 tablet, Rfl: 3 .  Black Cohosh 160 MG CAPS, Take by mouth., Disp: , Rfl:  .  Cholecalciferol 1000 UNITS capsule, Take 1 capsule by  mouth daily., Disp: , Rfl:  .  levothyroxine (SYNTHROID) 175 MCG tablet, Take 1 tablet (175 mcg total) by mouth daily before breakfast., Disp: 90 tablet, Rfl: 3 .  Multiple Vitamin (MULTIVITAMIN) tablet, Take 1 tablet by mouth daily., Disp: , Rfl:  .  Omega-3 Fatty Acids (FISH OIL) 1000 MG CPDR, Take 1 capsule by mouth 2 (two) times daily., Disp: , Rfl:  .  venlafaxine XR (EFFEXOR-XR) 37.5 MG 24 hr capsule, Take 1 capsule (37.5 mg total) by mouth daily with breakfast., Disp: 90 capsule, Rfl: 0 .  loratadine (CLARITIN) 10 MG tablet, Take 10 mg by mouth daily. (Patient not taking: Reported on 07/13/2019), Disp: , Rfl:   Patient Active Problem List   Diagnosis Date Noted  . SBO (small bowel obstruction) ([facility]) 03/13/2019  . History of resection of large bowel 03/13/2019  . Small bowel obstruction ([facility]) 03/13/2019  . Aortic atherosclerosis ([facility]) 03/13/2019  . Coronary atherosclerosis 03/13/2019  . Benign essential hypertension 10/10/2018  . Chronic kidney disease, stage II (mild) 10/10/2018  . 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 Obesity (BMI 30.0-34.9) 01/16/2018  . Proteinuria 07/15/2017  . Urine test positive for microalbuminuria 07/15/2017  . Prediabetes 07/12/2016  . Abnormal mammogram of left breast 07/21/2015  . Postsurgical intestinal bypass or anastomosis status   . Hypertension goal BP (blood pressure) < 150/90 08/07/2014  . Gastro-esophageal reflux disease without esophagitis 08/07/2014  . Hot flash, menopausal  08/07/2014  . Lumbar disc disease with radiculopathy 08/07/2014  . Dysmetabolic syndrome 24/40/1027  . Post-surgical hypothyroidism 08/07/2014  . Generalized OA 08/07/2014  . Hypercholesterolemia without hypertriglyceridemia 08/07/2014  . Hernia of anterior abdominal wall 08/07/2014  . Vestibular neuronitis 08/07/2014  . Arthritis of hip 12/16/2010    Past Surgical History:  Procedure Laterality Date  . APPENDECTOMY    . CARPAL TUNNEL RELEASE Left 2016  . CARPAL TUNNEL  RELEASE Right 05/05/2019  . CESAREAN SECTION    . CHOLECYSTECTOMY    . COLON SURGERY     diverticulitic mass removed  . COLONOSCOPY WITH PROPOFOL N/A 08/15/2014   Procedure: COLONOSCOPY WITH PROPOFOL;  Surgeon: Lucilla Lame, MD;  Location: [facility];  Service: Endoscopy;  Laterality: N/A;  . GANGLION CYST EXCISION     L wrist  . GLOMUS TUMOR EXCISION Left 12/12/2017  . JOINT REPLACEMENT    . knee arthroscopy    . THYROIDECTOMY  2009  . TOTAL HIP ARTHROPLASTY  12/16/2010   Procedure: TOTAL HIP ARTHROPLASTY;  Surgeon: Kerin Salen;  Location: [facility];  Service: Orthopedics;  Laterality: Left;  [place]  . TOTAL KNEE ARTHROPLASTY  2009    Family History  Problem Relation Age of Onset  . Cancer Mother        lung  . Myelodysplastic syndrome Mother   . Hypothyroidism Mother   . Hypertension Brother   . Stroke Brother   . Atrial fibrillation Brother   . Birth defects Son        congenital genetic abnormality  . Dementia Maternal Grandfather   . Heart disease Paternal Grandmother        heart disease in her 11's  . Stroke Paternal Grandmother   . Heart attack Paternal Grandfather   . Heart disease Paternal Aunt   . Heart attack Paternal Aunt   . Stroke Paternal Aunt   . Atrial fibrillation Paternal Uncle     Social History   Tobacco Use  . Smoking status: Never Smoker  . Smokeless tobacco: Never Used  Vaping Use  . Vaping Use: Never used  Substance Use Topics  . Alcohol use: Yes    Alcohol/week: 1.0 standard drink    Types: 1 Glasses of wine per week    Comment: occasional  . Drug use: No     No Known Allergies  Health Maintenance  Topic Date Due  . TETANUS/TDAP  07/29/2020 (Originally 01/05/2019)  . INFLUENZA VACCINE  08/05/2019  . DEXA SCAN  08/06/2019  . MAMMOGRAM  05/21/2020  . COLONOSCOPY  08/14/2024  . COVID-19 Vaccine  Completed  . Hepatitis C Screening  Completed  . PNA vac Low Risk Adult  Completed    Chart Review Today: I personally reviewed  active problem list, medication list, allergies, family history, social history, health maintenance, notes from last encounter, lab results, imaging with the patient/caregiver today.   Review of Systems  10 Systems reviewed and are negative for acute change except as noted in the HPI.    Objective:   Vitals:   07/13/19 1100  BP: 116/72  Pulse: 99  Resp: 14  Temp: (!) 97.5 F (36.4 C)  SpO2: 99%  Weight: 196 lb 4.8 oz (89 kg)  Height: 5\' 3"  (1.6 m)    Body mass index is 34.77 kg/m.  Physical Exam Vitals and nursing note reviewed.  Constitutional:      General: She is not in acute distress.    Appearance: Normal appearance. She is well-developed.  She is obese. She is not ill-appearing, toxic-appearing or diaphoretic.     Interventions: Face mask in place.     Comments: Well appearing, pleasant, looks stated age  HENT:     Head: Normocephalic and atraumatic.     Right Ear: External ear normal.     Left Ear: External ear normal.  Eyes:     General: Lids are normal. No scleral icterus.       Right eye: No discharge.        Left eye: No discharge.     Conjunctiva/sclera: Conjunctivae normal.  Neck:     Trachea: Phonation normal. No tracheal deviation.  Cardiovascular:     Rate and Rhythm: Normal rate and regular rhythm.     Pulses: Normal pulses.          Radial pulses are 2+ on the right side and 2+ on the left side.       Posterior tibial pulses are 2+ on the right side and 2+ on the left side.     Heart sounds: Normal heart sounds. No murmur heard.  No friction rub. No gallop.   Pulmonary:     Effort: Pulmonary effort is normal. No respiratory distress.     Breath sounds: Normal breath sounds. No stridor. No wheezing, rhonchi or rales.  Chest:     Chest wall: No tenderness.  Abdominal:     General: Bowel sounds are normal. There is no distension.     Palpations: Abdomen is soft.  Musculoskeletal:     Cervical back: Normal range of motion and neck supple.      Right lower leg: No edema.     Left lower leg: No edema.  Skin:    General: Skin is warm and dry.     Coloration: Skin is not jaundiced or pale.     Findings: No rash.  Neurological:     Mental Status: She is alert.     Motor: No abnormal muscle tone.     Gait: Gait normal.  Psychiatric:        Mood and Affect: Mood normal.        Speech: Speech normal.        Behavior: Behavior normal.         Assessment & Plan:   1. Hypothyroidism, postop No sx concerning for chemical hyper or hypothyroid - labs recently done and normal, refills ordered, will check TSH annually or sooner if any sx - levothyroxine (SYNTHROID) 175 MCG tablet; Take 1 tablet (175 mcg total) by mouth daily before breakfast.  Dispense: 90 tablet; Refill: 3  2. Hypercholesterolemia without hypertriglyceridemia Reviewed recent labs. Compliant with meds, no SE, no myalgias, fatigue or jaundice  3. Hypertension goal BP (blood pressure) < 150/90 Stable and well controlled on current meds, reviewed recent nephrology visit, plan and labs - no changes to med, tolerating well, no SE, and excellent renal function - pt will continue to see twice a year.  4. Chronic kidney disease, stage II (mild) See above  5. Medication monitoring encounter Doing well with all meds, reviewed meds and dosing, reviewed specialists visits and refills sent on needed meds  6. Hot flashes Discussion about menopausal sx and tx, she expressed interest in discontinuing, but ultimately decided to continue what she is currently on. - venlafaxine XR (EFFEXOR-XR) 37.5 MG 24 hr capsule; Take 1 capsule (37.5 mg total) by mouth daily with breakfast.  Dispense: 90 capsule; Refill: 3    Return in about 6  months (around 01/13/2020).   Delsa Grana, PA-C 07/13/19 11:13 AM

## 2019-07-30 ENCOUNTER — Ambulatory Visit: Payer: Medicare Other | Admitting: Family Medicine

## 2019-11-15 ENCOUNTER — Ambulatory Visit: Payer: Medicare Other

## 2019-12-10 ENCOUNTER — Ambulatory Visit: Payer: Medicare Other | Admitting: Family Medicine

## 2019-12-11 ENCOUNTER — Encounter: Payer: Self-pay | Admitting: Family Medicine

## 2019-12-11 ENCOUNTER — Ambulatory Visit (INDEPENDENT_AMBULATORY_CARE_PROVIDER_SITE_OTHER): Payer: Medicare Other | Admitting: Family Medicine

## 2019-12-11 ENCOUNTER — Other Ambulatory Visit: Payer: Self-pay

## 2019-12-11 VITALS — BP 124/76 | HR 96 | Temp 98.3°F | Resp 16 | Ht 63.0 in | Wt 196.5 lb

## 2019-12-11 DIAGNOSIS — I700 Atherosclerosis of aorta: Secondary | ICD-10-CM

## 2019-12-11 DIAGNOSIS — R7303 Prediabetes: Secondary | ICD-10-CM

## 2019-12-11 DIAGNOSIS — Z780 Asymptomatic menopausal state: Secondary | ICD-10-CM

## 2019-12-11 DIAGNOSIS — N951 Menopausal and female climacteric states: Secondary | ICD-10-CM

## 2019-12-11 DIAGNOSIS — I10 Essential (primary) hypertension: Secondary | ICD-10-CM

## 2019-12-11 DIAGNOSIS — E890 Postprocedural hypothyroidism: Secondary | ICD-10-CM | POA: Diagnosis not present

## 2019-12-11 DIAGNOSIS — N182 Chronic kidney disease, stage 2 (mild): Secondary | ICD-10-CM | POA: Diagnosis not present

## 2019-12-11 DIAGNOSIS — Z23 Encounter for immunization: Secondary | ICD-10-CM

## 2019-12-11 DIAGNOSIS — E669 Obesity, unspecified: Secondary | ICD-10-CM

## 2019-12-11 DIAGNOSIS — E7800 Pure hypercholesterolemia, unspecified: Secondary | ICD-10-CM

## 2019-12-11 DIAGNOSIS — R809 Proteinuria, unspecified: Secondary | ICD-10-CM

## 2019-12-11 MED ORDER — ZOSTER VAC RECOMB ADJUVANTED 50 MCG/0.5ML IM SUSR: 0.5000 mL | Freq: Once | INTRAMUSCULAR | 1 refills | Status: AC

## 2019-12-11 NOTE — Progress Notes (Signed)
Name: Pamela Dodson   MRN: [id number]    DOB: [birth year]   Date:12/11/2019       Progress Note  Chief Complaint  Patient presents with  . Hyperlipidemia    follow up  . Hypothyroidism  . Depression  . Hypertension    Dr.Lateef follows     Subjective:   Pamela Dodson is a 69 y.o. female, presents to clinic for routine f/up  Hypertension:  Currently managed on amlodipine-benazepril 10-40 - managed by Nephrology Dr. Holley Raring, changed a few months ago for proteinuria Pt reports good med compliance and denies any SE.   Blood pressure today is well controlled. BP Readings from Last 3 Encounters:  12/11/19 124/76  07/13/19 116/72  03/17/19 118/68   Pt denies CP, SOB, exertional sx, LE edema, palpitation, Ha's, visual disturbances, lightheadedness, hypotension, syncope. Dietary efforts for BP?  Healthy  Avoiding NSAIDs  Hypothyroidism: Current Medication Regimen: 175 mcg synthroid Takes meds correctly, feels tired, but not sleeping well due to hip pain, pending total hip replacement surgery next months Current Symptoms: denies weight changes, heat/cold intolerance, bowel/skin changes or CVS symptoms  Most recent results are below; we will be repeating labs today. Lab Results  Component Value Date   TSH 0.71 03/09/2019    On effexor 37.5 mg for hot flashes, mood good Depression screen [facility] 2/9 12/11/2019 12/11/2019 07/13/2019  Decreased Interest 0 0 0  Down, Depressed, Hopeless 0 0 0  PHQ - 2 Score 0 0 0  Altered sleeping 0 - 0  Tired, decreased energy 0 - 0  Change in appetite 0 - 0  Feeling bad or failure about yourself  0 - 0  Trouble concentrating 0 - 0  Moving slowly or fidgety/restless 0 - 0  Suicidal thoughts 0 - 0  PHQ-9 Score 0 - 0  Difficult doing work/chores Not difficult at all - Not difficult at all  Some recent data might be hidden   HLD - on lipitor 80 mg fish oil supplement 1000 mg BID Lab Results  Component Value Date   CHOL 155 03/09/2019   HDL  42 (L) 03/09/2019   LDLCALC 87 03/09/2019   TRIG 163 (H) 03/09/2019   CHOLHDL 3.7 03/09/2019  doing well, good med compliance, no SE or concerns - will do labs now and repeat annually   CKD/proteinuria - reviewed last Nephrology visit and labs from this week          Current Outpatient Medications:  .  amLODipine-benazepril (LOTREL) 10-40 MG capsule, Take 1 capsule by mouth at bedtime. , Disp: , Rfl:  .  Ascorbic Acid (VITAMIN C) 100 MG tablet, Take 100 mg by mouth daily., Disp: , Rfl:  .  atorvastatin (LIPITOR) 80 MG tablet, Take 1 tablet (80 mg total) by mouth daily., Disp: 90 tablet, Rfl: 3 .  Black Cohosh 160 MG CAPS, Take by mouth., Disp: , Rfl:  .  Cholecalciferol 1000 UNITS capsule, Take 1 capsule by mouth daily., Disp: , Rfl:  .  levothyroxine (SYNTHROID) 175 MCG tablet, Take 1 tablet (175 mcg total) by mouth daily before breakfast., Disp: 90 tablet, Rfl: 3 .  Multiple Vitamin (MULTIVITAMIN) tablet, Take 1 tablet by mouth daily., Disp: , Rfl:  .  Omega-3 Fatty Acids (FISH OIL) 1000 MG CPDR, Take 1 capsule by mouth 2 (two) times daily., Disp: , Rfl:  .  venlafaxine XR (EFFEXOR-XR) 37.5 MG 24 hr capsule, Take 1 capsule (37.5 mg total) by mouth daily with breakfast., Disp: 90  capsule, Rfl: 3 .  loratadine (CLARITIN) 10 MG tablet, Take 10 mg by mouth daily. (Patient not taking: Reported on 07/13/2019), Disp: , Rfl:   Patient Active Problem List   Diagnosis Date Noted  . SBO (small bowel obstruction) ([place]) 03/13/2019  . History of resection of large bowel 03/13/2019  . Small bowel obstruction ([place]) 03/13/2019  . Aortic atherosclerosis ([place]) 03/13/2019  . Coronary atherosclerosis 03/13/2019  . Benign essential hypertension 10/10/2018  . Chronic kidney disease, stage II (mild) 10/10/2018  . 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 Obesity (BMI 30.0-34.9) 01/16/2018  . Proteinuria 07/15/2017  . Urine test positive for microalbuminuria 07/15/2017  . Prediabetes 07/12/2016  . Abnormal  mammogram of left breast 07/21/2015  . Postsurgical intestinal bypass or anastomosis status   . Hypertension goal BP (blood pressure) < 150/90 08/07/2014  . Gastro-esophageal reflux disease without esophagitis 08/07/2014  . Hot flash, menopausal 08/07/2014  . Lumbar disc disease with radiculopathy 08/07/2014  . Dysmetabolic syndrome 16/10/9602  . Post-surgical hypothyroidism 08/07/2014  . Generalized OA 08/07/2014  . Hypercholesterolemia without hypertriglyceridemia 08/07/2014  . Hernia of anterior abdominal wall 08/07/2014  . Vestibular neuronitis 08/07/2014  . Arthritis of hip 12/16/2010    Past Surgical History:  Procedure Laterality Date  . APPENDECTOMY    . CARPAL TUNNEL RELEASE Left 2016  . CARPAL TUNNEL RELEASE Right 05/05/2019  . CESAREAN SECTION    . CHOLECYSTECTOMY    . COLON SURGERY     diverticulitic mass removed  . COLONOSCOPY WITH PROPOFOL N/A 08/15/2014   Procedure: COLONOSCOPY WITH PROPOFOL;  Surgeon: Lucilla Lame, MD;  Location: [facility];  Service: Endoscopy;  Laterality: N/A;  . GANGLION CYST EXCISION     L wrist  . GLOMUS TUMOR EXCISION Left 12/12/2017  . JOINT REPLACEMENT    . knee arthroscopy    . THYROIDECTOMY  2009  . TOTAL HIP ARTHROPLASTY  12/16/2010   Procedure: TOTAL HIP ARTHROPLASTY;  Surgeon: Kerin Salen;  Location: [place];  Service: Orthopedics;  Laterality: Left;  Depuy  . TOTAL KNEE ARTHROPLASTY  2009    Family History  Problem Relation Age of Onset  . Cancer Mother        lung  . Myelodysplastic syndrome Mother   . Hypothyroidism Mother   . Hypertension Brother   . Stroke Brother   . Atrial fibrillation Brother   . Birth defects Son        congenital genetic abnormality  . Dementia Maternal Grandfather   . Heart disease Paternal Grandmother        heart disease in her 73's  . Stroke Paternal Grandmother   . Heart attack Paternal Grandfather   . Heart disease Paternal Aunt   . Heart attack Paternal Aunt   . Stroke  Paternal Aunt   . Atrial fibrillation Paternal Uncle     Social History   Tobacco Use  . Smoking status: Never Smoker  . Smokeless tobacco: Never Used  Vaping Use  . Vaping Use: Never used  Substance Use Topics  . Alcohol use: Yes    Alcohol/week: 1.0 standard drink    Types: 1 Glasses of wine per week    Comment: occasional  . Drug use: No     No Known Allergies  Health Maintenance  Topic Date Due  . DEXA SCAN  08/06/2019  . TETANUS/TDAP  07/29/2020 (Originally 01/05/2019)  . MAMMOGRAM  05/21/2020  . COLONOSCOPY  08/14/2024  . INFLUENZA VACCINE  Completed  . COVID-19 Vaccine  Completed  .  Hepatitis C Screening  Completed  . PNA vac Low Risk Adult  Completed    Chart Review Today: I personally reviewed active problem list, medication list, allergies, family history, social history, health maintenance, notes from last encounter, lab results, imaging with the patient/caregiver today.   Review of Systems  10 Systems reviewed and are negative for acute change except as noted in the HPI.  Objective:   Vitals:   12/11/19 1150  BP: 124/76  Pulse: 96  Resp: 16  Temp: 98.3 F (36.8 C)  TempSrc: Oral  SpO2: 97%  Weight: 196 lb 8 oz (89.1 kg)  Height: 5\' 3"  (1.6 m)    Body mass index is 34.81 kg/m.  Physical Exam Vitals and nursing note reviewed.  Constitutional:      General: She is not in acute distress.    Appearance: Normal appearance. She is well-developed. She is obese. She is not ill-appearing, toxic-appearing or diaphoretic.  HENT:     Head: Normocephalic and atraumatic.     Right Ear: External ear normal.     Left Ear: External ear normal.  Eyes:     General: No scleral icterus.       Right eye: No discharge.        Left eye: No discharge.     Conjunctiva/sclera: Conjunctivae normal.  Neck:     Trachea: No tracheal deviation.  Cardiovascular:     Rate and Rhythm: Normal rate and regular rhythm.     Pulses: Normal pulses.     Heart sounds:  Normal heart sounds. No murmur heard.  No friction rub.  Pulmonary:     Effort: Pulmonary effort is normal. No respiratory distress.     Breath sounds: Normal breath sounds. No stridor. No wheezing, rhonchi or rales.  Abdominal:     General: Bowel sounds are normal.     Palpations: Abdomen is soft.  Musculoskeletal:     Right lower leg: No edema.     Left lower leg: No edema.  Skin:    General: Skin is warm and dry.     Coloration: Skin is not pale.     Findings: No rash.  Neurological:     Mental Status: She is alert. Mental status is at baseline.     Motor: No abnormal muscle tone.  Psychiatric:        Mood and Affect: Mood normal.        Behavior: Behavior normal.         Assessment & Plan:     ICD-10-CM   1. Hypothyroidism, postop  E89.0 CBC with Differential/Platelet    TSH   Patient notes fatigue but no other chemical hypothyroid symptoms getting poor sleep due to pain, recheck labs, ensure no concerning change in TSH  2. Hypercholesterolemia without hypertriglyceridemia  E78.00 Lipid panel   on statin and fish oil supplement, good compliance, no SE or concerns, lipid panel today  3. Primary hypertension  I10    stable, well controlled with amlodipine-benazapril 10-40, BP at goal today, avoiding NSAIDs for nephropathy/proteinuria, good GFR  4. Chronic kidney disease, stage II (mild)  N18.2    see below  5. Proteinuria, unspecified type  R80.9    per nephrology, no NSAIDs  6. Aortic atherosclerosis (HCC) Chronic I70.0    On statin, monitoring  7. Obesity (BMI 30.0-34.9)  E66.9    Weight stable, limited mobility right now with right hip pain, getting hip replacement  8. Prediabetes  R73.03    Last  labs 5.7 we will recheck at next routine office visit  9. Hot flash, menopausal  N95.1    Symptoms well controlled on Effexor 37.5 mg  10. Need for shingles vaccine  Z23 Zoster Vaccine [facility]) injection   Discussed vaccine, sent to local pharmacy  11.  Postmenopausal estrogen deficiency  Z78.0 DG Bone Density   Due for repeat bone density, continue vitamin D and calcium supplements   Health Maintenance  Topic Date Due  . DEXA scan (bone density measurement)  08/06/2019  . Tetanus Vaccine  07/29/2020*  . Mammogram  05/21/2020  . Colon Cancer Screening  08/14/2024  . Flu Shot  Completed  . COVID-19 Vaccine  Completed  .  Hepatitis C: One time screening is recommended by Center for Disease Control  (CDC) for  adults born from [birth year] through 1966.   Completed  . Pneumonia vaccines  Completed  *Topic was postponed. The date shown is not the original due date.   Dexa reviewed - done at outside imaging - due for recheck - printed order and will fax to facility.  Return in about 6 months (around 06/10/2020) for Routine follow-up.   Delsa Grana, PA-C 12/11/19 12:07 PM

## 2019-12-12 LAB — CBC WITH DIFFERENTIAL/PLATELET
Absolute Monocytes: 431 cells/uL (ref 200–950)
Basophils Absolute: 28 cells/uL (ref 0–200)
Basophils Relative: 0.5 %
Eosinophils Absolute: 174 cells/uL (ref 15–500)
Eosinophils Relative: 3.1 %
HCT: 40.3 % (ref 35.0–45.0)
Hemoglobin: 13.4 g/dL (ref 11.7–15.5)
Lymphs Abs: 1058 cells/uL (ref 850–3900)
MCH: 31.5 pg (ref 27.0–33.0)
MCHC: 33.3 g/dL (ref 32.0–36.0)
MCV: 94.8 fL (ref 80.0–100.0)
MPV: 10.9 fL (ref 7.5–12.5)
Monocytes Relative: 7.7 %
Neutro Abs: 3909 cells/uL (ref 1500–7800)
Neutrophils Relative %: 69.8 %
Platelets: 248 10*3/uL (ref 140–400)
RBC: 4.25 10*6/uL (ref 3.80–5.10)
RDW: 13.2 % (ref 11.0–15.0)
Total Lymphocyte: 18.9 %
WBC: 5.6 10*3/uL (ref 3.8–10.8)

## 2019-12-12 LAB — TSH: TSH: 1.94 mIU/L (ref 0.40–4.50)

## 2019-12-12 LAB — LIPID PANEL
Cholesterol: 136 mg/dL (ref <200)
HDL: 47 mg/dL — ABNORMAL LOW (ref >=50)
LDL Cholesterol (Calc): 68 mg/dL (calc)
Non-HDL Cholesterol (Calc): 89 mg/dL (calc) (ref <130)
Total CHOL/HDL Ratio: 2.9 (calc) (ref <5.0)
Triglycerides: 130 mg/dL (ref <150)

## 2019-12-13 ENCOUNTER — Telehealth: Payer: Self-pay | Admitting: Family Medicine

## 2019-12-13 NOTE — Telephone Encounter (Signed)
Patient returning [facility] call regarding lab results and stats if PCP would like to increase synthroid if she thinks it would help with her energy, patient iokay to increase or leave as is, please advise.   Patient states she is having hip replacement surgery in 01/2020 and would like to put off her bone density until March/May. Please place bone density orders with  [facility] # [address], [place], [place] [address] [phone]

## 2019-12-13 NOTE — Telephone Encounter (Signed)
Order faxed to [facility]. Sent note not to schedule til February

## 2020-01-01 ENCOUNTER — Encounter: Payer: Self-pay | Admitting: Orthopedic Surgery

## 2020-01-01 ENCOUNTER — Other Ambulatory Visit: Payer: Self-pay | Admitting: Orthopedic Surgery

## 2020-01-01 NOTE — H&P (Signed)
NAME: Pamela Dodson MRN:   [id number] DOB:   11-09-1950     HISTORY AND PHYSICAL  CHIEF COMPLAINT:  Right hip pain  HISTORY:   Pamela Colonna Laroccois a 69 y.o. female  with right  Hip Pain Patient complains of right hip pain. Onset of the symptoms was several years ago. Inciting event: known DJD. The patient reports the hip pain is worse with weight bearing. Associated symptoms: none. Aggravating symptoms include: any weight bearing and going up and down stairs. Patient has had no prior hip problems. Previous visits for this problem: yes, last seen several weeks ago by me. Evaluation to date: plain films, which were abnormal  osteoarthritis. Treatment to date: OTC analgesics, which have been somewhat effective, prescription analgesics, which have been somewhat effective, home exercise program, which has been somewhat effective and physical therapy, which has been somewhat effective. Plan for right total hip arthroplasty  PAST MEDICAL HISTORY:   Past Medical History:  Diagnosis Date  . Arthritis   . Blood transfusion   . Cancer (HCC)    hx thyroid cancer  . Full code status 11/02/2016  . GERD (gastroesophageal reflux disease)   . 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 Latrelle Dodrill, II in [place]  . H/O malignant neoplasm of thyroid 08/07/2014   S/P resection x2   . History of knee replacement 08/07/2014  . Hypertension   . Hypothyroidism   . Neuromuscular disorder (HCC)    numbnes lower legs s/p knee replacements  . PONV (postoperative nausea and vomiting)    usually needs zofran/antiemetic prior to surgery  . Shingles rash 01/22/2015  . Thyroid cancer (HCC)   . Wears contact lenses     PAST SURGICAL HISTORY:   Past Surgical History:  Procedure Laterality Date  . APPENDECTOMY    . CARPAL TUNNEL RELEASE Left 2016  . CARPAL TUNNEL RELEASE Right 05/05/2019  . CESAREAN SECTION    . CHOLECYSTECTOMY    . COLON SURGERY     diverticulitic mass removed  . COLONOSCOPY WITH PROPOFOL N/A 08/15/2014    Procedure: COLONOSCOPY WITH PROPOFOL;  Surgeon: Midge Minium, MD;  [facility];  Service: Endoscopy;  Laterality: N/A;  . GANGLION CYST EXCISION     L wrist  . GLOMUS TUMOR EXCISION Left 12/12/2017  . JOINT REPLACEMENT    . knee arthroscopy    . THYROIDECTOMY  2009  . TOTAL HIP ARTHROPLASTY  12/16/2010   Procedure: TOTAL HIP ARTHROPLASTY;  Surgeon: Nestor Lewandowsky;  Location: [facility];  Service: Orthopedics;  Laterality: Left;  Depuy  . TOTAL KNEE ARTHROPLASTY  2009    MEDICATIONS:  (Not in a hospital admission)   ALLERGIES:  No Known Allergies  REVIEW OF SYSTEMS:   Negative except HPI  FAMILY HISTORY:   Family History  Problem Relation Age of Onset  . Cancer Mother        lung  . Myelodysplastic syndrome Mother   . Hypothyroidism Mother   . Hypertension Brother   . Stroke Brother   . Atrial fibrillation Brother   . Birth defects Son        congenital genetic abnormality  . Dementia Maternal Grandfather   . Heart disease Paternal Grandmother        heart disease in her 57's  . Stroke Paternal Grandmother   . Heart attack Paternal Grandfather   . Heart disease Paternal Aunt   . Heart attack Paternal Aunt   . Stroke Paternal Aunt   . Atrial  fibrillation Paternal Uncle     SOCIAL HISTORY:   reports that she has never smoked. She has never used smokeless tobacco. She reports current alcohol use of about 1.0 standard drink of alcohol per week. She reports that she does not use drugs.  PHYSICAL EXAM:  General appearance: alert, cooperative and no distress Neck: no JVD and supple, symmetrical, trachea midline Resp: clear to auscultation bilaterally Cardio: regular rate and rhythm, S1, S2 normal, no murmur, click, rub or gallop GI: soft, non-tender; bowel sounds normal; no masses,  no organomegaly Extremities: extremities normal, atraumatic, no cyanosis or edema and Homans sign is negative, no sign of DVT Pulses: 2+ and symmetric Skin: Skin color, texture,  turgor normal. No rashes or lesions Neurologic: Alert and oriented X 3, normal strength and tone. Normal symmetric reflexes. Normal coordination and gait    LABORATORY STUDIES: No results for input(s): WBC, HGB, HCT, PLT in the last 72 hours.  No results for input(s): NA, K, CL, CO2, GLUCOSE, BUN, CREATININE, CALCIUM in the last 72 hours.  STUDIES/RESULTS:  No results found.  ASSESSMENT:  End stage osteoarthritis right hip        Active Problems:   * No active hospital problems. *    PLAN:  Right Primary Total Hip   [name] 01/01/2020. 11:34 AM

## 2020-01-02 ENCOUNTER — Encounter
Admission: RE | Admit: 2020-01-02 | Discharge: 2020-01-02 | Disposition: A | Discharge status: Home / Self Care | Payer: Medicare Other | Source: Ambulatory Visit | Attending: Orthopedic Surgery | Admitting: Orthopedic Surgery

## 2020-01-02 ENCOUNTER — Other Ambulatory Visit: Payer: Self-pay

## 2020-01-02 DIAGNOSIS — I10 Essential (primary) hypertension: Secondary | ICD-10-CM | POA: Diagnosis not present

## 2020-01-02 DIAGNOSIS — Z01818 Encounter for other preprocedural examination: Secondary | ICD-10-CM | POA: Insufficient documentation

## 2020-01-02 HISTORY — DX: Chronic kidney disease, unspecified: N18.9

## 2020-01-02 LAB — URINALYSIS, ROUTINE W REFLEX MICROSCOPIC
Bacteria, UA: NONE SEEN
Bilirubin Urine: NEGATIVE
Glucose, UA: NEGATIVE mg/dL
Hgb urine dipstick: NEGATIVE
Ketones, ur: NEGATIVE mg/dL
Leukocytes,Ua: NEGATIVE
Nitrite: NEGATIVE
Protein, ur: 100 mg/dL — AB
Specific Gravity, Urine: 1.023 (ref 1.005–1.030)
pH: 5 (ref 5.0–8.0)

## 2020-01-02 LAB — CBC
HCT: 38.8 % (ref 36.0–46.0)
Hemoglobin: 12.8 g/dL (ref 12.0–15.0)
MCH: 31.7 pg (ref 26.0–34.0)
MCHC: 33 g/dL (ref 30.0–36.0)
MCV: 96 fL (ref 80.0–100.0)
Platelets: 229 10*3/uL (ref 150–400)
RBC: 4.04 MIL/uL (ref 3.87–5.11)
RDW: 13 % (ref 11.5–15.5)
WBC: 5.1 10*3/uL (ref 4.0–10.5)
nRBC: 0 % (ref 0.0–0.2)

## 2020-01-02 LAB — BASIC METABOLIC PANEL
Anion gap: 10 (ref 5–15)
BUN: 17 mg/dL (ref 8–23)
CO2: 27 mmol/L (ref 22–32)
Calcium: 8.9 mg/dL (ref 8.9–10.3)
Chloride: 103 mmol/L (ref 98–111)
Creatinine, Ser: 0.5 mg/dL (ref 0.44–1.00)
GFR, Estimated: >60 mL/min (ref >60)
Glucose, Bld: 112 mg/dL — ABNORMAL HIGH (ref 70–99)
Potassium: 3.7 mmol/L (ref 3.5–5.1)
Sodium: 140 mmol/L (ref 135–145)

## 2020-01-02 LAB — SURGICAL PCR SCREEN
MRSA, PCR: NEGATIVE
Staphylococcus aureus: NEGATIVE

## 2020-01-02 LAB — APTT: aPTT: 24 seconds (ref 24–36)

## 2020-01-02 LAB — PROTIME-INR
INR: 0.9 (ref 0.8–1.2)
Prothrombin Time: 11.7 seconds (ref 11.4–15.2)

## 2020-01-02 NOTE — Patient Instructions (Signed)
Your procedure is scheduled on: January 09, 2020 [facility] Report to the Registration Desk on the 1st floor of the [facility]. To find out your arrival time, please call [phone] between 1PM - 3PM on: January 08, 2020 Tuesday   REMEMBER: Instructions that are not followed completely may result in serious medical risk, up to and including death; or upon the discretion of your surgeon and anesthesiologist your surgery may need to be rescheduled.  Do not eat food after midnight the night before surgery.  No gum chewing, lozengers or hard candies.  You may however, drink CLEAR liquids up to 2 hours before you are scheduled to arrive for your surgery. Do not drink anything within 2 hours of your scheduled arrival time.  Clear liquids include: - water  - apple juice without pulp - gatorade (not RED, PURPLE, OR BLUE) - black coffee or tea (Do NOT add milk or creamers to the coffee or tea) Do NOT drink anything that is not on this list.  Type 1 and Type 2 diabetics should only drink water.  TAKE THESE MEDICATIONS THE MORNING OF SURGERY WITH A SIP OF WATER: LEVOTHYROXINE CLARITIN   One week prior to surgery: Stop Anti-inflammatories (NSAIDS) such as Advil, Aleve, Ibuprofen, Motrin, Naproxen, Naprosyn and Aspirin based products such as Excedrin, Goodys Powder, BC Powder.   YOU MAY USE TYLENOL IF NEEDED Stop ANY OVER THE COUNTER supplements until after surgery. (However, you may continue taking Vitamin D, Vitamin B, and multivitamin up until the day before surgery.)  No Alcohol for 24 hours before or after surgery.  No Smoking including e-cigarettes for 24 hours prior to surgery.  No chewable tobacco products for at least 6 hours prior to surgery.  No nicotine patches on the day of surgery.  Do not use any "recreational" drugs for at least a week prior to your surgery.  Please be advised that the combination of cocaine and anesthesia may have negative outcomes, up to and including  death. If you test positive for cocaine, your surgery will be cancelled.  On the morning of surgery brush your teeth with toothpaste and water, you may rinse your mouth with mouthwash if you wish. Do not swallow any toothpaste or mouthwash.  Do not wear jewelry, make-up, hairpins, clips or nail polish.  Do not wear lotions, powders, or perfumes OR DEODORANT   Do not shave body from the neck down 48 hours prior to surgery just in case you cut yourself which could leave a site for infection.  Also, freshly shaved skin may become irritated if using the CHG soap.  Contact lenses, hearing aids and dentures may not be worn into surgery.  Do not bring valuables to the hospital. [facility] is not responsible for any missing/lost belongings or valuables.   Use CHG Soap  as directed on instruction sheet.  Notify your doctor if there is any change in your medical condition (cold, fever, infection).  Wear comfortable clothing (specific to your surgery type) to the hospital.  Plan for stool softeners for home use; pain medications have a tendency to cause constipation. You can also help prevent constipation by eating foods high in fiber such as fruits and vegetables and drinking plenty of fluids as your diet allows.  After surgery, you can help prevent lung complications by doing breathing exercises.  Take deep breaths and cough every 1-2 hours. Your doctor may order a device called an Incentive Spirometer to help you take deep breaths. When coughing or sneezing,  hold a pillow firmly against your incision with both hands. This is called "splinting." Doing this helps protect your incision. It also decreases belly discomfort.  If you are being admitted to the hospital overnight, YOU MAY BRING A SMALL BAG WITH YOU   If you are being discharged the day of surgery, you will not be allowed to drive home. You will need a responsible adult (18 years or older) to drive you home and stay with you that  night.   Please call the Pre-admissions Testing Dept. at [phone] if you have any questions about these instructions.  Visitation Policy:  Patients undergoing a surgery or procedure may have one family member or support person with them as long as that person is not COVID-19 positive or experiencing its symptoms.  That person may remain in the waiting area during the procedure.  Inpatient Visitation Update:   In an effort to ensure the safety of our team members and our patients, we are implementing a change to our visitation policy:  Effective Monday, Aug. 9, at 7 a.m., inpatients will be allowed one support person.  o The support person may change daily.  o The support person must pass our screening, gel in and out, and wear a mask at all times, including in the patient's room.  o Patients must also wear a mask when staff or their support person are in the room.  o Masking is required regardless of vaccination status.  Systemwide, no visitors 17 or younger.

## 2020-01-07 ENCOUNTER — Other Ambulatory Visit
Admission: RE | Admit: 2020-01-07 | Discharge: 2020-01-07 | Disposition: A | Discharge status: Home / Self Care | Payer: Medicare Other | Source: Ambulatory Visit | Attending: Orthopedic Surgery | Admitting: Orthopedic Surgery

## 2020-01-07 DIAGNOSIS — Z20822 Contact with and (suspected) exposure to covid-19: Secondary | ICD-10-CM | POA: Insufficient documentation

## 2020-01-07 DIAGNOSIS — Z01818 Encounter for other preprocedural examination: Secondary | ICD-10-CM | POA: Diagnosis present

## 2020-01-08 LAB — SARS CORONAVIRUS 2 (TAT 6-24 HRS): SARS Coronavirus 2: NEGATIVE

## 2020-01-09 ENCOUNTER — Observation Stay
Admission: RE | Admit: 2020-01-09 | Discharge: 2020-01-11 | Disposition: A | Discharge status: Home / Self Care | Payer: Medicare Other | Attending: Orthopedic Surgery | Admitting: Orthopedic Surgery

## 2020-01-09 ENCOUNTER — Ambulatory Visit: Payer: Medicare Other | Admitting: Urgent Care

## 2020-01-09 ENCOUNTER — Encounter: Payer: Self-pay | Admitting: Orthopedic Surgery

## 2020-01-09 ENCOUNTER — Other Ambulatory Visit: Payer: Self-pay

## 2020-01-09 ENCOUNTER — Encounter
Admission: RE | Disposition: A | Discharge status: Home / Self Care | Payer: Self-pay | Source: Home / Self Care | Attending: Orthopedic Surgery

## 2020-01-09 ENCOUNTER — Ambulatory Visit: Payer: Medicare Other

## 2020-01-09 DIAGNOSIS — Z96641 Presence of right artificial hip joint: Secondary | ICD-10-CM

## 2020-01-09 DIAGNOSIS — Z969 Presence of functional implant, unspecified: Secondary | ICD-10-CM | POA: Diagnosis not present

## 2020-01-09 DIAGNOSIS — M1611 Unilateral primary osteoarthritis, right hip: Principal | ICD-10-CM | POA: Insufficient documentation

## 2020-01-09 DIAGNOSIS — E039 Hypothyroidism, unspecified: Secondary | ICD-10-CM | POA: Diagnosis not present

## 2020-01-09 DIAGNOSIS — I129 Hypertensive chronic kidney disease with stage 1 through stage 4 chronic kidney disease, or unspecified chronic kidney disease: Secondary | ICD-10-CM | POA: Diagnosis not present

## 2020-01-09 DIAGNOSIS — Z85850 Personal history of malignant neoplasm of thyroid: Secondary | ICD-10-CM | POA: Insufficient documentation

## 2020-01-09 DIAGNOSIS — Z419 Encounter for procedure for purposes other than remedying health state, unspecified: Secondary | ICD-10-CM

## 2020-01-09 DIAGNOSIS — N189 Chronic kidney disease, unspecified: Secondary | ICD-10-CM | POA: Insufficient documentation

## 2020-01-09 HISTORY — PX: TOTAL HIP ARTHROPLASTY: SHX124

## 2020-01-09 SURGERY — ARTHROPLASTY, HIP, TOTAL, ANTERIOR APPROACH
Anesthesia: Spinal | Site: Hip | Laterality: Right

## 2020-01-09 MED ORDER — AMLODIPINE BESY-BENAZEPRIL HCL 10-40 MG PO CAPS: 1.0000 | ORAL_CAPSULE | Freq: Every day | ORAL | Status: DC

## 2020-01-09 MED ORDER — FAMOTIDINE 20 MG PO TABS
ORAL_TABLET | ORAL | Status: AC
  Administered 2020-01-09: 20 mg via ORAL
  Filled 2020-01-09: qty 1

## 2020-01-09 MED ORDER — FENTANYL CITRATE (PF) 100 MCG/2ML IJ SOLN
INTRAMUSCULAR | Status: AC
  Administered 2020-01-09: 25 ug via INTRAVENOUS
  Filled 2020-01-09: qty 2

## 2020-01-09 MED ORDER — OXYCODONE HCL 5 MG PO TABS: 5.0000 mg | ORAL_TABLET | Freq: Once | ORAL | Status: DC | PRN

## 2020-01-09 MED ORDER — ONDANSETRON HCL 4 MG PO TABS: 4.0000 mg | ORAL_TABLET | Freq: Four times a day (QID) | ORAL | Status: DC | PRN

## 2020-01-09 MED ORDER — ACETAMINOPHEN 10 MG/ML IV SOLN: 1000.0000 mg | Freq: Once | INTRAVENOUS | Status: DC | PRN

## 2020-01-09 MED ORDER — TRANEXAMIC ACID-NACL 1000-0.7 MG/100ML-% IV SOLN
INTRAVENOUS | Status: AC
  Filled 2020-01-09: qty 100

## 2020-01-09 MED ORDER — LEVOTHYROXINE SODIUM 50 MCG PO TABS
175.0000 ug | ORAL_TABLET | Freq: Every day | ORAL | Status: DC
  Administered 2020-01-10 – 2020-01-11 (×2): 175 ug via ORAL
  Filled 2020-01-09 (×2): qty 1

## 2020-01-09 MED ORDER — ACETAMINOPHEN 325 MG PO TABS: 325.0000 mg | ORAL_TABLET | Freq: Four times a day (QID) | ORAL | Status: DC | PRN

## 2020-01-09 MED ORDER — LIDOCAINE HCL (CARDIAC) PF 100 MG/5ML IV SOSY
PREFILLED_SYRINGE | INTRAVENOUS | Status: DC | PRN
  Administered 2020-01-09: 50 mg via INTRAVENOUS

## 2020-01-09 MED ORDER — ONDANSETRON HCL 4 MG/2ML IJ SOLN
4.0000 mg | Freq: Four times a day (QID) | INTRAMUSCULAR | Status: DC | PRN
  Administered 2020-01-09: 4 mg via INTRAVENOUS
  Filled 2020-01-09: qty 2

## 2020-01-09 MED ORDER — CHLORHEXIDINE GLUCONATE 0.12 % MT SOLN
OROMUCOSAL | Status: AC
  Administered 2020-01-09: 15 mL via OROMUCOSAL
  Filled 2020-01-09: qty 15

## 2020-01-09 MED ORDER — METOCLOPRAMIDE HCL 5 MG/ML IJ SOLN: 5.0000 mg | Freq: Three times a day (TID) | INTRAMUSCULAR | Status: DC | PRN

## 2020-01-09 MED ORDER — ORAL CARE MOUTH RINSE: 15.0000 mL | Freq: Once | OROMUCOSAL | Status: AC

## 2020-01-09 MED ORDER — PROPOFOL 10 MG/ML IV BOLUS
INTRAVENOUS | Status: AC
  Filled 2020-01-09: qty 20

## 2020-01-09 MED ORDER — AMLODIPINE BESYLATE 10 MG PO TABS
10.0000 mg | ORAL_TABLET | Freq: Every day | ORAL | Status: DC
  Administered 2020-01-09 – 2020-01-10 (×2): 10 mg via ORAL
  Filled 2020-01-09 (×2): qty 1

## 2020-01-09 MED ORDER — OXYCODONE HCL 5 MG/5ML PO SOLN: 5.0000 mg | Freq: Once | ORAL | Status: DC | PRN

## 2020-01-09 MED ORDER — MENTHOL 3 MG MT LOZG
1.0000 | LOZENGE | OROMUCOSAL | Status: DC | PRN
  Filled 2020-01-09: qty 9

## 2020-01-09 MED ORDER — HYDROCODONE-ACETAMINOPHEN 7.5-325 MG PO TABS: 1.0000 | ORAL_TABLET | ORAL | Status: DC | PRN

## 2020-01-09 MED ORDER — CEFAZOLIN SODIUM-DEXTROSE 2-4 GM/100ML-% IV SOLN
2.0000 g | Freq: Four times a day (QID) | INTRAVENOUS | Status: AC
  Administered 2020-01-09 (×2): 2 g via INTRAVENOUS
  Filled 2020-01-09 (×2): qty 100

## 2020-01-09 MED ORDER — PROPOFOL 10 MG/ML IV BOLUS
INTRAVENOUS | Status: DC | PRN
  Administered 2020-01-09: 50 mg via INTRAVENOUS
  Administered 2020-01-09: 20 mg via INTRAVENOUS
  Administered 2020-01-09: 30 mg via INTRAVENOUS

## 2020-01-09 MED ORDER — OXYCODONE HCL 5 MG PO TABS
5.0000 mg | ORAL_TABLET | ORAL | Status: DC | PRN
  Administered 2020-01-09 – 2020-01-10 (×3): 5 mg via ORAL
  Administered 2020-01-10 (×3): 10 mg via ORAL
  Administered 2020-01-10: 5 mg via ORAL
  Administered 2020-01-11 (×2): 10 mg via ORAL
  Filled 2020-01-09 (×2): qty 2
  Filled 2020-01-09: qty 1
  Filled 2020-01-09 (×2): qty 2
  Filled 2020-01-09 (×3): qty 1
  Filled 2020-01-09: qty 2

## 2020-01-09 MED ORDER — ACETAMINOPHEN 10 MG/ML IV SOLN
INTRAVENOUS | Status: AC
  Filled 2020-01-09: qty 100

## 2020-01-09 MED ORDER — BENAZEPRIL HCL 20 MG PO TABS
40.0000 mg | ORAL_TABLET | Freq: Every day | ORAL | Status: DC
  Administered 2020-01-09 – 2020-01-10 (×2): 40 mg via ORAL
  Filled 2020-01-09 (×3): qty 2

## 2020-01-09 MED ORDER — HYDROCODONE-ACETAMINOPHEN 5-325 MG PO TABS
1.0000 | ORAL_TABLET | ORAL | Status: DC | PRN
  Administered 2020-01-09 (×2): 1 via ORAL
  Filled 2020-01-09 (×2): qty 1

## 2020-01-09 MED ORDER — BUPIVACAINE-EPINEPHRINE (PF) 0.25% -1:200000 IJ SOLN
INTRAMUSCULAR | Status: DC | PRN
  Administered 2020-01-09: 20 mL via PERINEURAL

## 2020-01-09 MED ORDER — MIDAZOLAM HCL 2 MG/2ML IJ SOLN
INTRAMUSCULAR | Status: AC
  Filled 2020-01-09: qty 2

## 2020-01-09 MED ORDER — BUPIVACAINE HCL (PF) 0.5 % IJ SOLN
INTRAMUSCULAR | Status: AC
  Filled 2020-01-09: qty 10

## 2020-01-09 MED ORDER — PROPOFOL 500 MG/50ML IV EMUL
INTRAVENOUS | Status: DC | PRN
  Administered 2020-01-09: 100 ug/kg/min via INTRAVENOUS

## 2020-01-09 MED ORDER — CEFAZOLIN SODIUM-DEXTROSE 2-4 GM/100ML-% IV SOLN
2.0000 g | INTRAVENOUS | Status: AC
  Administered 2020-01-09: 2 g via INTRAVENOUS

## 2020-01-09 MED ORDER — MIDAZOLAM HCL 5 MG/5ML IJ SOLN
INTRAMUSCULAR | Status: DC | PRN
  Administered 2020-01-09 (×2): 1 mg via INTRAVENOUS

## 2020-01-09 MED ORDER — POVIDONE-IODINE 10 % EX SWAB: 2.0000 "application " | Freq: Once | CUTANEOUS | Status: DC

## 2020-01-09 MED ORDER — ACETAMINOPHEN 10 MG/ML IV SOLN
INTRAVENOUS | Status: DC | PRN
  Administered 2020-01-09: 1000 mg via INTRAVENOUS

## 2020-01-09 MED ORDER — LORATADINE 10 MG PO TABS
10.0000 mg | ORAL_TABLET | Freq: Every day | ORAL | Status: DC
  Administered 2020-01-10 – 2020-01-11 (×2): 10 mg via ORAL
  Filled 2020-01-09 (×2): qty 1

## 2020-01-09 MED ORDER — METOCLOPRAMIDE HCL 10 MG PO TABS
5.0000 mg | ORAL_TABLET | Freq: Three times a day (TID) | ORAL | Status: DC | PRN
Start: 2020-01-09 — End: 2020-01-11

## 2020-01-09 MED ORDER — ALUM & MAG HYDROXIDE-SIMETH 200-200-20 MG/5ML PO SUSP: 30.0000 mL | ORAL | Status: DC | PRN

## 2020-01-09 MED ORDER — BUPIVACAINE-EPINEPHRINE (PF) 0.25% -1:200000 IJ SOLN
INTRAMUSCULAR | Status: AC
  Filled 2020-01-09: qty 30

## 2020-01-09 MED ORDER — VENLAFAXINE HCL ER 37.5 MG PO CP24
37.5000 mg | ORAL_CAPSULE | Freq: Every day | ORAL | Status: DC
  Administered 2020-01-09 – 2020-01-11 (×3): 37.5 mg via ORAL
  Filled 2020-01-09 (×3): qty 1

## 2020-01-09 MED ORDER — CHLORHEXIDINE GLUCONATE 0.12 % MT SOLN: 15.0000 mL | Freq: Once | OROMUCOSAL | Status: AC

## 2020-01-09 MED ORDER — FENTANYL CITRATE (PF) 100 MCG/2ML IJ SOLN
25.0000 ug | INTRAMUSCULAR | Status: AC | PRN
  Administered 2020-01-09 (×4): 25 ug via INTRAVENOUS

## 2020-01-09 MED ORDER — LACTATED RINGERS IV SOLN: INTRAVENOUS | Status: DC

## 2020-01-09 MED ORDER — FAMOTIDINE 20 MG PO TABS: 20.0000 mg | ORAL_TABLET | Freq: Once | ORAL | Status: AC

## 2020-01-09 MED ORDER — PROPOFOL 500 MG/50ML IV EMUL
INTRAVENOUS | Status: AC
  Filled 2020-01-09: qty 50

## 2020-01-09 MED ORDER — MAGNESIUM HYDROXIDE 400 MG/5ML PO SUSP: 30.0000 mL | Freq: Every day | ORAL | Status: DC | PRN

## 2020-01-09 MED ORDER — ONDANSETRON HCL 4 MG/2ML IJ SOLN: 4.0000 mg | Freq: Once | INTRAMUSCULAR | Status: DC | PRN

## 2020-01-09 MED ORDER — MORPHINE SULFATE (PF) 2 MG/ML IV SOLN
0.5000 mg | INTRAVENOUS | Status: DC | PRN
  Administered 2020-01-09: 1 mg via INTRAVENOUS
  Filled 2020-01-09: qty 1

## 2020-01-09 MED ORDER — TRANEXAMIC ACID-NACL 1000-0.7 MG/100ML-% IV SOLN
1000.0000 mg | INTRAVENOUS | Status: AC
  Administered 2020-01-09: 1000 mg via INTRAVENOUS

## 2020-01-09 MED ORDER — SENNA 8.6 MG PO TABS
1.0000 | ORAL_TABLET | Freq: Two times a day (BID) | ORAL | Status: DC
  Administered 2020-01-09 – 2020-01-11 (×4): 8.6 mg via ORAL
  Filled 2020-01-09 (×4): qty 1

## 2020-01-09 MED ORDER — LACTATED RINGERS IV BOLUS
500.0000 mL | Freq: Once | INTRAVENOUS | Status: AC
  Administered 2020-01-09: 500 mL via INTRAVENOUS

## 2020-01-09 MED ORDER — BUPIVACAINE HCL (PF) 0.5 % IJ SOLN
INTRAMUSCULAR | Status: DC | PRN
  Administered 2020-01-09: 2.5 mL

## 2020-01-09 MED ORDER — PHENOL 1.4 % MT LIQD
1.0000 | OROMUCOSAL | Status: DC | PRN
  Filled 2020-01-09: qty 177

## 2020-01-09 MED ORDER — ATORVASTATIN CALCIUM 20 MG PO TABS
80.0000 mg | ORAL_TABLET | Freq: Every day | ORAL | Status: DC
  Administered 2020-01-10 – 2020-01-11 (×2): 80 mg via ORAL
  Filled 2020-01-09 (×2): qty 4

## 2020-01-09 MED ORDER — MAGNESIUM CITRATE PO SOLN
1.0000 | Freq: Once | ORAL | Status: DC | PRN
  Filled 2020-01-09: qty 296

## 2020-01-09 MED ORDER — VENLAFAXINE HCL ER 37.5 MG PO CP24: 37.5000 mg | ORAL_CAPSULE | Freq: Every day | ORAL | Status: DC

## 2020-01-09 MED ORDER — DOCUSATE SODIUM 100 MG PO CAPS
100.0000 mg | ORAL_CAPSULE | Freq: Two times a day (BID) | ORAL | Status: DC
  Administered 2020-01-09 – 2020-01-11 (×4): 100 mg via ORAL
  Filled 2020-01-09 (×4): qty 1

## 2020-01-09 MED ORDER — BISACODYL 10 MG RE SUPP
10.0000 mg | Freq: Every day | RECTAL | Status: DC | PRN
Start: 2020-01-09 — End: 2020-01-11

## 2020-01-09 MED ORDER — LIDOCAINE HCL (PF) 2 % IJ SOLN
INTRAMUSCULAR | Status: AC
  Filled 2020-01-09: qty 5

## 2020-01-09 MED ORDER — APIXABAN 2.5 MG PO TABS
2.5000 mg | ORAL_TABLET | Freq: Two times a day (BID) | ORAL | Status: DC
  Administered 2020-01-10 – 2020-01-11 (×3): 2.5 mg via ORAL
  Filled 2020-01-09 (×3): qty 1

## 2020-01-09 SURGICAL SUPPLY — 51 items
BLADE SAGITTAL WIDE XTHICK NO (BLADE) ×2 IMPLANT
BRUSH SCRUB EZ  4% CHG (MISCELLANEOUS) ×2
BRUSH SCRUB EZ 4% CHG (MISCELLANEOUS) ×2 IMPLANT
CHLORAPREP W/TINT 26 (MISCELLANEOUS) ×2 IMPLANT
COVER HOLE (Hips) ×1 IMPLANT
COVER WAND RF STERILE (DRAPES) ×2 IMPLANT
CUP R3 52MM (Hips) ×1 IMPLANT
DRAPE 3/4 80X56 (DRAPES) ×2 IMPLANT
DRAPE C-ARM 42X72 X-RAY (DRAPES) ×2 IMPLANT
DRAPE STERI IOBAN 125X83 (DRAPES) ×1 IMPLANT
DRSG AQUACEL AG ADV 3.5X10 (GAUZE/BANDAGES/DRESSINGS) ×1 IMPLANT
ELECT BLADE 6.5 EXT (BLADE) ×2 IMPLANT
ELECT REM PT RETURN 9FT ADLT (ELECTROSURGICAL) ×2
ELECTRODE REM PT RTRN 9FT ADLT (ELECTROSURGICAL) ×1 IMPLANT
GAUZE XEROFORM 1X8 LF (GAUZE/BANDAGES/DRESSINGS) IMPLANT
GLOVE INDICATOR 8.0 STRL GRN (GLOVE) ×2 IMPLANT
GLOVE SURG ORTHO 8.0 STRL STRW (GLOVE) ×4 IMPLANT
GOWN STRL REUS W/ TWL LRG LVL3 (GOWN DISPOSABLE) ×1 IMPLANT
GOWN STRL REUS W/ TWL XL LVL3 (GOWN DISPOSABLE) ×1 IMPLANT
GOWN STRL REUS W/TWL LRG LVL3 (GOWN DISPOSABLE) ×1
GOWN STRL REUS W/TWL XL LVL3 (GOWN DISPOSABLE) ×1
HEAD FEM KNEE TAPER 36MM XS-3 (Head) ×1 IMPLANT
HOOD PEEL AWAY FLYTE STAYCOOL (MISCELLANEOUS) ×6 IMPLANT
IRRIGATION SURGIPHOR STRL (IV SOLUTION) ×1 IMPLANT
IV NS 1000ML (IV SOLUTION) ×1
IV NS 1000ML BAXH (IV SOLUTION) ×1 IMPLANT
KIT PATIENT CARE HANA TABLE (KITS) ×2 IMPLANT
KIT TURNOVER CYSTO (KITS) ×2 IMPLANT
LINER ACETAB 0 DEG (Liner) ×1 IMPLANT
MANIFOLD NEPTUNE II (INSTRUMENTS) ×2 IMPLANT
MAT ABSORB  FLUID 56X50 GRAY (MISCELLANEOUS) ×1
MAT ABSORB FLUID 56X50 GRAY (MISCELLANEOUS) ×1 IMPLANT
NDL SAFETY ECLIPSE 18X1.5 (NEEDLE) ×2 IMPLANT
NDL SPNL 20GX3.5 QUINCKE YW (NEEDLE) ×1 IMPLANT
NEEDLE HYPO 18GX1.5 SHARP (NEEDLE) ×2
NEEDLE HYPO 22GX1.5 SAFETY (NEEDLE) ×2 IMPLANT
NEEDLE SPNL 20GX3.5 QUINCKE YW (NEEDLE) ×2 IMPLANT
PACK HIP PROSTHESIS (MISCELLANEOUS) ×2 IMPLANT
PADDING CAST BLEND 4X4 NS (MISCELLANEOUS) ×4 IMPLANT
PILLOW ABDUCTION MEDIUM (MISCELLANEOUS) ×2 IMPLANT
PULSAVAC PLUS IRRIG FAN TIP (DISPOSABLE) ×2
SCREW CAN HEAD 6.5MM HIP (Screw) ×1 IMPLANT
STAPLER SKIN PROX 35W (STAPLE) ×2 IMPLANT
STEM POLAR STD SZ4 COLLAR (Stent) ×1 IMPLANT
SUT BONE WAX W31G (SUTURE) ×2 IMPLANT
SUT DVC 2 QUILL PDO  T11 36X36 (SUTURE) ×1
SUT DVC 2 QUILL PDO T11 36X36 (SUTURE) ×1 IMPLANT
SUT VIC AB 2-0 CT1 18 (SUTURE) ×2 IMPLANT
SYR 20ML LL LF (SYRINGE) ×2 IMPLANT
TIP FAN IRRIG PULSAVAC PLUS (DISPOSABLE) ×1 IMPLANT
WAND WEREWOLF FASTSEAL 6.0 (MISCELLANEOUS) ×1 IMPLANT

## 2020-01-09 NOTE — Anesthesia Postprocedure Evaluation (Signed)
Anesthesia Post Note  Patient: SHAELIN LALLEY  Procedure(s) Performed: TOTAL HIP ARTHROPLASTY ANTERIOR APPROACH (Right Hip)  Patient location during evaluation: PACU Anesthesia Type: Combined General/Spinal Level of consciousness: oriented and awake and alert Pain management: pain level controlled Vital Signs Assessment: post-procedure vital signs reviewed and stable Respiratory status: spontaneous breathing, respiratory function stable and patient connected to nasal cannula oxygen Cardiovascular status: blood pressure returned to baseline and stable Postop Assessment: no headache, no backache, no apparent nausea or vomiting, spinal receding and patient able to bend at knees Anesthetic complications: no   No complications documented.   Last Vitals:  Vitals:   01/09/20 1134 01/09/20 1141  BP:  107/74  Pulse: 66 60  Resp: 19 11  Temp:    SpO2: 100% 100%    Last Pain:  Vitals:   01/09/20 1134  TempSrc:   PainSc: 7                  Corinda Gubler

## 2020-01-09 NOTE — Op Note (Signed)
01/09/2020  10:58 AM  PATIENT:  Pamela Dodson   MRN: [id number]  PRE-OPERATIVE DIAGNOSIS:  Osteoarthritis right hip   POST-OPERATIVE DIAGNOSIS: Same  Procedure: Right Total Hip Replacement  Surgeon: Dola Argyle. Odis Luster, MD   Assist: Altamese Cabal, PA-C  Anesthesia: Spinal   EBL: 150 mL   Specimens: None   Drains: None   Components used: A size 4 Polarstem [employer]hew, R3 size 52 mm shell, and a 36 mm by -3 mm head    Description of the procedure in detail: After informed consent was obtained and the appropriate extremity marked in the pre-operative holding area, the patient was taken to the operating room and placed in the supine position on the fracture table. All pressure points were well padded and bilateral lower extremities were place in traction spars. The hip was prepped and draped in standard sterile fashion. A spinal anesthetic had been delivered by the anesthesia team. The skin and subcutaneous tissues were injected with a mixture of Marcaine with epinephrine for post-operative pain. A longitudinal incision approximately 10 cm in length was carried out from the anterior superior iliac spine to the greater trochanter. The tensor fascia was divided and blunt dissection was taken down to the level of the joint capsule. The lateral circumflex vessels were cauterized. Deep retractors were placed and a portion of the anterior capsule was excised. Using fluoroscopy the neck cut was planned and carried out with a sagittal saw. The head was passed from the field with use of a corkscrew and hip skid. Deep retractors were placed along the acetabulum and the degenerative labrum and large osteophytes were removed with a Rongeur. The cup was sequentially reamed to a size 52 mm. The wound was irrigated and using fluoroscopy the size 52 mm cup was impacted in to anatomic position. A single screw was placed followed by a threaded hole cover. The final liner was impacted in to position.  Attention was then turned to the proximal femur. The leg was placed in extension and external rotation. The canal was opened and sequentially broached to a size 4. The trial components were placed and the hip relocated. The components were found to be in good position using fluoroscopy. The hip was dislocated and the trial components removed. The final components were impacted in to position and the hip relocated. The final components were again check with fluoroscopy and found to be in good position. Hemostasis was achieved with electrocautery. The deep capsule was injected with Marcaine and epinephrine. The wound was irrigated with bacitracin laced normal saline and the tensor fascia closed with #2 Quill suture. The subcutaneous tissues were closed with 2-0 vicryl and staples for the skin. A sterile dressing was applied and an abduction pillow. Patient tolerated the procedure well and there were no apparent complication. Patient was taken to the recovery room in good condition.   Cassell Smiles, MD

## 2020-01-09 NOTE — Anesthesia Preprocedure Evaluation (Signed)
Anesthesia Evaluation  Patient identified by MRN, date of birth, ID band Patient awake    Reviewed: Allergy & Precautions, NPO status , Patient's Chart, lab work & pertinent test results  History of Anesthesia Complications (+) PONV and history of anesthetic complications  Airway Mallampati: III  TM Distance: >3 FB Neck ROM: Limited    Dental no notable dental hx. (+) Teeth Intact   Pulmonary neg pulmonary ROS, neg sleep apnea, neg COPD, Patient abstained from smoking.Not current smoker,    Pulmonary exam normal breath sounds clear to auscultation       Cardiovascular Exercise Tolerance: Good METShypertension, + CAD  (-) Past MI (-) dysrhythmias  Rhythm:Regular Rate:Normal - Systolic murmurs    Neuro/Psych negative neurological ROS  negative psych ROS   GI/Hepatic neg GERD  ,(+)     (-) substance abuse  ,   Endo/Other  neg diabetesHypothyroidism   Renal/GU CRFRenal disease     Musculoskeletal  (+) Arthritis ,   Abdominal   Peds  Hematology   Anesthesia Other Findings Past Medical History: No date: Arthritis No date: Blood transfusion 2021: Bowel obstruction (HCC) No date: Cancer (HCC)     Comment:  hx thyroid cancer No date: Chronic kidney disease     Comment:  PROTEIN LEAK  11/02/2016: Full code status No date: GERD (gastroesophageal reflux disease) 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 Latrelle Dodrill, II in [facility] 08/07/2014: H/O malignant neoplasm of thyroid     Comment:  S/P resection x2  08/07/2014: History of knee replacement No date: Hypertension No date: Hypothyroidism No date: Neuromuscular disorder (HCC)     Comment:  numbnes lower legs s/p knee replacements No date: PONV (postoperative nausea and vomiting)     Comment:  usually needs zofran/antiemetic prior to surgery 01/22/2015: Shingles rash No date: Thyroid cancer (HCC) No date: Wears contact lenses  Reproductive/Obstetrics                              Anesthesia Physical Anesthesia Plan  ASA: II  Anesthesia Plan: General/Spinal   Post-op Pain Management:    Induction: Intravenous  PONV Risk Score and Plan: 4 or greater and Ondansetron, Dexamethasone, Propofol infusion, TIVA and Midazolam  Airway Management Planned: Natural Airway  Additional Equipment: None  Intra-op Plan:   Post-operative Plan:   Informed Consent: I have reviewed the patients History and Physical, chart, labs and discussed the procedure including the risks, benefits and alternatives for the proposed anesthesia with the patient or authorized representative who has indicated his/her understanding and acceptance.       Plan Discussed with: CRNA and Surgeon  Anesthesia Plan Comments: (Discussed R/B/A of neuraxial anesthesia technique with patient: - rare risks of spinal/epidural hematoma, nerve damage, infection - Risk of PDPH - Risk of nausea and vomiting - Risk of conversion to general anesthesia and its associated risks, including sore throat, damage to lips/teeth/oropharynx, and rare risks such as cardiac and respiratory events.  Patient voiced understanding.)        Anesthesia Quick Evaluation

## 2020-01-09 NOTE — Transfer of Care (Signed)
Immediate Anesthesia Transfer of Care Note  Patient: Pamela Dodson  Procedure(s) Performed: TOTAL HIP ARTHROPLASTY ANTERIOR APPROACH (Right Hip)  Patient Location: PACU  Anesthesia Type:Spinal  Level of Consciousness: drowsy and patient cooperative  Airway & Oxygen Therapy: Patient Spontanous Breathing  Post-op Assessment: Report given to RN and Post -op Vital signs reviewed and stable  Post vital signs: Reviewed and stable  Last Vitals:  Vitals Value Taken Time  BP 105/66 01/09/20 1053  Temp 36.1 C 01/09/20 1053  Pulse 60 01/09/20 1057  Resp 11 01/09/20 1054  SpO2 98 % 01/09/20 1057  Vitals shown include unvalidated device data.  Last Pain:  Vitals:   01/09/20 0722  TempSrc: Oral  PainSc: 0-No pain         Complications: No complications documented.

## 2020-01-09 NOTE — Progress Notes (Signed)
Pt pain 10/10. PRN PO given (see MAR), MD Odis Luster notified. Per MD Odis Luster ok to give IV Morphine now and change Norco to Oxycodone 5-10 mg Q3 PRN.

## 2020-01-09 NOTE — Anesthesia Procedure Notes (Signed)
Spinal  Patient location during procedure: OR Start time: 01/09/2020 8:40 AM End time: 01/09/2020 8:45 AM Staffing Performed: anesthesiologist  Anesthesiologist: Arita Miss, MD Resident/CRNA: Jonna Clark, CRNA Preanesthetic Checklist Completed: patient identified, IV checked, site marked, risks and benefits discussed, surgical consent, monitors and equipment checked, pre-op evaluation and timeout performed Spinal Block Patient position: sitting Prep: ChloraPrep Patient monitoring: heart rate, continuous pulse ox, blood pressure and cardiac monitor Approach: midline Location: L3-4 Injection technique: single-shot Needle Needle type: Quincke  Needle gauge: 22 G Needle length: 9 cm Assessment Sensory level: T10 Additional Notes Negative paresthesia. Negative blood return. Positive free-flowing CSF. Expiration date of kit checked and confirmed. Patient tolerated procedure well, without complications.

## 2020-01-09 NOTE — H&P (Signed)
The patient has been re-examined, and the chart reviewed, and there have been no interval changes to the documented history and physical.  Plan a right total hip today.  Anesthesia is not consulted regarding a peripheral nerve block for post-operative pain.  The risks, benefits, and alternatives have been discussed at length, and the patient is willing to proceed.    

## 2020-01-09 NOTE — Evaluation (Signed)
Physical Therapy Evaluation Patient Details Name: Pamela Dodson MRN: [id number] DOB: Jun 01, 1950 Today's Date: 01/09/2020   History of Present Illness  Pt is a 70 yo female diagnosed with OA of the R hip and is s/p elective R THA.  PMH includes bilateral TKA, L THA, thyroid CA, HTN, BLE numbness, and CAD.    Clinical Impression  Pt was pleasant and motivated to participate during the session and overall performed very well especially considering POD#0 status.  Of note, pt found on 2LO2/min but declined to use O2 during the session and removed the cannula.  Pt's SpO2 was monitored immediately and found to drop to a low of 86-87% with pt educated and cannula donned for the remainder of the session.  Pt's SpO2 on 2LO2/min remained in the mid to upper 90s for the remainder of the session, nursing notified.  Pt did not require physical assistance with any functional task and was able to ambulate around the EOB 2 x 5' with mild instability initially that improved as the session progressed.  Pt did have one episode of dry heaving during the session but it did not limit her participation and resolved quickly.  Pt will benefit from HHPT services upon discharge to safely address deficits listed in patient problem list for decreased caregiver assistance and eventual return to PLOF.       Follow Up Recommendations Home health PT;Supervision for mobility/OOB    Equipment Recommendations  None recommended by PT    Recommendations for Other Services       Precautions / Restrictions Precautions Precautions: Anterior Hip Precaution Booklet Issued: Yes (comment) Restrictions Weight Bearing Restrictions: Yes RLE Weight Bearing: Weight bearing as tolerated      Mobility  Bed Mobility Overal bed mobility: Modified Independent             General bed mobility comments: Extra time and effort only    Transfers Overall transfer level: Needs assistance Equipment used: Rolling walker (2  wheeled) Transfers: Sit to/from Stand Sit to Stand: Min guard         General transfer comment: Min to mod verbal cues for sequencing  Ambulation/Gait Ambulation/Gait assistance: Min guard Gait Distance (Feet): 5 Feet x 2 Assistive device: Rolling walker (2 wheeled) Gait Pattern/deviations: Step-to pattern;Antalgic;Decreased stance time - right Gait velocity: decreased   General Gait Details: Mild instability upon initial stand that improved during the session but with pt able to self-correct without assist  Stairs            Wheelchair Mobility    Modified Rankin (Stroke Patients Only)       Balance Overall balance assessment: Needs assistance   Sitting balance-Leahy Scale: Normal     Standing balance support: Bilateral upper extremity supported;During functional activity Standing balance-Leahy Scale: Fair Standing balance comment: Min to mod lean on the RW for support with standing balance grossly improving as the session progressed                             Pertinent Vitals/Pain Pain Assessment: 0-10 Pain Score: 6  Pain Location: R groin Pain Descriptors / Indicators: Sore;Aching Pain Intervention(s): Premedicated before session;Monitored during session;Ice applied    Home Living Family/patient expects to be discharged to:: Private residence Living Arrangements: Spouse/significant other Available Help at Discharge: Family;Available 24 hours/day Type of Home: House Home Access: Stairs to enter Entrance Stairs-Rails: None Entrance Stairs-Number of Steps: 3 Home Layout: Two level;Able to live  on main level with bedroom/bathroom Home Equipment: Walker - 2 wheels;Bedside commode Additional Comments: Tub shower on first floor, walk-in on the 2nd floor    Prior Function Level of Independence: Independent         Comments: Ind amb community distances without an AD, no fall history, Ind with ADLs, FT faculty member at [employer]        Extremity/Trunk Assessment   Upper Extremity Assessment Upper Extremity Assessment: Overall WFL for tasks assessed    Lower Extremity Assessment Lower Extremity Assessment: Generalized weakness;RLE deficits/detail RLE Deficits / Details: BLE ankle AROM and strength WNL, BLE sensation to light touch grossly intact, R hip flex strength >/= 3/5 RLE: Unable to fully assess due to pain RLE Sensation: WNL       Communication   Communication: No difficulties  Cognition Arousal/Alertness: Awake/alert Behavior During Therapy: WFL for tasks assessed/performed Overall Cognitive Status: Within Functional Limits for tasks assessed                                        General Comments      Exercises Total Joint Exercises Ankle Circles/Pumps: AROM;Both;5 reps;10 reps Quad Sets: Strengthening;Both;5 reps;10 reps Gluteal Sets: Strengthening;Both;10 reps Hip ABduction/ADduction: AROM;Strengthening;Both;5 reps Long Arc Quad: AROM;Strengthening;Both;15 reps;10 reps Knee Flexion: AROM;Strengthening;Both;10 reps;15 reps Marching in Standing: AROM;Strengthening;Both;10 reps;Standing Other Exercises Other Exercises: Sit to/from stand transfer training from multiple height surfaces with cues for sequencing Other Exercises: Lateral scooting at the EOB x 5 Other Exercises: Anterior hip precaution education per handout   Assessment/Plan    PT Assessment Patient needs continued PT services  PT Problem List Decreased strength;Decreased activity tolerance;Decreased balance;Decreased mobility;Decreased knowledge of use of DME;Pain       PT Treatment Interventions DME instruction;Gait training;Stair training;Functional mobility training;Therapeutic activities;Therapeutic exercise;Balance training;Patient/family education    PT Goals (Current goals can be found in the Care Plan section)  Acute Rehab PT Goals Patient Stated Goal: Improved balance  and ability to navigate stairs PT Goal Formulation: With patient Time For Goal Achievement: 01/22/20 Potential to Achieve Goals: Good    Frequency BID   Barriers to discharge        Co-evaluation               AM-PAC PT "6 Clicks" Mobility  Outcome Measure Help needed turning from your back to your side while in a flat bed without using bedrails?: A Little Help needed moving from lying on your back to sitting on the side of a flat bed without using bedrails?: A Little Help needed moving to and from a bed to a chair (including a wheelchair)?: A Little Help needed standing up from a chair using your arms (e.g., wheelchair or bedside chair)?: A Little Help needed to walk in hospital room?: A Little Help needed climbing 3-5 steps with a railing? : A Lot 6 Click Score: 17    End of Session Equipment Utilized During Treatment: Gait belt;Oxygen Activity Tolerance: Patient tolerated treatment well Patient left: in chair;with SCD's reapplied;with call bell/phone within reach;with chair alarm set;with family/visitor present;with nursing/sitter in room Nurse Communication: Mobility status;Weight bearing status PT Visit Diagnosis: Other abnormalities of gait and mobility (R26.89);Muscle weakness (generalized) (M62.81);Pain Pain - Right/Left: Right Pain - part of body: Hip    Time: 4166-0630 PT Time Calculation (min) (ACUTE ONLY): 64 min   Charges:  PT Evaluation $PT Eval Moderate Complexity: 1 Mod PT Treatments $Gait Training: 8-22 mins $Therapeutic Exercise: 8-22 mins        D. Scott Susano Cleckler PT, DPT 01/09/20, 3:04 PM

## 2020-01-10 ENCOUNTER — Encounter: Payer: Self-pay | Admitting: Orthopedic Surgery

## 2020-01-10 DIAGNOSIS — M1611 Unilateral primary osteoarthritis, right hip: Secondary | ICD-10-CM | POA: Diagnosis not present

## 2020-01-10 LAB — CBC
HCT: 34.5 % — ABNORMAL LOW (ref 36.0–46.0)
Hemoglobin: 11.3 g/dL — ABNORMAL LOW (ref 12.0–15.0)
MCH: 31.6 pg (ref 26.0–34.0)
MCHC: 32.8 g/dL (ref 30.0–36.0)
MCV: 96.4 fL (ref 80.0–100.0)
Platelets: 200 10*3/uL (ref 150–400)
RBC: 3.58 MIL/uL — ABNORMAL LOW (ref 3.87–5.11)
RDW: 13.2 % (ref 11.5–15.5)
WBC: 8.7 10*3/uL (ref 4.0–10.5)
nRBC: 0 % (ref 0.0–0.2)

## 2020-01-10 LAB — BASIC METABOLIC PANEL
Anion gap: 8 (ref 5–15)
BUN: 15 mg/dL (ref 8–23)
CO2: 28 mmol/L (ref 22–32)
Calcium: 8 mg/dL — ABNORMAL LOW (ref 8.9–10.3)
Chloride: 100 mmol/L (ref 98–111)
Creatinine, Ser: 0.59 mg/dL (ref 0.44–1.00)
GFR, Estimated: >60 mL/min (ref >60)
Glucose, Bld: 126 mg/dL — ABNORMAL HIGH (ref 70–99)
Potassium: 3.6 mmol/L (ref 3.5–5.1)
Sodium: 136 mmol/L (ref 135–145)

## 2020-01-10 LAB — ABO/RH: ABO/RH(D): O POS

## 2020-01-10 LAB — TYPE AND SCREEN
ABO/RH(D): O POS
Antibody Screen: NEGATIVE

## 2020-01-10 MED ORDER — DOCUSATE SODIUM 100 MG PO CAPS: 100.0000 mg | ORAL_CAPSULE | Freq: Two times a day (BID) | ORAL | 0 refills | Status: DC

## 2020-01-10 MED ORDER — OXYCODONE HCL 5 MG PO TABS: 5.0000 mg | ORAL_TABLET | ORAL | 0 refills | Status: AC | PRN

## 2020-01-10 MED ORDER — ONDANSETRON HCL 4 MG PO TABS: 4.0000 mg | ORAL_TABLET | Freq: Four times a day (QID) | ORAL | 0 refills | Status: DC | PRN

## 2020-01-10 MED ORDER — APIXABAN 2.5 MG PO TABS: 2.5000 mg | ORAL_TABLET | Freq: Two times a day (BID) | ORAL | 0 refills | Status: DC

## 2020-01-10 MED ORDER — METHOCARBAMOL 500 MG PO TABS: 500.0000 mg | ORAL_TABLET | Freq: Four times a day (QID) | ORAL | 1 refills | Status: DC | PRN

## 2020-01-10 NOTE — Progress Notes (Signed)
Physical Therapy Treatment Patient Details Name: Pamela Dodson MRN: [id number] DOB: 1950-04-14 Today's Date: 01/10/2020    History of Present Illness Pt is a 70 yo female diagnosed with OA of the R hip and is s/p elective R THA.  PMH includes bilateral TKA, L THA, thyroid CA, HTN, BLE numbness, and CAD.    PT Comments    Pt was pleasant and motivated to participate during the session but ultimately was limited by pain throughout.  Pt required physical assistance with bed mobility tasks that she did not require during the previous session.  Pt was able to ambulate 2 x 50 feet but only with very slow, effortful, step-to gait pattern with frequent short standing rest breaks and somewhat tearful throughout.  Pt and spouse participated in stair training with pt able to ascend backwards and descend forwards 4 steps with a RW again with significant time and effort required.  Pt will benefit from HHPT services upon discharge to safely address deficits listed in patient problem list for decreased caregiver assistance and eventual return to PLOF.     Follow Up Recommendations  Home health PT;Supervision for mobility/OOB     Equipment Recommendations  None recommended by PT    Recommendations for Other Services       Precautions / Restrictions Precautions Precautions: Anterior Hip Precaution Booklet Issued: Yes (comment) Restrictions Weight Bearing Restrictions: Yes RLE Weight Bearing: Weight bearing as tolerated    Mobility  Bed Mobility Overal bed mobility: Needs Assistance Bed Mobility: Supine to Sit     Supine to sit: Min assist     General bed mobility comments: Min A for RLE control  Transfers Overall transfer level: Needs assistance Equipment used: Rolling walker (2 wheeled) Transfers: Sit to/from Stand Sit to Stand: Min guard         General transfer comment: Min to mod verbal cues for sequencing  Ambulation/Gait Ambulation/Gait assistance: Min guard Gait  Distance (Feet): 50 Feet x 2 Assistive device: Rolling walker (2 wheeled) Gait Pattern/deviations: Step-to pattern;Antalgic;Decreased stance time - right;Trunk flexed;Decreased step length - right;Decreased step length - left Gait velocity: decreased   General Gait Details: Very slow cadence with short step length and frequent short standing rest breaks, took 10+ minutes to ambulate 50 feet, steady without LOB   Stairs Stairs: Yes Stairs assistance: Min guard;+2 safety/equipment Stair Management: Backwards;Forwards;Step to pattern;With walker Number of Stairs: 4 General stair comments: Stair training with pt and spouse with demonstration followed by pt practice; min to mod verbal cues for sequencing   Wheelchair Mobility    Modified Rankin (Stroke Patients Only)       Balance Overall balance assessment: Needs assistance   Sitting balance-Leahy Scale: Normal     Standing balance support: Bilateral upper extremity supported;During functional activity Standing balance-Leahy Scale: Good                              Cognition Arousal/Alertness: Awake/alert Behavior During Therapy: WFL for tasks assessed/performed Overall Cognitive Status: Within Functional Limits for tasks assessed                                        Exercises Other Exercises Other Exercises: Sit to/from stand transfer training from multiple height surfaces with cues for sequencing    General Comments        Pertinent Vitals/Pain  Pain Assessment: 0-10 Pain Score: 9  Pain Location: R groin Pain Descriptors / Indicators: Sore;Aching Pain Intervention(s): Premedicated before session;Monitored during session;Repositioned    Home Living                      Prior Function            PT Goals (current goals can now be found in the care plan section) Progress towards PT goals: Progressing toward goals    Frequency    BID      PT Plan Current plan  remains appropriate    Co-evaluation              AM-PAC PT "6 Clicks" Mobility   Outcome Measure  Help needed turning from your back to your side while in a flat bed without using bedrails?: A Little Help needed moving from lying on your back to sitting on the side of a flat bed without using bedrails?: A Little Help needed moving to and from a bed to a chair (including a wheelchair)?: A Little Help needed standing up from a chair using your arms (e.g., wheelchair or bedside chair)?: A Little Help needed to walk in hospital room?: A Little Help needed climbing 3-5 steps with a railing? : A Little 6 Click Score: 18    End of Session Equipment Utilized During Treatment: Gait belt;Oxygen Activity Tolerance: Patient tolerated treatment well Patient left: in chair;with SCD's reapplied;with call bell/phone within reach;with chair alarm set;with family/visitor present Nurse Communication: Mobility status;Weight bearing status;Other (comment) (SpO2 88-89% after amb, placed back on 2LO2/min per nsg request) PT Visit Diagnosis: Other abnormalities of gait and mobility (R26.89);Muscle weakness (generalized) (M62.81);Pain Pain - Right/Left: Right Pain - part of body: Hip     Time: 3220-2542 PT Time Calculation (min) (ACUTE ONLY): 44 min  Charges:  $Gait Training: 23-37 mins $Therapeutic Activity: 8-22 mins                     D. Scott Terrika Zuver PT, DPT 01/10/20, 12:08 PM

## 2020-01-10 NOTE — Discharge Instructions (Signed)

## 2020-01-10 NOTE — Progress Notes (Signed)
  Subjective:  Patient reports pain as moderate.  Muscle spasms.  Objective:   VITALS:   Vitals:   01/09/20 2118 01/10/20 0010 01/10/20 0019 01/10/20 0332  BP: 122/62 135/70  122/66  Pulse: 78 94  91  Resp: $Remo'16 16  18  'gmKFG$ Temp: 99.4 F (37.4 C) 100.3 F (37.9 C) 99.7 F (37.6 C) 99.4 F (37.4 C)  TempSrc:   Oral Oral  SpO2: 96% 92%  93%  Weight:      Height:        PHYSICAL EXAM:  Neurologically intact ABD soft Neurovascular intact Sensation intact distally Intact pulses distally Dorsiflexion/Plantar flexion intact Incision: dressing C/D/I No cellulitis present Compartment soft  LABS  Results for orders placed or performed during the hospital encounter of 01/09/20 (from the past 24 hour(s))  ABO/Rh     Status: None   Collection Time: 01/09/20  8:09 AM  Result Value Ref Range   ABO/RH(D)      O POS Performed at [facility] [place], [address] [place], [place] [address]   CBC     Status: Abnormal   Collection Time: 01/10/20  3:25 AM  Result Value Ref Range   WBC 8.7 4.0 - 10.5 K/uL   RBC 3.58 (L) 3.87 - 5.11 MIL/uL   Hemoglobin 11.3 (L) 12.0 - 15.0 g/dL   HCT 34.5 (L) 36.0 - 46.0 %   MCV 96.4 80.0 - 100.0 fL   MCH 31.6 26.0 - 34.0 pg   MCHC 32.8 30.0 - 36.0 g/dL   RDW 13.2 11.5 - 15.5 %   Platelets 200 150 - 400 K/uL   nRBC 0.0 0.0 - 0.2 %  Basic metabolic panel     Status: Abnormal   Collection Time: 01/10/20  3:25 AM  Result Value Ref Range   Sodium 136 135 - 145 mmol/L   Potassium 3.6 3.5 - 5.1 mmol/L   Chloride 100 98 - 111 mmol/L   CO2 28 22 - 32 mmol/L   Glucose, Bld 126 (H) 70 - 99 mg/dL   BUN 15 8 - 23 mg/dL   Creatinine, Ser 0.59 0.44 - 1.00 mg/dL   Calcium 8.0 (L) 8.9 - 10.3 mg/dL   GFR, Estimated >60 >60 mL/min   Anion gap 8 5 - 15    DG HIP OPERATIVE UNILAT W OR W/O PELVIS RIGHT  Result Date: 01/09/2020 CLINICAL DATA:  Right hip replacement. EXAM: OPERATIVE right HIP (WITH PELVIS IF PERFORMED) 8 VIEWS TECHNIQUE: Fluoroscopic spot  image(s) were submitted for interpretation post-operatively. Radiation exposure index: 2.8688 mGy. COMPARISON:  December 16, 2010. FINDINGS: Eight intraoperative fluoroscopic images were obtained of the right hip. The right acetabular and femoral components appear to be well situated. IMPRESSION: Status post right total hip arthroplasty. Electronically Signed   By: Marijo Conception M.D.   On: 01/09/2020 10:43    Assessment/Plan: 1 Day Post-Op   Active Problems:   History of total hip replacement, right   Advance diet Up with therapy Discharge today if PT goals met  [name] , PA-C 01/10/2020, 6:37 AM

## 2020-01-10 NOTE — Discharge Summary (Signed)
Physician Discharge Summary  Patient ID: Pamela Dodson MRN: [id number] DOB/AGE: 07/30/1950 70 y.o.  Admit date: 01/09/2020 Discharge date: 01/10/2020  Admission Diagnoses:  M16.11 Unilateral primary osteoarthritis, right hip <principal problem not specified>  Discharge Diagnoses:  M16.11 Unilateral primary osteoarthritis, right hip Active Problems:   History of total hip replacement, right   Past Medical History:  Diagnosis Date  . Arthritis   . Blood transfusion   . Bowel obstruction (HCC) 2021  . Cancer (HCC)    hx thyroid cancer  . Chronic kidney disease    PROTEIN LEAK   . Full code status 11/02/2016  . GERD (gastroesophageal reflux disease)   . 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 Latrelle Dodrill, II in [place]  . H/O malignant neoplasm of thyroid 08/07/2014   S/P resection x2   . History of knee replacement 08/07/2014  . Hypertension   . Hypothyroidism   . Neuromuscular disorder (HCC)    numbnes lower legs s/p knee replacements  . PONV (postoperative nausea and vomiting)    usually needs zofran/antiemetic prior to surgery  . Shingles rash 01/22/2015  . Thyroid cancer (HCC)   . Wears contact lenses     Surgeries: Procedure(s): TOTAL HIP ARTHROPLASTY ANTERIOR APPROACH on 01/09/2020   Consultants (if any):   Discharged Condition: Improved  Hospital Course: Pamela Dodson is an 70 y.o. female who was admitted 01/09/2020 with a diagnosis of  M16.11 Unilateral primary osteoarthritis, right hip <principal problem not specified> and went to the operating room on 01/09/2020 and underwent the above named procedures.    She was given perioperative antibiotics:  Anti-infectives (From admission, onward)   Start     Dose/Rate Route Frequency Ordered Stop   01/09/20 1330  ceFAZolin (ANCEF) IVPB 2g/100 mL premix        2 g 200 mL/hr over 30 Minutes Intravenous Every 6 hours 01/09/20 1231 01/09/20 2015   01/09/20 0715  ceFAZolin (ANCEF) IVPB 2g/100 mL premix        2 g 200 mL/hr over  30 Minutes Intravenous On call to O.R. 01/09/20 9326 01/09/20 0916    .  She was given sequential compression devices, early ambulation, and Eliquis for DVT prophylaxis.  She benefited maximally from the hospital stay and there were no complications.    Recent vital signs:  Vitals:   01/10/20 0019 01/10/20 0332  BP:  122/66  Pulse:  91  Resp:  18  Temp: 99.7 F (37.6 C) 99.4 F (37.4 C)  SpO2:  93%    Recent laboratory studies:  Lab Results  Component Value Date   HGB 11.3 (L) 01/10/2020   HGB 12.8 01/02/2020   HGB 13.4 12/11/2019   Lab Results  Component Value Date   WBC 8.7 01/10/2020   PLT 200 01/10/2020   Lab Results  Component Value Date   INR 0.9 01/02/2020   Lab Results  Component Value Date   NA 136 01/10/2020   K 3.6 01/10/2020   CL 100 01/10/2020   CO2 28 01/10/2020   BUN 15 01/10/2020   CREATININE 0.59 01/10/2020   GLUCOSE 126 (H) 01/10/2020    Discharge Medications:   Allergies as of 01/10/2020      Reactions   Toradol [ketorolac Tromethamine] Other (See Comments)   Intolerance to NSAIDs due to CKD      Medication List    TAKE these medications   amLODipine-benazepril 10-40 MG capsule Commonly known as: LOTREL Take 1 capsule by mouth at bedtime.  apixaban 2.5 MG Tabs tablet Commonly known as: ELIQUIS Take 1 tablet (2.5 mg total) by mouth every 12 (twelve) hours.   atorvastatin 80 MG tablet Commonly known as: LIPITOR Take 1 tablet (80 mg total) by mouth daily.   Black Cohosh 160 MG Caps Take by mouth.   Cholecalciferol 25 MCG (1000 UT) capsule Take 1,000 Units by mouth daily.   docusate sodium 100 MG capsule Commonly known as: COLACE Take 1 capsule (100 mg total) by mouth 2 (two) times daily.   Fish Oil 1000 MG Cpdr Take 1,000 mg by mouth 2 (two) times daily.   levothyroxine 175 MCG tablet Commonly known as: SYNTHROID Take 1 tablet (175 mcg total) by mouth daily before breakfast.   loratadine 10 MG tablet Commonly known  as: CLARITIN Take 10 mg by mouth daily.   methocarbamol 500 MG tablet Commonly known as: Robaxin Take 1 tablet (500 mg total) by mouth every 6 (six) hours as needed for muscle spasms.   multivitamin tablet Take 1 tablet by mouth daily.   ondansetron 4 MG tablet Commonly known as: ZOFRAN Take 1 tablet (4 mg total) by mouth every 6 (six) hours as needed for nausea.   oxyCODONE 5 MG immediate release tablet Commonly known as: Oxy IR/ROXICODONE Take 1-2 tablets (5-10 mg total) by mouth every 4 (four) hours as needed for up to 5 days for moderate pain or severe pain.   venlafaxine XR 37.5 MG 24 hr capsule Commonly known as: EFFEXOR-XR Take 1 capsule (37.5 mg total) by mouth daily with breakfast.   vitamin C 100 MG tablet Take 100 mg by mouth daily.            Durable Medical Equipment  (From admission, onward)         Start     Ordered   01/10/20 0642  For home use only DME 3 n 1  Once        01/10/20 [id number]   01/10/20 0642  For home use only DME Walker rolling  Once       Question Answer Comment  Walker: With 5 Inch Wheels   Patient needs a walker to treat with the following condition Osteoarthritis of right hip      01/10/20 [id number]   01/09/20 1231  DME Walker rolling  Once       Question:  Patient needs a walker to treat with the following condition  Answer:  History of total hip replacement, right   01/09/20 1231   01/09/20 1231  DME 3 n 1  Once        01/09/20 1231   01/09/20 1231  DME Bedside commode  Once       Question:  Patient needs a bedside commode to treat with the following condition  Answer:  History of total hip replacement, right   01/09/20 1231          Diagnostic Studies: DG HIP OPERATIVE UNILAT W OR W/O PELVIS RIGHT  Result Date: 01/09/2020 CLINICAL DATA:  Right hip replacement. EXAM: OPERATIVE right HIP (WITH PELVIS IF PERFORMED) 8 VIEWS TECHNIQUE: Fluoroscopic spot image(s) were submitted for interpretation post-operatively. Radiation exposure  index: 2.8688 mGy. COMPARISON:  December 16, 2010. FINDINGS: Eight intraoperative fluoroscopic images were obtained of the right hip. The right acetabular and femoral components appear to be well situated. IMPRESSION: Status post right total hip arthroplasty. Electronically Signed   By: Marijo Conception M.D.   On: 01/09/2020 10:43    Disposition: Discharge disposition:  01-Home or Self Care      Follow up in 2 weeks for staple removal.      Signed: [name] ,PA-C 01/10/2020, 6:42 AM

## 2020-01-10 NOTE — TOC Initial Note (Signed)
Transition of Care [facility]) - Initial/Assessment Note    Patient Details  Name: Pamela Dodson MRN: [id number] Date of Birth: 1950/04/17  Transition of Care [facility]) CM/SW Contact:    Magnus Ivan, LCSW Phone Number: 01/10/2020, 1:46 PM  Clinical Narrative:                PT is recommending HHPT, RW, and 3 in 1. CSW spoke with patient's husband who reported patient lives with him and typically drives herself to appointments. PCP is Hotel manager. Pharmacy is [facility]. Patient has a RW and BSC already. Patient had [facility] about 10 years ago, could not recall agency used. No SNF history. Patient and spouse agreeable to HHPT being arranged, denied agency preference. Referral made and accepted by [facility] Representative Joelene Millin. Patient is on home o2 currently, CSW asked RN to let CSW know if patient will need o2 ordered for home use. Patient's husband reported they are fine with this being ordered if needed at time of DC.    Expected Discharge Plan: [place] Barriers to Discharge: Continued Medical Work up   Patient Goals and CMS Choice Patient states their goals for this hospitalization and ongoing recovery are:: home with home health CMS Medicare.gov Compare Post Acute Care list provided to:: Patient Choice offered to / list presented to : [facility]  Expected Discharge Plan and Services Expected Discharge Plan: [place]       Living arrangements for the past 2 months: Single Family Home Expected Discharge Date: 01/10/20                         HH Arranged: PT HH Agency: [facility] Date [place]: 01/10/20   Representative spoke with at [place]: Glennis Brink  Prior Living Arrangements/Services Living arrangements for the past 2 months: [place] Lives with:: Spouse Patient language and need for interpreter reviewed:: Yes Do you feel safe going back to the place where you live?: Yes      Need  for Family Participation in Patient Care: Yes (Comment) Care giver support system in place?: Yes (comment) Current home services: DME Criminal Activity/Legal Involvement Pertinent to Current Situation/Hospitalization: No - Comment as needed  Activities of Daily Living Home Assistive Devices/Equipment: Eyeglasses ADL Screening (condition at time of admission) Patient's cognitive ability adequate to safely complete daily activities?: Yes Is the patient deaf or have difficulty hearing?: No Does the patient have difficulty seeing, even when wearing glasses/contacts?: No Does the patient have difficulty concentrating, remembering, or making decisions?: No Patient able to express need for assistance with ADLs?: Yes Does the patient have difficulty dressing or bathing?: No Independently performs ADLs?: Yes (appropriate for developmental age) Does the patient have difficulty walking or climbing stairs?: No Weakness of Legs: None Weakness of Arms/Hands: None  Permission Sought/Granted Permission sought to share information with : Chartered certified accountant granted to share information with : Yes, Verbal Permission Granted     Permission granted to share info w AGENCY: [place] and DME agencies as needed        Emotional Assessment       Orientation: : Oriented to Self,Oriented to Place,Oriented to  Time,Oriented to Situation Alcohol / Substance Use: Not Applicable Psych Involvement: No (comment)  Admission diagnosis:  History of total hip replacement, right [id number] Patient Active Problem List   Diagnosis Date Noted  . History of total hip replacement, right 01/09/2020  .  SBO (small bowel obstruction) (HCC) 03/13/2019  . History of resection of large bowel 03/13/2019  . Small bowel obstruction (HCC) 03/13/2019  . Aortic atherosclerosis (HCC) 03/13/2019  . Coronary atherosclerosis 03/13/2019  . Benign essential hypertension 10/10/2018  . Chronic kidney disease, stage II  (mild) 10/10/2018  . 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 Obesity (BMI 30.0-34.9) 01/16/2018  . Proteinuria 07/15/2017  . Urine test positive for microalbuminuria 07/15/2017  . Prediabetes 07/12/2016  . Abnormal mammogram of left breast 07/21/2015  . Postsurgical intestinal bypass or anastomosis status   . Hypertension goal BP (blood pressure) < 150/90 08/07/2014  . Gastro-esophageal reflux disease without esophagitis 08/07/2014  . Hot flash, menopausal 08/07/2014  . Lumbar disc disease with radiculopathy 08/07/2014  . Dysmetabolic syndrome 08/07/2014  . Post-surgical hypothyroidism 08/07/2014  . Generalized OA 08/07/2014  . Hypercholesterolemia without hypertriglyceridemia 08/07/2014  . Hernia of anterior abdominal wall 08/07/2014  . Vestibular neuronitis 08/07/2014  . Arthritis of hip 12/16/2010   PCP:  Danelle Berry, PA-C Pharmacy:   [facility] [place],  - [address] [place], [address] [place], [address] [place]  [address] Phone: [phone] Fax: [phone]  [id number] [place], [place] - [place] [address] AT [facility] [address] [place] [address] [place] [address] Phone: [phone] Fax: [phone]     Social Determinants of Health (SDOH) Interventions    Readmission Risk Interventions No flowsheet data found.

## 2020-01-10 NOTE — Care Management Obs Status (Signed)
MEDICARE OBSERVATION STATUS NOTIFICATION   Patient Details  Name: Pamela Dodson MRN: [id number] Date of Birth: June 21, 1950   Medicare Observation Status Notification Given:  Yes    Ariez Neilan E Octavia Mottola, LCSW 01/10/2020, 1:45 PM

## 2020-01-10 NOTE — Progress Notes (Signed)
Physical Therapy Treatment Patient Details Name: Pamela Dodson MRN: [id number] DOB: 11-24-1950 Today's Date: 01/10/2020    History of Present Illness Pt is a 70 yo female diagnosed with OA of the R hip and is s/p elective R THA.  PMH includes bilateral TKA, L THA, thyroid CA, HTN, BLE numbness, and CAD.    PT Comments    Pt was pleasant and motivated to participate during the session.  Pt found on 2LO2/min with SpO2 in the low to mid 90s.  Per nursing request pt weaned to room air for session.  Pt required CGA with all functional tasks and although ambulation was very slow the pt was steady with no buckling or LOB.  Pt's SpO2 monitored frequently during the session and would drop into the mid 80s at time during effort but would quickly return to the low 90s with cues for breathing technique.  Pt reported feeling that she would breathe shallow and rapid during functional tasks secondary to R hip pain.  Pt was steady ascending and descending 4 stairs with spouse present for training again with SpO2 dropping to the mid 80s and returning to the low 90s with cues for breathing technique.  The lowest her SpO2 was observed to drop during the session was to 85%.  At the end of the session the pt returned to sitting in the recliner on room air with SpO2 dropping down to 88-89% with pt then returned to 2LO2/min, nursing notified.  Pt will benefit from HHPT services upon discharge to safely address deficits listed in patient problem list for decreased caregiver assistance and eventual return to PLOF.     Follow Up Recommendations  Home health PT;Supervision for mobility/OOB     Equipment Recommendations  None recommended by PT    Recommendations for Other Services       Precautions / Restrictions Precautions Precautions: Anterior Hip Precaution Booklet Issued: Yes (comment) Restrictions Weight Bearing Restrictions: Yes RLE Weight Bearing: Weight bearing as tolerated    Mobility  Bed  Mobility               General bed mobility comments: NT, pt in recliner  Transfers Overall transfer level: Needs assistance Equipment used: Rolling walker (2 wheeled) Transfers: Sit to/from Stand Sit to Stand: Min guard         General transfer comment: Min to mod verbal cues for sequencing  Ambulation/Gait Ambulation/Gait assistance: Min guard Gait Distance (Feet): 50 Feet Assistive device: Rolling walker (2 wheeled) Gait Pattern/deviations: Step-to pattern;Antalgic;Decreased stance time - right;Trunk flexed;Decreased step length - right;Decreased step length - left Gait velocity: decreased   General Gait Details: Very slow cadence with short step length and frequent short standing rest breaks, took around 8 minutes to ambulate 50 feet, steady without LOB   Stairs Stairs: Yes Stairs assistance: Min guard Stair Management: Backwards;Forwards;Step to pattern;With walker Number of Stairs: 4 General stair comments: Stair training with pt and spouse with teach back method followed by pt practice; min verbal cues for sequencing with good carryover overall   Wheelchair Mobility    Modified Rankin (Stroke Patients Only)       Balance Overall balance assessment: Needs assistance   Sitting balance-Leahy Scale: Normal     Standing balance support: Bilateral upper extremity supported;During functional activity Standing balance-Leahy Scale: Good                              Cognition Arousal/Alertness: Awake/alert  Behavior During Therapy: WFL for tasks assessed/performed Overall Cognitive Status: Within Functional Limits for tasks assessed                                        Exercises Total Joint Exercises Ankle Circles/Pumps: AROM;Both;10 reps Long Arc Quad: AROM;Strengthening;Both;15 reps;10 reps Knee Flexion: AROM;Strengthening;Both;10 reps;15 reps Other Exercises Other Exercises: Car transfer sequencing verbal education with  pt and spouse Other Exercises: Multiple sit to/from stand transfers from various height surfaces    General Comments        Pertinent Vitals/Pain Pain Assessment: 0-10 Pain Score: 5  Pain Location: R groin Pain Descriptors / Indicators: Sore;Aching Pain Intervention(s): Premedicated before session;Monitored during session    Home Living                      Prior Function            PT Goals (current goals can now be found in the care plan section) Progress towards PT goals: Progressing toward goals    Frequency    BID      PT Plan Current plan remains appropriate    Co-evaluation              AM-PAC PT "6 Clicks" Mobility   Outcome Measure  Help needed turning from your back to your side while in a flat bed without using bedrails?: A Little Help needed moving from lying on your back to sitting on the side of a flat bed without using bedrails?: A Little Help needed moving to and from a bed to a chair (including a wheelchair)?: A Little Help needed standing up from a chair using your arms (e.g., wheelchair or bedside chair)?: A Little Help needed to walk in hospital room?: A Little Help needed climbing 3-5 steps with a railing? : A Little 6 Click Score: 18    End of Session Equipment Utilized During Treatment: Gait belt;Oxygen Activity Tolerance: Patient tolerated treatment well Patient left: in chair;with SCD's reapplied;with call bell/phone within reach;with chair alarm set;with family/visitor present Nurse Communication: Mobility status;Weight bearing status;Other (comment) (SpO2 response to activity per above) PT Visit Diagnosis: Other abnormalities of gait and mobility (R26.89);Muscle weakness (generalized) (M62.81);Pain Pain - Right/Left: Right Pain - part of body: Hip     Time: 6659-9357 PT Time Calculation (min) (ACUTE ONLY): 55 min  Charges:  $Gait Training: 23-37 mins $Therapeutic Exercise: 8-22 mins $Therapeutic Activity: 8-22  mins                     D. Scott Lendon George PT, DPT 01/10/20, 4:44 PM

## 2020-01-11 DIAGNOSIS — M1611 Unilateral primary osteoarthritis, right hip: Secondary | ICD-10-CM | POA: Diagnosis not present

## 2020-01-11 LAB — CBC
HCT: 33.1 % — ABNORMAL LOW (ref 36.0–46.0)
Hemoglobin: 11 g/dL — ABNORMAL LOW (ref 12.0–15.0)
MCH: 32.3 pg (ref 26.0–34.0)
MCHC: 33.2 g/dL (ref 30.0–36.0)
MCV: 97.1 fL (ref 80.0–100.0)
Platelets: 195 10*3/uL (ref 150–400)
RBC: 3.41 MIL/uL — ABNORMAL LOW (ref 3.87–5.11)
RDW: 13.5 % (ref 11.5–15.5)
WBC: 8.8 10*3/uL (ref 4.0–10.5)
nRBC: 0 % (ref 0.0–0.2)

## 2020-01-11 LAB — SURGICAL PATHOLOGY

## 2020-01-11 NOTE — Progress Notes (Signed)
  Subjective:  Patient reports pain as mild to moderate.  Pain improved from yesterday.  Moving better.  Objective:   VITALS:   Vitals:   01/10/20 2259 01/11/20 0503 01/11/20 0505 01/11/20 0742  BP: 133/67  (!) 115/57 (!) 104/59  Pulse: 91 92 93 81  Resp:    17  Temp: 99.2 F (37.3 C) 99.1 F (37.3 C)  97.8 F (36.6 C)  TempSrc:      SpO2: 92% (!) 89% (!) 89% 91%  Weight:      Height:        PHYSICAL EXAM:  Neurologically intact ABD soft Neurovascular intact Sensation intact distally Intact pulses distally Dorsiflexion/Plantar flexion intact Incision: scant drainage and dressing changed No cellulitis present Compartment soft  LABS  No results found for this or any previous visit (from the past 24 hour(s)).  DG HIP OPERATIVE UNILAT W OR W/O PELVIS RIGHT  Result Date: 01/09/2020 CLINICAL DATA:  Right hip replacement. EXAM: OPERATIVE right HIP (WITH PELVIS IF PERFORMED) 8 VIEWS TECHNIQUE: Fluoroscopic spot image(s) were submitted for interpretation post-operatively. Radiation exposure index: 2.8688 mGy. COMPARISON:  December 16, 2010. FINDINGS: Eight intraoperative fluoroscopic images were obtained of the right hip. The right acetabular and femoral components appear to be well situated. IMPRESSION: Status post right total hip arthroplasty. Electronically Signed   By: Marijo Conception M.D.   On: 01/09/2020 10:43    Assessment/Plan: 2 Days Post-Op   Active Problems:   History of total hip replacement, right   Advance diet Up with therapy  Discharge today when PT goals met.   [name] , PA-C 01/11/2020, 8:04 AM

## 2020-01-11 NOTE — Progress Notes (Signed)
Physical Therapy Treatment Patient Details Name: Pamela Dodson MRN: [id number] DOB: 25-Mar-1950 Today's Date: 01/11/2020    History of Present Illness Pt is a 70 yo female diagnosed with OA of the R hip and is s/p elective R THA.  PMH includes bilateral TKA, L THA, thyroid CA, HTN, BLE numbness, and CAD.    PT Comments    Pt was sitting in recliner upon arriving. She is A and O x 4 and on rm air. Sao2 86% that dropped to 79 % without use of O2. Highly recommend DC home with O2. With 2 L O2 pt was able to maintain > 88%. Pt requested to attempt to have BM. Unsuccessful however was able to pass a lot of gas. Has not had a BM since admission. She stood and took steps to [facility] prior to stand and ambulating with supervision to doorway of room and return. Supportive spouse arrived. Both pt and pt's spouse feel confident in abilities to safely manage at home. Performed stair training previous date and requested not to perform today. Pt is cleared form PT standpoint for safe DC home with HHPT to follow to continue to progress pt to PLOF. Pt left on 2 L o2 with RN and SW aware. At conclusion of session, pt was in recliner with call bell in reach and chair alarm in place.    Follow Up Recommendations  Home health PT;Supervision for mobility/OOB     Equipment Recommendations  None recommended by PT    Recommendations for Other Services       Precautions / Restrictions Precautions Precautions: Anterior Hip Precaution Booklet Issued: Yes (comment) Restrictions Weight Bearing Restrictions: No RLE Weight Bearing: Weight bearing as tolerated    Mobility  Bed Mobility      General bed mobility comments: in recliner pre/post session  Transfers Overall transfer level: Needs assistance Equipment used: Rolling walker (2 wheeled) Transfers: Sit to/from Stand Sit to Stand: Supervision         General transfer comment: no physical assistance  Ambulation/Gait Ambulation/Gait assistance:  Supervision Gait Distance (Feet): 50 Feet Assistive device: Rolling walker (2 wheeled) Gait Pattern/deviations: Step-to pattern;Antalgic;Decreased stance time - right;Trunk flexed;Decreased step length - right;Decreased step length - left Gait velocity: decreased   General Gait Details: Pt reports improve gait abilities form previous date.      Balance Overall balance assessment: Needs assistance Sitting-balance support: Bilateral upper extremity supported Sitting balance-Leahy Scale: Good     Standing balance support: Bilateral upper extremity supported;During functional activity Standing balance-Leahy Scale: Good          Cognition Arousal/Alertness: Awake/alert Behavior During Therapy: WFL for tasks assessed/performed Overall Cognitive Status: Within Functional Limits for tasks assessed        General Comments: Pt is A and O x 4         General Comments General comments (skin integrity, edema, etc.): Reviewed HEP, precautions, car transfers and home o2 checks      Pertinent Vitals/Pain Pain Assessment: 0-10 Pain Score: 7  Pain Location: R hip Pain Descriptors / Indicators: Sore;Aching Pain Intervention(s): Limited activity within patient's tolerance;Monitored during session;Premedicated before session;Repositioned;Ice applied           PT Goals (current goals can now be found in the care plan section) Acute Rehab PT Goals Patient Stated Goal: go home Progress towards PT goals: Progressing toward goals    Frequency    BID      PT Plan Current plan remains appropriate  AM-PAC PT "6 Clicks" Mobility   Outcome Measure  Help needed turning from your back to your side while in a flat bed without using bedrails?: A Little Help needed moving from lying on your back to sitting on the side of a flat bed without using bedrails?: A Little Help needed moving to and from a bed to a chair (including a wheelchair)?: A Little Help needed standing up from a  chair using your arms (e.g., wheelchair or bedside chair)?: A Little Help needed to walk in hospital room?: A Little Help needed climbing 3-5 steps with a railing? : A Little 6 Click Score: 18    End of Session Equipment Utilized During Treatment: Gait belt;Oxygen Activity Tolerance: Patient tolerated treatment well Patient left: in chair;with SCD's reapplied;with call bell/phone within reach;with chair alarm set;with family/visitor present Nurse Communication: Mobility status;Weight bearing status;Other (comment) PT Visit Diagnosis: Other abnormalities of gait and mobility (R26.89);Muscle weakness (generalized) (M62.81);Pain Pain - Right/Left: Right Pain - part of body: Hip     Time: 0940-1010 PT Time Calculation (min) (ACUTE ONLY): 30 min  Charges:  $Gait Training: 8-22 mins $Therapeutic Activity: 8-22 mins                     Julaine Fusi PTA 01/11/20, 10:24 AM

## 2020-01-11 NOTE — TOC Transition Note (Signed)
Transition of Care [facility]) - CM/SW Discharge Note   Patient Details  Name: Pamela Dodson MRN: [id number] Date of Birth: 27-Apr-1950  Transition of [facility]) CM/SW Contact:  Magnus Ivan, LCSW Phone Number: 01/11/2020, 10:29 AM   Clinical Narrative:   Patient to discharge home today. CSW notified patient needs home o2 by PT. Home o2 referral made to [employer] Representative Leretha Dykes aware patient DC today. Home Health arranged with [facility], asked Joelene Millin to add RN services. Patient will have [place] and PT. No additional needs identified prior to DC home.    Final next level of care: [place] Barriers to Discharge: Barriers Resolved   Patient Goals and CMS Choice Patient states their goals for this hospitalization and ongoing recovery are:: home with home health CMS Medicare.gov Compare Post Acute Care list provided to:: Patient Choice offered to / list presented to : [facility]  Discharge Placement                Patient to be transferred to facility by: spouse Name of family member notified: patient aware of DC Patient and family notified of of transfer: 01/11/20  Discharge Plan and Services                DME Arranged: Oxygen DME Agency: [facility] Date DME Agency Contacted: 01/11/20   Representative spoke with at DME Agency: [place]: PT,RN [place] Date Hometown: 01/11/20   Representative spoke with at [place]: Glennis Brink  Social Determinants of Health ([place]) Interventions     Readmission Risk Interventions No flowsheet data found.

## 2020-01-11 NOTE — Progress Notes (Signed)
Pt discharged to home.  IV removed without complication.  AVS given to pt and explained with no further questions.  Oxygen delivered to pt room.  All belongings at bedside taken with pt.  Pt transported off unit via WC

## 2020-01-14 ENCOUNTER — Ambulatory Visit: Payer: Medicare Other | Admitting: Family Medicine

## 2020-01-21 ENCOUNTER — Telehealth: Payer: Self-pay | Admitting: Family Medicine

## 2020-01-21 NOTE — Telephone Encounter (Signed)
Patient declined the Medicare Wellness Visit with [facility] - stated she does not need to complete due to being seen twice a year by her provider. Was not able to explain to her about importance of having the AWV.

## 2020-01-28 NOTE — H&P (Signed)
CHIEF COMPLAINT:  Right hip pain  HISTORY:   Pamela Linders Laroccois a 70 y.o. female  with right  Hip Pain Patient complains of right hip pain. Onset of the symptoms was several years ago. Inciting event: known DJD. The patient reports the hip pain is worse with weight bearing. Associated symptoms: none. Aggravating symptoms include: any weight bearing and going up and down stairs. Patient has had no prior hip problems. Previous visits for this problem: yes, last seen several weeks ago by me. Evaluation to date: plain films, which were abnormal  osteoarthritis. Treatment to date: OTC analgesics, which have been somewhat effective, prescription analgesics, which have been somewhat effective, home exercise program, which has been somewhat effective and physical therapy, which has been somewhat effective. Plan for right total hip arthroplasty  PAST MEDICAL HISTORY:       Past Medical History:  Diagnosis Date  . Arthritis   . Blood transfusion   . Cancer (HCC)    hx thyroid cancer  . Full code status 11/02/2016  . GERD (gastroesophageal reflux disease)   . 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 Marlou Starks, II in [place]  . H/O malignant neoplasm of thyroid 08/07/2014   S/P resection x2   . History of knee replacement 08/07/2014  . Hypertension   . Hypothyroidism   . Neuromuscular disorder (HCC)    numbnes lower legs s/p knee replacements  . PONV (postoperative nausea and vomiting)    usually needs zofran/antiemetic prior to surgery  . Shingles rash 01/22/2015  . Thyroid cancer ([place])   . Wears contact lenses     PAST SURGICAL HISTORY:        Past Surgical History:  Procedure Laterality Date  . APPENDECTOMY    . CARPAL TUNNEL RELEASE Left 2016  . CARPAL TUNNEL RELEASE Right 05/05/2019  . CESAREAN SECTION    . CHOLECYSTECTOMY    . COLON SURGERY     diverticulitic mass removed  . COLONOSCOPY WITH PROPOFOL N/A 08/15/2014   Procedure: COLONOSCOPY WITH PROPOFOL;  Surgeon:  Lucilla Lame, MD;  Location: [place];  Service: Endoscopy;  Laterality: N/A;  . GANGLION CYST EXCISION     L wrist  . GLOMUS TUMOR EXCISION Left 12/12/2017  . JOINT REPLACEMENT    . knee arthroscopy    . THYROIDECTOMY  2009  . TOTAL HIP ARTHROPLASTY  12/16/2010   Procedure: TOTAL HIP ARTHROPLASTY;  Surgeon: Kerin Salen;  Location: [employer];  Service: Orthopedics;  Laterality: Left;  Depuy  . TOTAL KNEE ARTHROPLASTY  2009    MEDICATIONS:  (Not in a hospital admission)   ALLERGIES:  No Known Allergies  REVIEW OF SYSTEMS:   Negative except HPI  FAMILY HISTORY:        Family History  Problem Relation Age of Onset  . Cancer Mother        lung  . Myelodysplastic syndrome Mother   . Hypothyroidism Mother   . Hypertension Brother   . Stroke Brother   . Atrial fibrillation Brother   . Birth defects Son        congenital genetic abnormality  . Dementia Maternal Grandfather   . Heart disease Paternal Grandmother        heart disease in her 53's  . Stroke Paternal Grandmother   . Heart attack Paternal Grandfather   . Heart disease Paternal Aunt   . Heart attack Paternal Aunt   . Stroke Paternal Aunt   . Atrial fibrillation Paternal Uncle  SOCIAL HISTORY:   reports that she has never smoked. She has never used smokeless tobacco. She reports current alcohol use of about 1.0 standard drink of alcohol per week. She reports that she does not use drugs.  PHYSICAL EXAM:  General appearance: alert, cooperative and no distress Neck: no JVD and supple, symmetrical, trachea midline Resp: clear to auscultation bilaterally Cardio: regular rate and rhythm, S1, S2 normal, no murmur, click, rub or gallop GI: soft, non-tender; bowel sounds normal; no masses,  no organomegaly Extremities: extremities normal, atraumatic, no cyanosis or edema and Homans sign is negative, no sign of DVT Pulses: 2+ and symmetric Skin: Skin color, texture, turgor  normal. No rashes or lesions Neurologic: Alert and oriented X 3, normal strength and tone. Normal symmetric reflexes. Normal coordination and gait    LABORATORY STUDIES: Recent Labs (last 2 labs)   No results for input(s): WBC, HGB, HCT, PLT in the last 72 hours.                Recent Labs (last 2 labs)   No results for input(s): NA, K, CL, CO2, GLUCOSE, BUN, CREATININE, CALCIUM in the last 72 hours.    STUDIES/RESULTS:              Imaging Results  No results found.    ASSESSMENT:  End stage osteoarthritis right hip                              Active Problems:   * No active hospital problems. *    PLAN:  Right Primary Total Hip   Pamela Dodson 01/01/2020. 11:34 AM

## 2020-03-24 ENCOUNTER — Encounter: Payer: Self-pay | Admitting: Family Medicine

## 2020-03-25 NOTE — Telephone Encounter (Signed)
I reached out to pt and she will call us back to schedule

## 2020-04-01 ENCOUNTER — Ambulatory Visit: Payer: Medicare Other | Admitting: Family Medicine

## 2020-04-04 ENCOUNTER — Encounter: Payer: Self-pay | Admitting: Physician Assistant

## 2020-04-04 ENCOUNTER — Other Ambulatory Visit: Payer: Self-pay

## 2020-04-04 ENCOUNTER — Ambulatory Visit (INDEPENDENT_AMBULATORY_CARE_PROVIDER_SITE_OTHER): Payer: Medicare Other | Admitting: Physician Assistant

## 2020-04-04 VITALS — BP 120/80 | HR 97 | Temp 98.4°F | Resp 16 | Ht 63.0 in | Wt 198.0 lb

## 2020-04-04 DIAGNOSIS — E890 Postprocedural hypothyroidism: Secondary | ICD-10-CM

## 2020-04-04 DIAGNOSIS — E7800 Pure hypercholesterolemia, unspecified: Secondary | ICD-10-CM | POA: Diagnosis not present

## 2020-04-04 DIAGNOSIS — B001 Herpesviral vesicular dermatitis: Secondary | ICD-10-CM

## 2020-04-04 DIAGNOSIS — I10 Essential (primary) hypertension: Secondary | ICD-10-CM | POA: Diagnosis not present

## 2020-04-04 DIAGNOSIS — R232 Flushing: Secondary | ICD-10-CM

## 2020-04-04 DIAGNOSIS — N182 Chronic kidney disease, stage 2 (mild): Secondary | ICD-10-CM

## 2020-04-04 MED ORDER — VENLAFAXINE HCL ER 37.5 MG PO CP24: 37.5000 mg | ORAL_CAPSULE | Freq: Every day | ORAL | 1 refills | Status: DC

## 2020-04-04 MED ORDER — LEVOTHYROXINE SODIUM 175 MCG PO TABS: 175.0000 ug | ORAL_TABLET | Freq: Every day | ORAL | 1 refills | Status: DC

## 2020-04-04 MED ORDER — AMLODIPINE BESY-BENAZEPRIL HCL 10-40 MG PO CAPS: 1.0000 | ORAL_CAPSULE | Freq: Every day | ORAL | 1 refills | Status: DC

## 2020-04-04 MED ORDER — VALACYCLOVIR HCL 1 G PO TABS: ORAL_TABLET | ORAL | 0 refills | Status: DC

## 2020-04-04 NOTE — Progress Notes (Signed)
Established patient visit   Patient: Pamela Dodson   DOB: 19-Oct-1950   70 y.o. Female  MRN: [id number] Visit Date: 04/04/2020  Today's healthcare provider: Trinna Post, PA-C   Chief Complaint  Patient presents with  . Hyperlipidemia  . Hypertension    Follow up  . Hypothyroidism   Subjective    HPI HPI    Hypertension    Comments: Follow up       Last edited by Hollie Salk, [place] on 04/04/2020  1:47 PM. 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 She uses abreva which is somewhat helpful.   Hypothyroid, follow-up  Lab Results  Component Value Date   TSH 1.94 12/11/2019   TSH 0.71 03/09/2019   TSH 0.76 05/30/2018   FREET4 1.60 08/14/2014   Wt Readings from Last 3 Encounters:  04/04/20 198 lb (89.8 kg)  01/09/20 195 lb (88.5 kg)  01/02/20 198 lb 12.8 oz (90.2 kg)    She was last seen for hypothyroid 3 months ago.  Management since that visit includes continue medications. She reports excellent compliance with treatment. She is not having side effects.   Symptoms: No change in energy level No constipation  No diarrhea No heat / cold intolerance  No nervousness No palpitations  No weight changes    ----------------------------------------------------------------------------------------- Lipid/Cholesterol, Follow-up  Last lipid panel Other pertinent labs  Lab Results  Component Value Date   CHOL 136 12/11/2019   HDL 47 (L) 12/11/2019   LDLCALC 68 12/11/2019   TRIG 130 12/11/2019   CHOLHDL 2.9 12/11/2019   Lab Results  Component Value Date   ALT 18 03/14/2019   AST 17 03/14/2019   PLT 195 01/11/2020   TSH 1.94 12/11/2019     She was last seen for this 3 months ago.  Management since that visit includes continue medications.  She reports excellent compliance with treatment. She is not having side effects.   Symptoms: No chest pain No chest pressure/discomfort  No  dyspnea No lower extremity edema  No numbness or tingling of extremity No orthopnea  No palpitations No paroxysmal nocturnal dyspnea  No speech difficulty No syncope   Current diet: well balanced Current exercise: aerobics  The 10-year ASCVD risk score Mikey Bussing DC Jr., et al., 2013) is: 11.5%  --------------------------------------------------------------------------------------------------- Hypertension, follow-up  BP Readings from Last 3 Encounters:  04/04/20 120/80  01/11/20 120/61  01/02/20 (!) 142/84   Wt Readings from Last 3 Encounters:  04/04/20 198 lb (89.8 kg)  01/09/20 195 lb (88.5 kg)  01/02/20 198 lb 12.8 oz (90.2 kg)     She was last seen for hypertension 3 months ago.  BP at that visit was normal. Management since that visit includes continue medications.  She reports excellent compliance with treatment. She is not having side effects.  She is following a Regular diet. She is exercising. She does not smoke.  Use of agents associated with hypertension: none.   Outside blood pressures are not checked. Symptoms: No chest pain No chest pressure  No palpitations No syncope  No dyspnea No orthopnea  No paroxysmal nocturnal dyspnea No lower extremity edema   Pertinent labs: Lab Results  Component Value Date   CHOL 136 12/11/2019   HDL 47 (L) 12/11/2019   LDLCALC 68 12/11/2019   TRIG 130 12/11/2019   CHOLHDL 2.9 12/11/2019   Lab Results  Component Value Date  NA 136 01/10/2020   K 3.6 01/10/2020   CREATININE 0.59 01/10/2020   GFRNONAA >60 01/10/2020   GFRAA >60 03/17/2019   GLUCOSE 126 (H) 01/10/2020     The 10-year ASCVD risk score Mikey Bussing DC Jr., et al., 2013) is: 11.5%   ---------------------------------------------------------------------------------------------------     Medications: Outpatient Medications Prior to Visit  Medication Sig  . amLODipine-benazepril (LOTREL) 10-40 MG capsule Take 1 capsule by mouth at bedtime.   Marland Kitchen atorvastatin  (LIPITOR) 80 MG tablet Take 1 tablet (80 mg total) by mouth daily.  . Black Cohosh 160 MG CAPS Take by mouth.  . Cholecalciferol 1000 UNITS capsule Take 1,000 Units by mouth daily.  Marland Kitchen levothyroxine (SYNTHROID) 175 MCG tablet Take 1 tablet (175 mcg total) by mouth daily before breakfast.  . loratadine (CLARITIN) 10 MG tablet Take 10 mg by mouth daily.  . Multiple Vitamin (MULTIVITAMIN) tablet Take 1 tablet by mouth daily.  . Omega-3 Fatty Acids (FISH OIL) 1000 MG CPDR Take 1,000 mg by mouth 2 (two) times daily.  Marland Kitchen venlafaxine XR (EFFEXOR-XR) 37.5 MG 24 hr capsule Take 1 capsule (37.5 mg total) by mouth daily with breakfast.  . Ascorbic Acid (VITAMIN C) 100 MG tablet Take 100 mg by mouth daily.  Marland Kitchen docusate sodium (COLACE) 100 MG capsule Take 1 capsule (100 mg total) by mouth 2 (two) times daily. (Patient not taking: Reported on 04/04/2020)  . [DISCONTINUED] apixaban (ELIQUIS) 2.5 MG TABS tablet Take 1 tablet (2.5 mg total) by mouth every 12 (twelve) hours. (Patient not taking: Reported on 04/04/2020)  . [DISCONTINUED] methocarbamol (ROBAXIN) 500 MG tablet Take 1 tablet (500 mg total) by mouth every 6 (six) hours as needed for muscle spasms. (Patient not taking: Reported on 04/04/2020)  . [DISCONTINUED] ondansetron (ZOFRAN) 4 MG tablet Take 1 tablet (4 mg total) by mouth every 6 (six) hours as needed for nausea. (Patient not taking: Reported on 04/04/2020)   No facility-administered medications prior to visit.    Review of Systems  All other systems reviewed and are negative.      Objective    BP 120/80   Pulse 97   Temp 98.4 F (36.9 C) (Oral)   Resp 16   Ht 5\' 3"  (1.6 m)   Wt 198 lb (89.8 kg)   SpO2 98%   BMI 35.07 kg/m     Physical Exam Constitutional:      Appearance: Normal appearance.  Cardiovascular:     Rate and Rhythm: Normal rate and regular rhythm.     Heart sounds: Normal heart sounds.  Pulmonary:     Effort: Pulmonary effort is normal.     Breath sounds: Normal breath  sounds.  Skin:    General: Skin is warm and dry.  Neurological:     Mental Status: She is alert and oriented to person, place, and time. Mental status is at baseline.  Psychiatric:        Mood and Affect: Mood normal.        Behavior: Behavior normal.       No results found for any visits on 04/04/20.  Assessment & Plan       Return in about 6 months (around 10/04/2020) for chronic .      1. Cold sore  - valACYclovir (VALTREX) 1000 MG tablet; Take one pill daily for five days at first sign of cold sore. May take one pill daily for prevention.  Dispense: 90 tablet; Refill: 0  2. Hypothyroidism, postop  Continue synthroid.  3. Hypercholesterolemia without hypertriglyceridemia  Continue statin.   4. Primary hypertension  Continue BP medications.  5. Chronic kidney disease, stage II (mild)   6. Hot flashes     Trinna Post, PA-C  [facility] [phone] (phone) [phone] (fax)  [place]

## 2020-04-04 NOTE — Patient Instructions (Signed)
Cold Sore  A cold sore, also called a fever blister, is a small, fluid-filled sore that forms inside of the mouth or on the lips, gums, nose, chin, or cheeks. Cold sores can spread to other parts of the body, such as the eyes or fingers. Cold sores can spread from person to person (are contagious) until the sores crust over completely. Most cold sores go away within 2 weeks. What are the causes? Cold sores are caused by a virus (herpes simplex virus type 1, HSV-1). The virus can spread from person to person through close contact, such as through:  Kissing.  Touching the affected area.  Sharing personal items such as lip balm, razors, a drinking glass, or eating utensils. What increases the risk? You are more likely to develop this condition if you:  Are tired, stressed, or sick.  Are having your period (menstruating).  Are pregnant.  Take certain medicines.  Are out in cold weather or get too much sun. What are the signs or symptoms? Symptoms of a cold sore outbreak go through different stages. These are the stages of a cold sore:  Tingling, itching, or burning is felt 1-2 days before the outbreak.  Fluid-filled blisters appear on the lips, inside the mouth, on the nose, or on the cheeks.  The blisters start to ooze clear fluid.  The blisters dry up, and a yellow crust appears in their place.  The crust falls off. In some cases, other symptoms can develop during a cold sore outbreak. These can include:  Fever.  Sore throat.  Headache.  Muscle aches.  Swollen neck glands. How is this treated? There is no cure for cold sores or the virus that causes them. There is also no vaccine to prevent the virus. Most cold sores go away on their own without treatment within 2 weeks. Your doctor may prescribe medicines to:  Help with pain.  Keep the virus from growing.  Help you heal faster. Medicines may be in the form of creams, gels, pills, or a shot. Follow these  instructions at home: Medicines  Take or apply over-the-counter and prescription medicines only as told by your doctor.  Use a cotton-tip swab to apply creams or gels to your sores.  Ask your doctor if you can take lysine supplements. These may help with healing. Sore care  Do not touch the sores or pick the scabs.  Wash your hands often. Do not touch your eyes without washing your hands first.  Keep the sores clean and dry.  If told, put ice on the sores: ? Put ice in a plastic bag. ? Place a towel between your skin and the bag. ? Leave the ice on for 20 minutes, 2-3 times a day.   Eating and drinking  Eat a soft, bland diet. Avoid eating hot, cold, or salty foods. These can hurt your mouth.  Use a straw if it hurts to drink out of a glass.  Eat foods that have a lot of lysine in them. These include meat, fish, and dairy products.  Avoid sugary foods, chocolates, nuts, and grains. These foods have a high amount of a substance (arginine) that can cause the virus to grow. Lifestyle  Do not kiss, have oral sex, or share personal items until your sores heal.  Stress, poor sleep, and being out in the sun can trigger outbreaks. Make sure you: ? Do activities that help you relax, such as deep breathing exercises or meditation. ? Get enough sleep. ? Apply   sunscreen on your lips before you go out in the sun. Contact a doctor if:  You have symptoms for more than 2 weeks.  You have pus coming from the sores.  You have redness that is spreading.  You have pain or irritation in your eye.  You get sores on your genitals.  Your sores do not heal within 2 weeks.  You get cold sores often. Get help right away if:  You have a fever and your symptoms suddenly get worse.  You have a headache and confusion.  You have tiredness (fatigue).  You do not want to eat as much as normal (loss of appetite).  You have a stiff neck or are sensitive to light. Summary  A cold sore is  a small, fluid-filled sore that forms inside of the mouth or on the lips, gums, nose, chin, or cheeks.  Cold sores can spread from person to person (are contagious) until the sores crust over completely. Most cold sores go away within 2 weeks.  Wash your hands often. Do not touch your eyes without washing your hands first.  Do not kiss, have oral sex, or share personal items until your sores heal.  Contact a doctor if your sores do not heal within 2 weeks. This information is not intended to replace advice given to you by your health care provider. Make sure you discuss any questions you have with your health care provider. Document Revised: 04/12/2018 Document Reviewed: 05/23/2017 Elsevier Patient Education  2021 Elsevier Inc.  

## 2020-04-15 ENCOUNTER — Other Ambulatory Visit: Payer: Self-pay | Admitting: Family Medicine

## 2020-04-15 DIAGNOSIS — E7800 Pure hypercholesterolemia, unspecified: Secondary | ICD-10-CM

## 2020-06-10 ENCOUNTER — Ambulatory Visit: Payer: Medicare Other | Admitting: Family Medicine

## 2020-08-25 ENCOUNTER — Other Ambulatory Visit: Payer: Self-pay

## 2020-08-25 DIAGNOSIS — B001 Herpesviral vesicular dermatitis: Secondary | ICD-10-CM

## 2020-08-25 MED ORDER — VALACYCLOVIR HCL 1 G PO TABS
ORAL_TABLET | ORAL | 0 refills | Status: DC
Start: 2020-08-25 — End: 2020-10-26

## 2020-08-26 ENCOUNTER — Other Ambulatory Visit: Payer: Self-pay | Admitting: Emergency Medicine

## 2020-08-26 DIAGNOSIS — I10 Essential (primary) hypertension: Secondary | ICD-10-CM

## 2020-08-28 MED ORDER — AMLODIPINE BESY-BENAZEPRIL HCL 10-40 MG PO CAPS: 1.0000 | ORAL_CAPSULE | Freq: Every day | ORAL | 1 refills | Status: DC

## 2020-09-16 LAB — HM MAMMOGRAPHY

## 2020-09-22 ENCOUNTER — Other Ambulatory Visit: Payer: Self-pay | Admitting: Orthopedic Surgery

## 2020-09-22 DIAGNOSIS — R2232 Localized swelling, mass and lump, left upper limb: Secondary | ICD-10-CM

## 2020-09-24 ENCOUNTER — Telehealth: Payer: Self-pay | Admitting: Family Medicine

## 2020-09-24 NOTE — Telephone Encounter (Signed)
Pt is having an MRI on her finger tomorrow afternoon and she was prescribed Ativan by a [facility] provider but she hasnt picked it up and wanted something stronger / she also asked about if there was anything that can be injected with her contrast or help her sleep through this due to her claustrophobia / she is not able to do an open MRI/ please advise asap  [place] [id number] [address] [facility], [place] [address] [place] Phone:  [phone]  Fax:  [phone]

## 2020-09-24 NOTE — Telephone Encounter (Signed)
Pt notified, would need to contact Dr. Who prescribed and who ordered MRI.

## 2020-09-25 ENCOUNTER — Ambulatory Visit: Admission: RE | Admit: 2020-09-25 | Payer: Medicare Other | Source: Ambulatory Visit

## 2020-10-03 ENCOUNTER — Ambulatory Visit: Payer: Medicare Other | Admitting: Family Medicine

## 2020-10-03 DIAGNOSIS — R7303 Prediabetes: Secondary | ICD-10-CM

## 2020-10-03 DIAGNOSIS — I10 Essential (primary) hypertension: Secondary | ICD-10-CM

## 2020-10-03 DIAGNOSIS — E7800 Pure hypercholesterolemia, unspecified: Secondary | ICD-10-CM

## 2020-10-03 DIAGNOSIS — E669 Obesity, unspecified: Secondary | ICD-10-CM

## 2020-10-03 DIAGNOSIS — N182 Chronic kidney disease, stage 2 (mild): Secondary | ICD-10-CM

## 2020-10-03 DIAGNOSIS — Z5181 Encounter for therapeutic drug level monitoring: Secondary | ICD-10-CM

## 2020-10-07 ENCOUNTER — Encounter: Payer: Self-pay | Admitting: Family Medicine

## 2020-10-07 ENCOUNTER — Other Ambulatory Visit: Payer: Self-pay

## 2020-10-07 ENCOUNTER — Ambulatory Visit: Payer: Medicare Other | Admitting: Family Medicine

## 2020-10-07 ENCOUNTER — Ambulatory Visit (INDEPENDENT_AMBULATORY_CARE_PROVIDER_SITE_OTHER): Payer: Medicare Other | Admitting: Family Medicine

## 2020-10-07 VITALS — BP 126/76 | HR 96 | Temp 98.2°F | Resp 16 | Ht 63.0 in | Wt 202.3 lb

## 2020-10-07 DIAGNOSIS — R7303 Prediabetes: Secondary | ICD-10-CM

## 2020-10-07 DIAGNOSIS — R232 Flushing: Secondary | ICD-10-CM

## 2020-10-07 DIAGNOSIS — N951 Menopausal and female climacteric states: Secondary | ICD-10-CM

## 2020-10-07 DIAGNOSIS — I10 Essential (primary) hypertension: Secondary | ICD-10-CM

## 2020-10-07 DIAGNOSIS — E890 Postprocedural hypothyroidism: Secondary | ICD-10-CM

## 2020-10-07 DIAGNOSIS — E8881 Metabolic syndrome: Secondary | ICD-10-CM

## 2020-10-07 DIAGNOSIS — Z5181 Encounter for therapeutic drug level monitoring: Secondary | ICD-10-CM

## 2020-10-07 DIAGNOSIS — E669 Obesity, unspecified: Secondary | ICD-10-CM

## 2020-10-07 DIAGNOSIS — N182 Chronic kidney disease, stage 2 (mild): Secondary | ICD-10-CM

## 2020-10-07 DIAGNOSIS — E7800 Pure hypercholesterolemia, unspecified: Secondary | ICD-10-CM

## 2020-10-07 MED ORDER — LEVOTHYROXINE SODIUM 175 MCG PO TABS: 175.0000 ug | ORAL_TABLET | Freq: Every day | ORAL | 1 refills | Status: DC

## 2020-10-07 MED ORDER — VENLAFAXINE HCL ER 37.5 MG PO CP24: 37.5000 mg | ORAL_CAPSULE | Freq: Every day | ORAL | 1 refills | Status: DC

## 2020-10-07 NOTE — Assessment & Plan Note (Signed)
Recheck labs 

## 2020-10-07 NOTE — Assessment & Plan Note (Signed)
Recheck a1c. °

## 2020-10-07 NOTE — Assessment & Plan Note (Signed)
Doing well on current regimen, no changes made today. 

## 2020-10-07 NOTE — Assessment & Plan Note (Signed)
Counseled on aerobic exercise. Recheck labs.

## 2020-10-07 NOTE — Patient Instructions (Addendum)
It was great to see you!  Our plans for today:  - Check with your pharmacy about your Shingles vaccine and COVID booster.  - No changes to your medications. We sent refills to your pharmacy.   We are checking some labs today, we will release these results to your MyChart.  Take care and seek immediate care sooner if you develop any concerns.   Dr. Ky Barban

## 2020-10-07 NOTE — Assessment & Plan Note (Signed)
Recheck levels 

## 2020-10-07 NOTE — Progress Notes (Signed)
   SUBJECTIVE:   CHIEF COMPLAINT / HPI:   Hypertension: - Medications: amlodipine-benazepril 10-40mg  - Compliance: good - Checking BP at home: no - Denies any medication SEs, or symptoms of hypotension - Exercise: walking more  HLD - medications: lipitor 80mg  - compliance: good - medication SEs: none  Prediabetes - Last A1c 5.7 - Medications: none - Compliance: n/a - Checking BG at home: no - Denies symptoms of hypoglycemia, polyuria, polydipsia  Hypothyroidism - Medications: Synthroid 1106mcg - Current symptoms:  none - Denies change in energy level and weight changes - Symptoms have been well-controlled  Hot flashes - doing well with effexor. No interest currently in trialing off.  OBJECTIVE:   BP 126/76   Pulse 96   Temp 98.2 F (36.8 C) (Oral)   Resp 16   Ht 5\' 3"  (1.6 m)   Wt 202 lb 4.8 oz (91.8 kg)   SpO2 95%   BMI 35.84 kg/m   Gen: well appearing, in NAD Card: RRR Lungs: CTAB Ext: WWP, no edema  ASSESSMENT/PLAN:   Benign essential hypertension Doing well on current regimen, no changes made today.  Hypertension goal BP (blood pressure) < 150/90 Doing well on current regimen, no changes made today.  Hot flash, menopausal Doing well on effexor. continue  Post-surgical hypothyroidism Recheck levels.  Chronic kidney disease, stage II (mild) Recheck labs  Dysmetabolic syndrome Counseled on aerobic exercise. Recheck labs.  Prediabetes Recheck a1c     Myles Gip, DO

## 2020-10-07 NOTE — Assessment & Plan Note (Signed)
Doing well on effexor. continue

## 2020-10-08 LAB — HEMOGLOBIN A1C
Hgb A1c MFr Bld: 5.7 % of total Hgb — ABNORMAL HIGH (ref <5.7)
Mean Plasma Glucose: 117 mg/dL
eAG (mmol/L): 6.5 mmol/L

## 2020-10-08 LAB — BASIC METABOLIC PANEL
BUN: 20 mg/dL (ref 7–25)
CO2: 32 mmol/L (ref 20–32)
Calcium: 9.6 mg/dL (ref 8.6–10.4)
Chloride: 103 mmol/L (ref 98–110)
Creat: 0.7 mg/dL (ref 0.60–1.00)
Glucose, Bld: 112 mg/dL (ref 65–139)
Potassium: 4.5 mmol/L (ref 3.5–5.3)
Sodium: 143 mmol/L (ref 135–146)

## 2020-10-08 LAB — TSH: TSH: 4.3 mIU/L (ref 0.40–4.50)

## 2020-10-14 LAB — HM DEXA SCAN: HM Dexa Scan: NORMAL

## 2020-10-22 ENCOUNTER — Ambulatory Visit: Payer: Self-pay | Admitting: *Deleted

## 2020-10-22 NOTE — Telephone Encounter (Signed)
Patient called in tested positive for covid today, has cough, , congestion, wnts to know what she can take it.    Reason for Disposition  [1] HIGH RISK for severe COVID complications (e.g., weak immune system, age > 51 years, obesity with BMI > 25, pregnant, chronic lung disease or other chronic medical condition) AND [2] COVID symptoms (e.g., cough, fever)  (Exceptions: Already seen by PCP and no new or worsening symptoms.)  Answer Assessment - Initial Assessment Questions 1. COVID-19 DIAGNOSIS: "Who made your COVID-19 diagnosis?" "Was it confirmed by a positive lab test or self-test?" If not diagnosed by a doctor (or NP/PA), ask "Are there lots of cases (community spread) where you live?" Note: See public health department website, if unsure.     + home test- today 2. COVID-19 EXPOSURE: "Was there any known exposure to COVID before the symptoms began?" CDC Definition of close contact: within 6 feet (2 meters) for a total of 15 minutes or more over a 24-hour period.      unknown 3. ONSET: "When did the COVID-19 symptoms start?"      Monday-10/17 4. WORST SYMPTOM: "What is your worst symptom?" (e.g., cough, fever, shortness of breath, muscle aches)     Sinus pain 5. COUGH: "Do you have a cough?" If Yes, ask: "How bad is the cough?"       Not constant- tickle cough- can be productive 6. FEVER: "Do you have a fever?" If Yes, ask: "What is your temperature, how was it measured, and when did it start?"     98.3 today, 100.2- last night- oral 7. RESPIRATORY STATUS: "Describe your breathing?" (e.g., shortness of breath, wheezing, unable to speak)      Ok-normal 8. BETTER-SAME-WORSE: "Are you getting better, staying the same or getting worse compared to yesterday?"  If getting worse, ask, "In what way?"     same 9. HIGH RISK DISEASE: "Do you have any chronic medical problems?" (e.g., asthma, heart or lung disease, weak immune system, obesity, etc.)     Age, high blood pressure 10. VACCINE: "Have  you had the COVID-19 vaccine?" If Yes, ask: "Which one, how many shots, when did you get it?"       yes 11. BOOSTER: "Have you received your COVID-19 booster?" If Yes, ask: "Which one and when did you get it?"       3rd booster 2 weeks ago- pfizer  12. PREGNANCY: "Is there any chance you are pregnant?" "When was your last menstrual period?"       N/a 13. OTHER SYMPTOMS: "Do you have any other symptoms?"  (e.g., chills, fatigue, headache, loss of smell or taste, muscle pain, sore throat)       Chills 14. O2 SATURATION MONITOR:  "Do you use an oxygen saturation monitor (pulse oximeter) at home?" If Yes, ask "What is your reading (oxygen level) today?" "What is your usual oxygen saturation reading?" (e.g., 95%)       O2 sat- 93%  Protocols used: Coronavirus (COVID-19) Diagnosed or Suspected-A-AH

## 2020-10-22 NOTE — Telephone Encounter (Signed)
Patient is calling to report she has tested + COVID today- home test. Patient states her symptoms started Monday with sinus congestion/slight cough/low grade temp. Patient advised per COVID protocol- treatment/isolation and note sent for review- patient is in moderate risk category.

## 2020-10-23 ENCOUNTER — Encounter: Payer: Self-pay | Admitting: Unknown Physician Specialty

## 2020-10-23 ENCOUNTER — Telehealth (INDEPENDENT_AMBULATORY_CARE_PROVIDER_SITE_OTHER): Payer: Medicare Other | Admitting: Unknown Physician Specialty

## 2020-10-23 VITALS — Temp 100.2°F | Ht 63.0 in | Wt 202.0 lb

## 2020-10-23 DIAGNOSIS — U071 COVID-19: Secondary | ICD-10-CM

## 2020-10-23 MED ORDER — NIRMATRELVIR/RITONAVIR (PAXLOVID)TABLET: 3.0000 | ORAL_TABLET | Freq: Two times a day (BID) | ORAL | 0 refills | Status: AC

## 2020-10-23 NOTE — Progress Notes (Signed)
Outpatient Oral COVID Treatment Note  I connected with Pamela Dodson on 10/23/2020/1:28 PM by [facility] video and verified that I am speaking with the correct person using two identifiers.  I discussed the limitations, risks, security, and privacy concerns of performing an evaluation and management service by telephone and the availability of in person appointments. I also discussed with the patient that there may be a patient responsible charge related to this service. The patient expressed understanding and agreed to proceed.  Patient location: home Provider location: Work  Diagnosis: COVID-19 infection  Purpose of visit: Discussion of potential use of Molnupiravir or Paxlovid, a new treatment for mild to moderate COVID-19 viral infection in non-hospitalized patients.   Subjective: Patient is a 70 y.o. female who has been diagnosed with COVID 19 viral infection.  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 Marlou Starks, II in [place]   H/O malignant neoplasm of thyroid 08/07/2014   S/P resection x2    History of knee replacement 08/07/2014   Hypertension    Hypothyroidism    Neuromuscular disorder ([place])    numbnes lower legs s/p knee replacements   PONV (postoperative nausea and vomiting)    usually needs zofran/antiemetic prior to surgery   Shingles rash 01/22/2015   Thyroid cancer ([place])    Wears contact lenses     Allergies  Allergen Reactions   Toradol [Ketorolac Tromethamine] Other (See Comments)    Intolerance to NSAIDs due to CKD     Current Outpatient Medications:    amLODipine-benazepril (LOTREL) 10-40 MG  capsule, Take 1 capsule by mouth at bedtime., Disp: 90 capsule, Rfl: 1   Ascorbic Acid (VITAMIN C) 100 MG tablet, Take 100 mg by mouth daily., Disp: , Rfl:    atorvastatin (LIPITOR) 80 MG tablet, TAKE 1 TABLET BY MOUTH  DAILY, Disp: 90 tablet, Rfl: 3   Black Cohosh 160 MG CAPS, Take by mouth., Disp: , Rfl:    Cholecalciferol 1000 UNITS capsule, Take 1,000 Units by mouth daily., Disp: , Rfl:    levothyroxine (SYNTHROID) 175 MCG tablet, Take 1 tablet (175 mcg total) by mouth daily before breakfast., Disp: 90 tablet, Rfl: 1   loratadine (CLARITIN) 10 MG tablet, Take 10 mg by mouth daily., Disp: , Rfl:    Multiple Vitamin (MULTIVITAMIN) tablet, Take 1 tablet by mouth daily., Disp: , Rfl:    nirmatrelvir/ritonavir EUA (PAXLOVID) 20 x 150 MG & 10 x 100MG  TABS, Take 3 tablets by mouth 2 (two) times daily for 5 days. (Take nirmatrelvir 150 mg two tablets twice daily for 5 days and ritonavir 100 mg one tablet twice daily for 5 days) Patient GFR is WNL, Disp: 30 tablet, Rfl: 0   Omega-3 Fatty Acids (FISH OIL) 1000 MG CPDR, Take 1,000 mg by mouth 2 (two) times daily., Disp: , Rfl:    valACYclovir (VALTREX) 1000 MG tablet, Take one pill daily for five days at first sign of cold sore. May take one pill daily for prevention., Disp: 90 tablet, Rfl: 0   venlafaxine XR (EFFEXOR-XR) 37.5 MG 24 hr capsule,  Take 1 capsule (37.5 mg total) by mouth daily with breakfast., Disp: 90 capsule, Rfl: 1  Objective: Patient appears/sounds congested but well.  They are in no apparent distress.  Breathing is non labored.  Mood and behavior are normal.  Laboratory Data:  Recent Results (from the past 2160 hour(s))  HM MAMMOGRAPHY     Status: None   Collection Time: 09/16/20 12:00 AM  Result Value Ref Range   HM Mammogram 0-4 Bi-Rad 0-4 Bi-Rad, Self Reported Normal    Comment: [facility] everywhere  Basic Metabolic Panel (BMET)     Status: None   Collection Time: 10/07/20 11:40 AM  Result Value Ref Range   Glucose, Bld 112 65 -  139 mg/dL    Comment: .        Non-fasting reference interval .    BUN 20 7 - 25 mg/dL   Creat 0.70 0.60 - 1.00 mg/dL   BUN/Creatinine Ratio NOT APPLICABLE 6 - 22 (calc)   Sodium 143 135 - 146 mmol/L   Potassium 4.5 3.5 - 5.3 mmol/L   Chloride 103 98 - 110 mmol/L   CO2 32 20 - 32 mmol/L   Calcium 9.6 8.6 - 10.4 mg/dL  TSH     Status: None   Collection Time: 10/07/20 11:40 AM  Result Value Ref Range   TSH 4.30 0.40 - 4.50 mIU/L  Hemoglobin A1c     Status: Abnormal   Collection Time: 10/07/20 11:40 AM  Result Value Ref Range   Hgb A1c MFr Bld 5.7 (H) <5.7 % of total Hgb    Comment: For someone without known diabetes, a hemoglobin  A1c value between 5.7% and 6.4% is consistent with prediabetes and should be confirmed with a  follow-up test. . For someone with known diabetes, a value <7% indicates that their diabetes is well controlled. A1c targets should be individualized based on duration of diabetes, age, comorbid conditions, and other considerations. . This assay result is consistent with an increased risk of diabetes. . Currently, no consensus exists regarding use of hemoglobin A1c for diagnosis of diabetes for children. .    Mean Plasma Glucose 117 mg/dL   eAG (mmol/L) 6.5 mmol/L     Assessment: 70 y.o. female with mild/moderate COVID 19 viral infection diagnosed on 10/17 at high risk for progression to severe COVID 19.  Plan:  This patient is a 70 y.o. female that meets the following criteria for Emergency Use Authorization of: Paxlovid 1. Age >12 yr AND > 40 kg 2. SARS-COV-2 positive test 3. Symptom onset < 5 days 4. Mild-to-moderate COVID disease with high risk for severe progression to hospitalization or death  I have spoken and communicated the following to the patient or parent/caregiver regarding: Paxlovid is an unapproved drug that is authorized for use under an Emergency Use Authorization.  There are no adequate, approved, available products for the  treatment of COVID-19 in adults who have mild-to-moderate COVID-19 and are at high risk for progressing to severe COVID-19, including hospitalization or death. Other therapeutics are currently authorized. For additional information on all products authorized for treatment or prevention of COVID-19, please see [employer].  There are benefits and risks of taking this treatment as outlined in the "Fact Sheet for Patients and Caregivers."  "Fact Sheet for Patients and Caregivers" was reviewed with patient. A hard copy will be provided to patient from pharmacy prior to the patient receiving treatment. Patients should continue to self-isolate and use infection control measures (e.g., wear mask, isolate, social  distance, avoid sharing personal items, clean and disinfect "high touch" surfaces, and frequent handwashing) according to CDC guidelines.  The patient or parent/caregiver has the option to accept or refuse treatment. Patient medication history was reviewed for potential drug interactions:Atorvastatin she will stop taking temporarily Patient's GFR was calculated to be >60 and they were therefore prescribed Normal dose (GFR>60) - nirmatrelvir 150mg  tab (2 tablet) by mouth twice daily AND ritonavir 100mg  tab (1 tablet) by mouth twice daily  After reviewing above information with the patient, the patient agrees to receive Paxlovid.  Follow up instructions:    Take prescription BID x 5 days as directed Reach out to pharmacist for counseling on medication if desired For concerns regarding further COVID symptoms please follow up with your PCP or urgent care For urgent or life-threatening issues, seek care at your local emergency department  The patient was provided an opportunity to ask questions, and all were answered. The patient agreed with the plan and demonstrated an understanding of the  instructions.   S  The patient was advised to call their PCP or seek an in-person evaluation if the symptoms worsen or if the condition fails to improve as anticipated.   I provided 20 minutes of face-to-face video visit time during this encounter, and > 50% was spent counseling as documented under my assessment & plan.  Kathrine Haddock, NP 10/23/2020 /1:28 PM

## 2020-10-26 ENCOUNTER — Other Ambulatory Visit: Payer: Self-pay | Admitting: Family Medicine

## 2020-10-26 DIAGNOSIS — B001 Herpesviral vesicular dermatitis: Secondary | ICD-10-CM

## 2020-10-26 NOTE — Telephone Encounter (Signed)
Requested Prescriptions  Pending Prescriptions Disp Refills  . valACYclovir (VALTREX) 1000 MG tablet [Pharmacy Med Name: valACYclovir HCl 1 GM Oral Tablet] 90 tablet 3    Sig: TAKE 1 TABLET BY MOUTH  DAILY FOR 5 DAYS AT FIRST  SIGN OF COLD SORE. MAY TAKE 1 TABLET BY MOUTH DAILY FOR PREVENTION     Antimicrobials:  Antiviral Agents - Anti-Herpetic Passed - 10/26/2020  8:09 AM      Passed - Valid encounter within last 12 months    Recent Outpatient Visits          3 days ago COVID-19   [facility] Kathrine Haddock, NP   2 weeks ago Hypertension goal BP (blood pressure) < 150/90   [place], DO   6 months ago Cold sore   [place], [place], [place]   10 months ago Hypothyroidism, postop   [facility] Delsa Grana, PA-C   1 year ago Hypothyroidism, postop   [facility] Delsa Grana, [place]

## 2021-01-06 ENCOUNTER — Other Ambulatory Visit: Payer: Self-pay

## 2021-01-06 ENCOUNTER — Encounter: Payer: Self-pay | Admitting: Internal Medicine

## 2021-01-06 ENCOUNTER — Ambulatory Visit (INDEPENDENT_AMBULATORY_CARE_PROVIDER_SITE_OTHER): Payer: Medicare Other | Admitting: Internal Medicine

## 2021-01-06 VITALS — BP 124/70 | HR 98 | Temp 98.8°F | Resp 16 | Ht 63.0 in | Wt 204.0 lb

## 2021-01-06 DIAGNOSIS — M810 Age-related osteoporosis without current pathological fracture: Secondary | ICD-10-CM | POA: Diagnosis not present

## 2021-01-06 DIAGNOSIS — R051 Acute cough: Secondary | ICD-10-CM | POA: Diagnosis not present

## 2021-01-06 MED ORDER — BUDESONIDE 90 MCG/ACT IN AEPB: 1.0000 | INHALATION_SPRAY | Freq: Two times a day (BID) | RESPIRATORY_TRACT | 1 refills | Status: DC

## 2021-01-06 MED ORDER — BENZONATATE 100 MG PO CAPS: 100.0000 mg | ORAL_CAPSULE | Freq: Two times a day (BID) | ORAL | 0 refills | Status: DC | PRN

## 2021-01-06 MED ORDER — METHYLPREDNISOLONE 4 MG PO TBPK: ORAL_TABLET | ORAL | 0 refills | Status: DC

## 2021-01-06 NOTE — Progress Notes (Signed)
Acute Office Visit  Subjective:    Patient ID: Pamela Dodson, female    DOB: [birth year], 71 y.o.   MRN: [id number]  Chief Complaint  Patient presents with   Cough    Pt has tested twice for COVID-Negative. Pt states has been having a lot of phlegm.    HPI Patient is in today for URI symptoms. Symptoms started 12/24/20.  URI Compliant:  -Worst symptom: cough -Fever: yes 100.8 - 101 x 4 days -Cough: yes, productive green, thick. Worse with laying down  -Shortness of breath: no -Wheezing: yes -Chest pain: no -Chest tightness: no -Chest congestion: yes -Nasal congestion: no -Runny nose: no -Post nasal drip: no -Sore throat: no -Sinus pressure: no -Headache: no -Face pain: no -Ear pain: no  -Ear pressure: no  -Vomiting: no -Sick contacts: yes -Context: worse -Recurrent sinusitis: no -Relief with OTC cold/cough medications: no  -Treatments attempted: mucinex and cough syrup   Osteoporosis: Also wanted to discuss most recent DEXA results from 10/22.  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 Marlou Starks, II in [place]   H/O malignant neoplasm of thyroid 08/07/2014   S/P resection x2    History of knee replacement 08/07/2014   Hypertension    Hypothyroidism    Neuromuscular disorder ([place])    numbnes lower legs s/p knee replacements   PONV (postoperative nausea and vomiting)    usually needs zofran/antiemetic prior to surgery   Shingles rash 01/22/2015   Thyroid cancer ([place])    Wears contact lenses     Past Surgical History:  Procedure Laterality Date   APPENDECTOMY      CARPAL TUNNEL RELEASE Left 2016   CARPAL TUNNEL RELEASE Right 05/05/2019   CESAREAN SECTION     X2   CHOLECYSTECTOMY     COLON SURGERY     diverticulitic mass removed AND COLOSTOMY    COLONOSCOPY WITH PROPOFOL N/A 08/15/2014   Procedure: COLONOSCOPY WITH PROPOFOL;  Surgeon: Lucilla Lame, MD;  Location: [facility];  Service: Endoscopy;  Laterality: N/A;   COLOSTOMY REVERSAL     GANGLION CYST EXCISION     L wrist   GLOMUS TUMOR EXCISION Left 12/12/2017   JOINT REPLACEMENT     knee arthroscopy     THYROIDECTOMY  2009   TOTAL HIP ARTHROPLASTY  12/16/2010   Procedure: TOTAL HIP ARTHROPLASTY;  Surgeon: Kerin Salen;  Location: [facility];  Service: Orthopedics;  Laterality: Left;  [place]   TOTAL HIP ARTHROPLASTY Right 01/09/2020   Procedure: TOTAL HIP ARTHROPLASTY ANTERIOR APPROACH;  Surgeon: Lovell Sheehan, MD;  Location: [facility];  Service: Orthopedics;  Laterality: Right;   TOTAL KNEE ARTHROPLASTY Bilateral 2009    Family History  Problem Relation Age of Onset   Cancer Mother        lung   Myelodysplastic syndrome Mother    Hypothyroidism Mother    Hypertension Brother    Stroke Brother    Atrial fibrillation Brother    Birth defects Son        congenital  genetic abnormality   Dementia Maternal Grandfather    Heart disease Paternal Grandmother        heart disease in her 52's   Stroke Paternal Grandmother    Heart attack Paternal Grandfather    Heart disease Paternal Aunt    Heart attack Paternal Aunt    Stroke Paternal Aunt    Atrial fibrillation Paternal Uncle     Social History   Socioeconomic History   Marital status: Married    Spouse name: Not on file   Number of children: 2   Years of education: Not on file   Highest education level: Not on file  Occupational History   Not on file  Tobacco Use   Smoking status: Never   Smokeless tobacco: Never  Vaping Use   Vaping Use: Never used  Substance and Sexual Activity   Alcohol use: Yes    Alcohol/week: 1.0  standard drink    Types: 1 Glasses of wine per week    Comment: occasional   Drug use: No   Sexual activity: Yes    Partners: Male  Other Topics Concern   Not on file  Social History Narrative   Not on file   Social Determinants of Health   Financial Resource Strain: Not on file  Food Insecurity: Not on file  Transportation Needs: Not on file  Physical Activity: Not on file  Stress: Not on file  Social Connections: Not on file  Intimate Partner Violence: Not on file    Outpatient Medications Prior to Visit  Medication Sig Dispense Refill   amLODipine-benazepril (LOTREL) 10-40 MG capsule Take 1 capsule by mouth at bedtime. 90 capsule 1   Ascorbic Acid (VITAMIN C) 100 MG tablet Take 100 mg by mouth daily.     atorvastatin (LIPITOR) 80 MG tablet TAKE 1 TABLET BY MOUTH  DAILY 90 tablet 3   Black Cohosh 160 MG CAPS Take by mouth.     Cholecalciferol 1000 UNITS capsule Take 1,000 Units by mouth daily.     levothyroxine (SYNTHROID) 175 MCG tablet Take 1 tablet (175 mcg total) by mouth daily before breakfast. 90 tablet 1   loratadine (CLARITIN) 10 MG tablet Take 10 mg by mouth daily.     Multiple Vitamin (MULTIVITAMIN) tablet Take 1 tablet by mouth daily.     Omega-3 Fatty Acids (FISH OIL) 1000 MG CPDR Take 1,000 mg by mouth 2 (two) times daily.     valACYclovir (VALTREX) 1000 MG tablet TAKE 1 TABLET BY MOUTH  DAILY FOR 5 DAYS AT FIRST  SIGN OF COLD SORE. MAY TAKE 1 TABLET BY MOUTH DAILY FOR PREVENTION 90 tablet 3   venlafaxine XR (EFFEXOR-XR) 37.5 MG 24 hr capsule Take 1 capsule (37.5 mg total) by mouth daily with breakfast. 90 capsule 1   No facility-administered medications prior to visit.    Allergies  Allergen Reactions   Toradol [Ketorolac Tromethamine] Other (See Comments)    Intolerance to NSAIDs due to CKD    Review of Systems  Constitutional:  Positive for chills and fever.  HENT:  Negative for congestion, postnasal drip, rhinorrhea, sinus pressure and sinus pain.    Respiratory:  Positive for cough and wheezing. Negative for shortness of breath.   Cardiovascular:  Negative for chest pain.  Gastrointestinal:  Negative for diarrhea, nausea and vomiting.  Neurological:  Negative for dizziness and headaches.      Objective:    Physical Exam Constitutional:      Appearance: Normal appearance.  HENT:  Head: Normocephalic and atraumatic.  Cardiovascular:     Rate and Rhythm: Normal rate and regular rhythm.  Pulmonary:     Effort: Pulmonary effort is normal.     Breath sounds: Wheezing present. No rhonchi or rales.  Musculoskeletal:     Right lower leg: No edema.     Left lower leg: No edema.  Skin:    General: Skin is warm and dry.  Neurological:     General: No focal deficit present.     Mental Status: She is alert. Mental status is at baseline.  Psychiatric:        Mood and Affect: Mood normal.        Behavior: Behavior normal.    BP 124/70    Pulse 98    Temp 98.8 F (37.1 C) (Oral)    Resp 16    Ht 5\' 3"  (1.6 m)    Wt 204 lb (92.5 kg)    SpO2 96%    BMI 36.14 kg/m  Wt Readings from Last 3 Encounters:  01/06/21 204 lb (92.5 kg)  10/23/20 202 lb (91.6 kg)  10/07/20 202 lb 4.8 oz (91.8 kg)    Health Maintenance Due  Topic Date Due   Zoster Vaccines- Shingrix (1 of 2) Never done   COVID-19 Vaccine (3 - Booster for Pfizer series) 04/12/2019   DEXA SCAN  08/06/2019   INFLUENZA VACCINE  08/04/2020    There are no preventive care reminders to display for this patient.   Lab Results  Component Value Date   TSH 4.30 10/07/2020   Lab Results  Component Value Date   WBC 8.8 01/11/2020   HGB 11.0 (L) 01/11/2020   HCT 33.1 (L) 01/11/2020   MCV 97.1 01/11/2020   PLT 195 01/11/2020   Lab Results  Component Value Date   NA 143 10/07/2020   K 4.5 10/07/2020   CO2 32 10/07/2020   GLUCOSE 112 10/07/2020   BUN 20 10/07/2020   CREATININE 0.70 10/07/2020   BILITOT 0.7 03/14/2019   ALKPHOS 58 03/14/2019   AST 17 03/14/2019    ALT 18 03/14/2019   PROT 6.4 (L) 03/14/2019   ALBUMIN 3.4 (L) 03/14/2019   CALCIUM 9.6 10/07/2020   ANIONGAP 8 01/10/2020   Lab Results  Component Value Date   CHOL 136 12/11/2019   Lab Results  Component Value Date   HDL 47 (L) 12/11/2019   Lab Results  Component Value Date   LDLCALC 68 12/11/2019   Lab Results  Component Value Date   TRIG 130 12/11/2019   Lab Results  Component Value Date   CHOLHDL 2.9 12/11/2019   Lab Results  Component Value Date   HGBA1C 5.7 (H) 10/07/2020       Assessment & Plan:   1. Acute cough: COVID swab today. As she is going out of town in a few days, a chest x-ray will be obtained to rule out PNA. In the meantime she will be sent a steroid inhaler and Medrol dosepack as well as cough suppressant and Mucinex to help clear chest congestion. Follow up as needed.  - Novel Coronavirus, NAA ([facility]) - Budesonide 90 MCG/ACT inhaler; Inhale 1 puff into the lungs 2 (two) times daily.  Dispense: 1 each; Refill: 1 - methylPREDNISolone (MEDROL DOSEPAK) 4 MG TBPK tablet; Day 1: Take 8 mg (2 tablets) before breakfast, 4 mg (1 tablet) after lunch, 4 mg (1 tablet) after supper, and 8 mg (2 tablets) at bedtime. Day 2:Take 4 mg (1  tablet) before breakfast, 4 mg (1 tablet) after lunch, 4 mg (1 tablet) after supper, and 8 mg (2 tablets) at bedtime. Day 3: Take 4 mg (1 tablet) before breakfast, 4 mg (1 tablet) after lunch, 4 mg (1 tablet) after supper, and 4 mg (1 tablet) at bedtime. Day 4: Take 4 mg (1 tablet) before breakfast, 4 mg (1 tablet) after lunch, and 4 mg (1 tablet) at bedtime. Day 5: Take 4 mg (1 tablet) before breakfast and 4 mg (1 tablet) at bedtime. Day 6: Take 4 mg (1 tablet) before breakfast.  Dispense: 1 each; Refill: 0 - benzonatate (TESSALON) 100 MG capsule; Take 1 capsule (100 mg total) by mouth 2 (two) times daily as needed for cough.  Dispense: 20 capsule; Refill: 0 - DG Chest 2 View; Future  2. Osteoporosis without current pathological  fracture, unspecified osteoporosis type: Reviewed DEXA results in detail, showing osteoporosis in lumbar spine. Patient uncertain about taking bisphosphonates, medication information provided. Discussed taking Vitamin D 1000 IU, calcium and weight bearing exercise.    Teodora Medici, DO

## 2021-01-06 NOTE — Patient Instructions (Addendum)
It was great seeing you today!  Plan discussed at today's visit: -Use steroid inhaler 1-2 puffs a day while symptoms are present -Also use Medrol dosepack (oral steroids) to help open airways -Use Mucinex over the counter [phone] mg daily to help loosen phlegm, but this will make you cough more -If cough bothering you at night, can take cough suppressant  -Chest x-ray today as well  -COVID swab today as well  -Take Vitamin D at least 1000 IU daily and calcium citrate daily, also recommend weight bearing exercise  Follow up in: if symptoms worsen or fail to improve  Take care and let us know if you have any questions or concerns prior to your next visit.  Dr. Rosana Berger  Osteoporosis Osteoporosis happens when the bones become thin and less dense than normal. Osteoporosis makes bones more brittle and fragile and more likely to break (fracture). Over time, osteoporosis can cause your bones to become so weak that they fracture after a minor fall. Bones in the hip, wrist, and spine are most likely to fracture due to osteoporosis. What are the causes? The exact cause of this condition is not known. What increases the risk? You are more likely to develop this condition if you: Have family members with this condition. Have poor nutrition. Use the following: Steroid medicines, such as prednisone. Anti-seizure medicines. Nicotine or tobacco, such as cigarettes, e-cigarettes, and chewing tobacco. Are female. Are age 84 or older. Are not physically active (are sedentary). Are of European or Asian descent. Have a small body frame. What are the signs or symptoms? A fracture might be the first sign of osteoporosis, especially if the fracture results from a fall or injury that usually would not cause a bone to break. Other signs and symptoms include: Pain in the neck or low back. Stooped posture. Loss of height. How is this diagnosed? This condition may be diagnosed based on: Your medical  history. A physical exam. A bone mineral density test, also called a DXA or DEXA test (dual-energy X-ray absorptiometry test). This test uses X-rays to measure the amount of minerals in your bones. How is this treated? This condition may be treated by: Making lifestyle changes, such as: Including foods with more calcium and vitamin D in your diet. Doing weight-bearing and muscle-strengthening exercises. Stopping tobacco use. Limiting alcohol intake. Taking medicine to slow the process of bone loss or to increase bone density. Taking daily supplements of calcium and vitamin D. Taking hormone replacement medicines, such as estrogen for women and testosterone for men. Monitoring your levels of calcium and vitamin D. The goal of treatment is to strengthen your bones and lower your risk for a fracture. Follow these instructions at home: Eating and drinking Include calcium and vitamin D in your diet. Calcium is important for bone health, and vitamin D helps your body absorb calcium. Good sources of calcium and vitamin D include: Certain fatty fish, such as salmon and tuna. Products that have calcium and vitamin D added to them (are fortified), such as fortified cereals. Egg yolks. Cheese. Liver.  Activity Do exercises as told by your health care provider. Ask your health care provider what exercises and activities are safe for you. You should do: Exercises that make you work against gravity (weight-bearing exercises), such as tai chi, yoga, or walking. Exercises to strengthen muscles, such as lifting weights. Lifestyle Do not drink alcohol if: Your health care provider tells you not to drink. You are pregnant, may be pregnant, or are planning  to become pregnant. If you drink alcohol: Limit how much you use to: 0-1 drink a day for women. 0-2 drinks a day for men. Know how much alcohol is in your drink. In the [place] one drink equals one 12 oz bottle of beer (355 mL), one 5 oz glass of  wine (148 mL), or one 1 oz glass of hard liquor (44 mL). Do not use any products that contain nicotine or tobacco, such as cigarettes, e-cigarettes, and chewing tobacco. If you need help quitting, ask your health care provider. Preventing falls Use devices to help you move around (mobility aids) as needed, such as canes, walkers, scooters, or crutches. Keep rooms well-lit and clutter-free. Remove tripping hazards from walkways, including cords and throw rugs. Install grab bars in bathrooms and safety rails on stairs. Use rubber mats in the bathroom and other areas that are often wet or slippery. Wear closed-toe shoes that fit well and support your feet. Wear shoes that have rubber soles or low heels. Review your medicines with your health care provider. Some medicines can cause dizziness or changes in blood pressure, which can increase your risk of falling. General instructions Take over-the-counter and prescription medicines only as told by your health care provider. Keep all follow-up visits. This is important. Contact a health care provider if: You have never been screened for osteoporosis and you are: A woman who is age 51 or older. A man who is age 22 or older. Get help right away if: You fall or injure yourself. Summary Osteoporosis is thinning and loss of density in your bones. This makes bones more brittle and fragile and more likely to break (fracture),even with minor falls. The goal of treatment is to strengthen your bones and lower your risk for a fracture. Include calcium and vitamin D in your diet. Calcium is important for bone health, and vitamin D helps your body absorb calcium. Talk with your health care provider about screening for osteoporosis if you are a woman who is age 32 or older, or a man who is age 44 or older. This information is not intended to replace advice given to you by your health care provider. Make sure you discuss any questions you have with your health  care provider. Document Revised: 06/07/2019 Document Reviewed: 06/07/2019 Elsevier Patient Education  [place].  Alendronate Tablets What is this medication? ALENDRONATE (a LEN droe nate) prevents and treats osteoporosis. It may also be used to treat Paget disease of the bone. It works by Paramedic stronger and less likely to break (fracture). It belongs to a group of medications called bisphosphonates. This medicine may be used for other purposes; ask your health care provider or pharmacist if you have questions. COMMON BRAND NAME(S): Fosamax What should I tell my care team before I take this medication? They need to know if you have any of these conditions: Bleeding disorder Cancer Dental disease Difficulty swallowing Infection (fever, chills, cough, sore throat, pain or trouble passing urine) Kidney disease Low levels of calcium or other minerals in the blood Low red blood cell counts Receiving steroids like dexamethasone or prednisone Stomach or intestine problems Trouble sitting or standing for 30 minutes An unusual or allergic reaction to alendronate, other medications, foods, dyes or preservatives Pregnant or trying to get pregnant Breast-feeding How should I use this medication? Take this medication by mouth with a full glass of water. Take it as directed on the prescription label at the same time every day. Take the  dose right after waking up. Do not eat or drink anything before taking it. Do not take it with any other drink except water. Do not chew or crush the tablet. After taking it, do not eat breakfast, drink, or take any other medications or vitamins for at least 30 minutes. Sit or stand up for at least 30 minutes after you take it. Do not lie down. Keep taking it unless your care team tells you to stop. A special MedGuide will be given to you by the pharmacist with each prescription and refill. Be sure to read this information carefully each time. Talk  to your care team about the use of this medication in children. Special care may be needed. Overdosage: If you think you have taken too much of this medicine contact a poison control center or emergency room at once. NOTE: This medicine is only for you. Do not share this medicine with others. What if I miss a dose? If you take your medication once a day, skip it. Take your next dose at the scheduled time the next morning. Do not take two doses on the same day. If you take your medication once a week, take the missed dose on the morning after you remember. Do not take two doses on the same day. What may interact with this medication? Aluminum hydroxide Antacids Aspirin Calcium supplements Medications for inflammation like ibuprofen, naproxen, and others Iron supplements Magnesium supplements Vitamins with minerals This list may not describe all possible interactions. Give your health care provider a list of all the medicines, herbs, non-prescription drugs, or dietary supplements you use. Also tell them if you smoke, drink alcohol, or use illegal drugs. Some items may interact with your medicine. What should I watch for while using this medication? Visit your care team for regular checks on your progress. It may be some time before you see the benefit from this medication. Some people who take this medication have severe bone, joint, or muscle pain. This medication may also increase your risk for jaw problems or a broken thigh bone. Tell your care team right away if you have severe pain in your jaw, bones, joints, or muscles. Tell you care team if you have any pain that does not go away or that gets worse. Tell your dentist and dental surgeon that you are taking this medication. You should not have major dental surgery while on this medication. See your dentist to have a dental exam and fix any dental problems before starting this medication. Take good care of your teeth while on this medication.  Make sure you see your dentist for regular follow-up appointments. You should make sure you get enough calcium and vitamin D while you are taking this medication. Discuss the foods you eat and the vitamins you take with your care team. You may need blood work done while you are taking this medication. What side effects may I notice from receiving this medication? Side effects that you should report to your care team as soon as possible: Allergic reactions--skin rash, itching, hives, swelling of the face, lips, tongue, or throat Low calcium level--muscle pain or cramps, confusion, tingling, or numbness in the hands or feet Osteonecrosis of the jaw--pain, swelling, or redness in the mouth, numbness of the jaw, poor healing after dental work, unusual discharge from the mouth, visible bones in the mouth Pain or trouble swallowing Severe bone, joint, or muscle pain Stomach bleeding--bloody or black, tar-like stools, vomiting blood or brown material that looks like  coffee grounds Side effects that usually do not require medical attention (report to your care team if they continue or are bothersome): Constipation Diarrhea Nausea Stomach pain This list may not describe all possible side effects. Call your doctor for medical advice about side effects. You may report side effects to FDA at 1-800-FDA-1088. Where should I keep my medication? Keep out of the reach of children and pets. Store at room temperature between 15 and 30 degrees C (59 and 86 degrees F). Throw away any unused medication after the expiration date. NOTE: This sheet is a summary. It may not cover all possible information. If you have questions about this medicine, talk to your doctor, pharmacist, or health care provider.  2022 Elsevier/Gold Standard (2020-01-03 00:00:00)

## 2021-01-07 ENCOUNTER — Telehealth: Payer: Self-pay

## 2021-01-07 ENCOUNTER — Telehealth: Payer: Self-pay | Admitting: Family Medicine

## 2021-01-07 DIAGNOSIS — R051 Acute cough: Secondary | ICD-10-CM

## 2021-01-07 MED ORDER — FLOVENT DISKUS 50 MCG/ACT IN AEPB: 1.0000 | INHALATION_SPRAY | Freq: Every day | RESPIRATORY_TRACT | 1 refills | Status: DC

## 2021-01-07 NOTE — Telephone Encounter (Signed)
Copied from [place] [id number]. Topic: General - Call Back - No Documentation >> Jan 07, 2021  1:51 PM Scherrie Gerlach wrote: Reason for CRM: pt would like results of covid test and chest xray.

## 2021-01-07 NOTE — Telephone Encounter (Signed)
Medication Refill - Medication: Pulmicort Pt stated her insurance will not cover this medication. Pt asking to have this sent today needs medication  Has the patient contacted their pharmacy? Yes.   Pharmacy advised pt to contact PCP.  (Agent: If yes, when and what did the pharmacy advise?)  Preferred Pharmacy (with phone number or street name):   [facility] [place], [place] - [address]  [place] [address]  Phone: [phone] Fax: [phone]  Hours: Not open 24 hours     Has the patient been seen for an appointment in the last year OR does the patient have an upcoming appointment? Yes.    Agent: Please be advised that RX refills may take up to 3 business days. We ask that you follow-up with your pharmacy.

## 2021-01-07 NOTE — Addendum Note (Signed)
Addended by: Teodora Medici on: 01/07/2021 04:28 PM   Modules accepted: Orders

## 2021-01-07 NOTE — Telephone Encounter (Signed)
Inhaler not covered it cost $200 can you call in something else patient states she really needs?

## 2021-01-08 ENCOUNTER — Telehealth: Payer: Self-pay

## 2021-01-08 ENCOUNTER — Encounter: Payer: Self-pay | Admitting: Family Medicine

## 2021-01-08 LAB — NOVEL CORONAVIRUS, NAA: SARS-CoV-2, NAA: DETECTED — AB

## 2021-01-08 LAB — SARS-COV-2, NAA 2 DAY TAT

## 2021-01-08 NOTE — Telephone Encounter (Signed)
Please review CXR she had done through [facility].  I pulled up in care everywhere.  Lungs were normal but was abnormal for heart

## 2021-01-12 ENCOUNTER — Encounter: Payer: Self-pay | Admitting: Family Medicine

## 2021-01-16 ENCOUNTER — Telehealth: Payer: Self-pay

## 2021-01-16 ENCOUNTER — Other Ambulatory Visit: Payer: Self-pay | Admitting: Family Medicine

## 2021-01-16 ENCOUNTER — Encounter: Payer: Self-pay | Admitting: Family Medicine

## 2021-01-16 DIAGNOSIS — I10 Essential (primary) hypertension: Secondary | ICD-10-CM

## 2021-01-16 DIAGNOSIS — R051 Acute cough: Secondary | ICD-10-CM

## 2021-01-16 MED ORDER — FLOVENT DISKUS 50 MCG/ACT IN AEPB: 1.0000 | INHALATION_SPRAY | Freq: Every day | RESPIRATORY_TRACT | 1 refills | Status: DC

## 2021-01-16 MED ORDER — IPRATROPIUM-ALBUTEROL 0.5-2.5 (3) MG/3ML IN SOLN: 3.0000 mL | RESPIRATORY_TRACT | 3 refills | Status: DC | PRN

## 2021-01-16 NOTE — Telephone Encounter (Signed)
Requested Prescriptions  Pending Prescriptions Disp Refills   Fluticasone Propionate, Inhal, (FLOVENT DISKUS) 50 MCG/ACT AEPB 1 each 1    Sig: Inhale 1 puff into the lungs daily.     Pulmonology:  Corticosteroids Passed - 01/16/2021  3:18 PM      Passed - Valid encounter within last 12 months    Recent Outpatient Visits          1 week ago Acute cough   [facility] Teodora Medici, DO   2 months ago COVID-19   [facility] [place], [place], NP   3 months ago Hypertension goal BP (blood pressure) < 150/90   [place], DO   9 months ago Cold sore   [place], [place]   1 year ago Hypothyroidism, postop   [facility] Delsa Grana, [place]

## 2021-01-16 NOTE — Telephone Encounter (Signed)
1442 - called and spoke with Tillie Rung, [facility] who states pt doesn't have to have rx there, she can just come and pick up the machine and they do have them in stock.  10- [facility] and [facility] machine. Pt gave me location that is closer that she wanted it sent to. Advised pt will call [place], [phone] and give them order. Pt was going to call [facility] location and cancel  1434 - [facility] [place] called and was able to give verbal order for machine and they do have it in stock. He stated it will be ready in 30 mins.   1429- [id number] states they do not have neb machine in stock. Gave me another [facility] location number [phone].   7510- Pt called, she states she was able to get the neb tx medicine but [facility] didn't have the machine in stock. [facility] is [facility]. Advised her I will call and see if they have it in stock and call her back.

## 2021-01-16 NOTE — Telephone Encounter (Signed)
Address for [facility] is [address], , [place], [address]

## 2021-01-16 NOTE — Telephone Encounter (Signed)
Patient called in saying inhaler can be sent to  [facility], [facility] [address]. She just had the phone number [phone]

## 2021-01-16 NOTE — Telephone Encounter (Signed)
Erroneous

## 2021-01-16 NOTE — Telephone Encounter (Signed)
Called pt and I was told she had found a location that she did not need a rx sent over. Pt stated she was going to drive 14 miles towards the location where she found they had it in stock. I informed pt if actually needed something sent over to give [place] a call back.

## 2021-01-16 NOTE — Telephone Encounter (Signed)
Pharmacy didn't receive the nebulizer machine and they dont have one / so pt asked if one can be sent to  [facility] asap / pt needs this asap

## 2021-03-19 ENCOUNTER — Other Ambulatory Visit: Payer: Self-pay | Admitting: Family Medicine

## 2021-03-23 ENCOUNTER — Encounter: Payer: Self-pay | Admitting: Family Medicine

## 2021-03-23 ENCOUNTER — Telehealth (INDEPENDENT_AMBULATORY_CARE_PROVIDER_SITE_OTHER): Payer: Medicare Other | Admitting: Family Medicine

## 2021-03-23 ENCOUNTER — Encounter: Payer: Self-pay | Admitting: Internal Medicine

## 2021-03-23 VITALS — Temp 101.8°F

## 2021-03-23 DIAGNOSIS — J069 Acute upper respiratory infection, unspecified: Secondary | ICD-10-CM | POA: Diagnosis not present

## 2021-03-23 NOTE — Patient Instructions (Signed)
It was great to see you! ? ?Our plans for today:  ?- See below for self-isolation guidelines. You may end your quarantine if your test is negative, or if positive, once you are 10 days from symptom onset and fever free for 24 hours without use of tylenol or ibuprofen. Wear a well-fitting N95 mask if you have to go out and about. ?- If your test is negative we will be prescribing an antiviral to treat COVID.  ?- I recommend getting vaccinated with your COVID booster once you are healed from your current infection. ?- Certainly, if you are having difficulties breathing or unable to keep down fluids, go to the Emergency Department.  ? ?Take care and seek immediate care sooner if you develop any concerns.  ? ?Dr. Ky Barban ? ?

## 2021-03-23 NOTE — Progress Notes (Signed)
Virtual Visit via Video Note ? ?I connected with Pamela Dodson on 03/23/21 at  3:40 PM EDT by a video enabled telemedicine application and verified that I am speaking with the correct person using two identifiers. ? ?Location: ?Patient: home ?Provider: [facility] ?  ?I discussed the limitations of evaluation and management by telemedicine and the availability of in person appointments. The patient expressed understanding and agreed to proceed. ? ?History of Present Illness: ? ?UPPER RESPIRATORY TRACT INFECTION ?- using inhaler and nebulizer previously received with last cold. No h/o underlying lung issues. ?- symptom onset last Thursday 3/16 ? ?Fever: yes, Tmax 103F ?Cough: yes ?Shortness of breath: no ?Wheezing: yes ?Chest pain: no ?Chest tightness: no ?Chest congestion: yes ?Nasal congestion: yes ?Sinus pressure: yes ?Face pain: yes ?Ear pain: no  ?Ear pressure: no  ?Vomiting: no ?Sick contacts: yes, husband with similar symptoms ?Treatments attempted:  mucinex, extra strength tylenol ?  ? ?Observations/Objective: ? ?Well appearing, in NAD. Speaks in full sentences, hoarse sounding. Comfortable WOB on RA. No resp distress.  ? ? ?Assessment and Plan: ? ?URI ?Mild-mod sx. Recommend testing for COVID, would be a candidate for antiviral if positive. Reviewed OTC symptom relief, recommend obtaining mucinex with decongestant. Reviewed self-quarantine guidelines and emergency precautions. ? ?  ?I discussed the assessment and treatment plan with the patient. The patient was provided an opportunity to ask questions and all were answered. The patient agreed with the plan and demonstrated an understanding of the instructions. ?  ?The patient was advised to call back or seek an in-person evaluation if the symptoms worsen or if the condition fails to improve as anticipated. ? ?I provided 8 minutes of non-face-to-face time during this encounter. ? ? ?Myles Gip, DO ?

## 2021-03-26 ENCOUNTER — Encounter: Payer: Self-pay | Admitting: Family Medicine

## 2021-03-27 ENCOUNTER — Ambulatory Visit: Payer: Self-pay

## 2021-03-27 ENCOUNTER — Telehealth (INDEPENDENT_AMBULATORY_CARE_PROVIDER_SITE_OTHER): Payer: Medicare Other | Admitting: Family Medicine

## 2021-03-27 DIAGNOSIS — J069 Acute upper respiratory infection, unspecified: Secondary | ICD-10-CM | POA: Diagnosis not present

## 2021-03-27 DIAGNOSIS — R062 Wheezing: Secondary | ICD-10-CM

## 2021-03-27 NOTE — Telephone Encounter (Signed)
?  Chief Complaint: Wheezing ?Symptoms: Chest wheezing ?Frequency: 1 week ?Pertinent Negatives: Patient denies SOB ?Disposition: '[]'$ ED /'[]'$ Urgent Care (no appt availability in office) / '[]'$ Appointment(In office/virtual)/ '[]'$  [facility]/ '[]'$ Home Care/ '[]'$ Refused Recommended Disposition /'[]'$ [facility]/ '[x]'$  Follow-up with PCP ?Additional Notes: Pt called for follow up on breathing from earlier this week. Pt states she is feeling better, but still has wheezing in her chest. She is unsure when this should be resolved, or if she needs additional medical intervention. Pt also wants to know how long she can continue with nebulizer and inhaler. Please return her call. ? ?

## 2021-03-27 NOTE — Telephone Encounter (Signed)
Pt has a virtual appt with Leisa for today ?

## 2021-03-27 NOTE — Telephone Encounter (Signed)
Reason for Disposition ? [1] MILD longstanding difficulty breathing AND [2]  SAME as normal ? ?Answer Assessment - Initial Assessment Questions ?1. RESPIRATORY STATUS: "Describe your breathing?" (e.g., wheezing, shortness of breath, unable to speak, severe coughing)  ?    Wheezing ?2. ONSET: "When did this breathing problem begin?"  ?    1 week ?3. PATTERN "Does the difficult breathing come and go, or has it been constant since it started?"  ?    constant ?4. SEVERITY: "How bad is your breathing?" (e.g., mild, moderate, severe)  ?  - MILD: No SOB at rest, mild SOB with walking, speaks normally in sentences, can lie down, no retractions, pulse < 100.  ?  - MODERATE: SOB at rest, SOB with minimal exertion and prefers to sit, cannot lie down flat, speaks in phrases, mild retractions, audible wheezing, pulse 100-120.  ?  - SEVERE: Very SOB at rest, speaks in single words, struggling to breathe, sitting hunched forward, retractions, pulse > 120  ?    mild ?5. RECURRENT SYMPTOM: "Have you had difficulty breathing before?" If Yes, ask: "When was the last time?" and "What happened that time?"  ?    A long time ago ?6. CARDIAC HISTORY: "Do you have any history of heart disease?" (e.g., heart attack, angina, bypass surgery, angioplasty)  ?    *No Answer* ?7. LUNG HISTORY: "Do you have any history of lung disease?"  (e.g., pulmonary embolus, asthma, emphysema) ?    *No Answer* ?8. CAUSE: "What do you think is causing the breathing problem?"  ?    *No Answer* ?9. OTHER SYMPTOMS: "Do you have any other symptoms? (e.g., dizziness, runny nose, cough, chest pain, fever) ?    *No Answer* ?10. O2 SATURATION MONITOR:  "Do you use an oxygen saturation monitor (pulse oximeter) at home?" If Yes, "What is your reading (oxygen level) today?" "What is your usual oxygen saturation reading?" (e.g., 95%) ?      *No Answer* ?11. PREGNANCY: "Is there any chance you are pregnant?" "When was your last menstrual period?" ?      *No Answer* ?12.  TRAVEL: "Have you traveled out of the country in the last month?" (e.g., travel history, exposures) ?      *No Answer* ? ?Protocols used: Breathing Difficulty-A-AH ? ?

## 2021-03-27 NOTE — Progress Notes (Signed)
? ?Name: Pamela Dodson   MRN: [id number]    DOB: November 27, 1950   Date:03/27/2021 ? ?     Progress Note ? ?Subjective:  ? ? ?Chief Complaint ? ?Chief Complaint  ?Patient presents with  ? Follow-up  ?  Pt states feels a lot better but still concerned for her wheezing  ? Cough  ?  Improved but still has a slight cough  ? Wheezing  ?  Feels it in her left lung even in her nose  ? ? ?I connected with  [name]  on 03/27/21 at  3:00 PM EDT by a video enabled telemedicine application and verified that I am speaking with the correct person using two identifiers.  I discussed the limitations of evaluation and management by telemedicine and the availability of in person appointments. The patient expressed understanding and agreed to proceed. Staff also discussed with the patient that there may be a patient responsible charge related to this service. ?Patient Location: home ?Provider Location: [facility]  ?Additional Individuals present:  ? ?HPI ?Cough and wheeze - recurrent since January  ?She recently got a cold again with increased cough - seen 3/20 for same ?  ?Inhaler 2x a day ICS ?Doing nebulizer was doing a couple times a day  ?Still hears wheeze and is coughing but no CP, chest tightness, SOB, fever chills sweats - wheezing is all new to her, no hx of asthma, copd, prolonged second hand smoke exposure ? ? ? ? ?Patient Active Problem List  ? Diagnosis Date Noted  ? History of total hip replacement, right 01/09/2020  ? SBO (small bowel obstruction) ([place]) 03/13/2019  ? History of resection of large bowel 03/13/2019  ? Small bowel obstruction ([place]) 03/13/2019  ? Aortic atherosclerosis ([place]) 03/13/2019  ? Coronary atherosclerosis 03/13/2019  ? Benign essential hypertension 10/10/2018  ? Chronic kidney disease, stage II (mild) 10/10/2018  ? 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 Obesity (BMI 30.0-34.9) 01/16/2018  ? Proteinuria 07/15/2017  ? Urine test positive for microalbuminuria 07/15/2017  ? Prediabetes 07/12/2016  ? Abnormal  mammogram of left breast 07/21/2015  ? Postsurgical intestinal bypass or anastomosis status   ? Hypertension goal BP (blood pressure) < 150/90 08/07/2014  ? Hot flash, menopausal 08/07/2014  ? Lumbar disc disease with radiculopathy 08/07/2014  ? Dysmetabolic syndrome 45/36/4680  ? Post-surgical hypothyroidism 08/07/2014  ? Generalized OA 08/07/2014  ? Hypercholesterolemia without hypertriglyceridemia 08/07/2014  ? Hernia of anterior abdominal wall 08/07/2014  ? Vestibular neuronitis 08/07/2014  ? Arthritis of hip 12/16/2010  ? ? ?Social History  ? ?Tobacco Use  ? Smoking status: Never  ? Smokeless tobacco: Never  ?Substance Use Topics  ? Alcohol use: Yes  ?  Alcohol/week: 1.0 standard drink  ?  Types: 1 Glasses of wine per week  ?  Comment: occasional  ? ? ? ?Current Outpatient Medications:  ?  acetaminophen (TYLENOL) 500 MG tablet, Take by mouth., Disp: , Rfl:  ?  amLODipine-benazepril (LOTREL) 10-40 MG capsule, Take 1 capsule by mouth at bedtime., Disp: 90 capsule, Rfl: 1 ?  Ascorbic Acid (VITAMIN C) 100 MG tablet, Take 100 mg by mouth daily., Disp: , Rfl:  ?  atorvastatin (LIPITOR) 80 MG tablet, TAKE 1 TABLET BY MOUTH  DAILY, Disp: 90 tablet, Rfl: 3 ?  benzonatate (TESSALON) 100 MG capsule, Take 1 capsule (100 mg total) by mouth 2 (two) times daily as needed for cough., Disp: 20 capsule, Rfl: 0 ?  Black Cohosh 160 MG CAPS, Take by mouth., Disp: ,  Rfl:  ?  Cholecalciferol 1000 UNITS capsule, Take 1,000 Units by mouth daily., Disp: , Rfl:  ?  Cholecalciferol 25 MCG (1000 UT) capsule, Take by mouth., Disp: , Rfl:  ?  Fluticasone Propionate, Inhal, (FLOVENT DISKUS) 50 MCG/ACT AEPB, Inhale 1 puff into the lungs daily., Disp: 1 each, Rfl: 1 ?  ipratropium-albuterol (DUONEB) 0.5-2.5 (3) MG/3ML SOLN, Take 3 mLs by nebulization every 2 (two) hours as needed., Disp: 360 mL, Rfl: 3 ?  levothyroxine (SYNTHROID) 175 MCG tablet, Take 1 tablet (175 mcg total) by mouth daily before breakfast., Disp: 90 tablet, Rfl: 1 ?   loratadine (CLARITIN) 10 MG tablet, Take 10 mg by mouth daily., Disp: , Rfl:  ?  LORazepam (ATIVAN) 1 MG tablet, Take 1 mg by mouth daily as needed., Disp: , Rfl:  ?  Multiple Vitamin (MULTIVITAMIN) tablet, Take 1 tablet by mouth daily., Disp: , Rfl:  ?  Omega-3 Fatty Acids (FISH OIL) 1000 MG CPDR, Take 1,000 mg by mouth 2 (two) times daily., Disp: , Rfl:  ?  ondansetron (ZOFRAN-ODT) 4 MG disintegrating tablet, Take 4 mg by mouth every 8 (eight) hours as needed., Disp: , Rfl:  ?  traMADol (ULTRAM) 50 MG tablet, Take by mouth., Disp: , Rfl:  ?  valACYclovir (VALTREX) 1000 MG tablet, TAKE 1 TABLET BY MOUTH  DAILY FOR 5 DAYS AT FIRST  SIGN OF COLD SORE. MAY TAKE 1 TABLET BY MOUTH DAILY FOR PREVENTION, Disp: 90 tablet, Rfl: 3 ?  venlafaxine XR (EFFEXOR-XR) 37.5 MG 24 hr capsule, Take 1 capsule (37.5 mg total) by mouth daily with breakfast., Disp: 90 capsule, Rfl: 1 ? ?Allergies  ?Allergen Reactions  ? Toradol [Ketorolac Tromethamine] Other (See Comments)  ?  Intolerance to NSAIDs due to CKD  ? ? ?I personally reviewed active problem list, medication list, allergies, family history, social history, health maintenance, notes from last encounter, lab results, imaging with the patient/caregiver today. ?Reviewed last 2 OV, [facility] CXR ? ?Review of Systems  ?Constitutional: Negative.  Negative for activity change, appetite change, chills, diaphoresis, fatigue and fever.  ?HENT: Negative.    ?Eyes: Negative.   ?Respiratory: Negative.  Negative for apnea, choking, chest tightness, shortness of breath and stridor.   ?Cardiovascular: Negative.  Negative for chest pain, palpitations and leg swelling.  ?Gastrointestinal: Negative.   ?Endocrine: Negative.   ?Genitourinary: Negative.   ?Musculoskeletal: Negative.   ?Skin: Negative.   ?Allergic/Immunologic: Negative.   ?Neurological: Negative.   ?Hematological: Negative.   ?Psychiatric/Behavioral: Negative.    ?All other systems reviewed and are negative.  ? ? ?Objective:  ? ?Virtual  encounter, vitals limited, only able to obtain the following ?There were no vitals filed for this visit. ?[name]. ?Nursing Note and Vital Signs reviewed. ? ?Physical Exam ?Vitals and nursing note reviewed.  ?Pulmonary:  ?   Effort: Pulmonary effort is normal. No respiratory distress.  ?   Comments: Normal WOB, no audible wheeze, no retractions, accessory muscle use ?Neurological:  ?   Mental Status: She is alert.  ? ? ?PE limited by virtual encounter ? ?No results found for this or any previous visit (from the past 72 hour(s)). ? ?Assessment and Plan:  ? ?  ICD-10-CM   ?1. Viral upper respiratory tract infection  J06.9   ?  ?2. Wheeze  R06.2   ?  ? ?Pt had visit earlier this week for URI/bronchitis/wheeze  ?She has restarted ICS - doing once daily and was  using neb tx several times a day and already decreasing ?She still hears wheeze and has mild cough but otherwise no fever, CP, sweats, SOB, chest tightness ? ?I encouraged pt to continue supportive Tx for her likely viral URI  ?Do ICS BID x 2 weeks and only use neb as needed, can continue OTC cough/cold/allergy meds ? ?Recent CAP/COVID - explained that some pt can have prolonged lung sx similar to reactive airway/COPD and if she continues to have wheeze/bronchitis with URI's we could either start a stronger daily combo maintenance inhaler for a few months or refer her to pulm for PFT's and further eval ? ?I reviewed her CXR which was reassuring, and clinically pt appears well and she is already improving and at this time oral/systemic steroids do not seem indicated. ? ?-Red flags and when to present for emergency care or RTC including fever >101.45F, chest pain, shortness of breath, new/worsening/un-resolving symptoms, reviewed with patient at time of visit. Follow up and care instructions discussed and provided in AVS. ?- I discussed the assessment and treatment plan with the patient. The patient was provided an  opportunity to ask questions and all were answered. The patient agreed with the plan and demonstrated an understanding of the instructions. ? ?I provided 20 minutes of non-face-to-face time during this encounter. ? ?[employer]

## 2021-03-31 ENCOUNTER — Encounter: Payer: Self-pay | Admitting: Family Medicine

## 2021-04-02 ENCOUNTER — Other Ambulatory Visit: Payer: Self-pay | Admitting: Family Medicine

## 2021-04-02 DIAGNOSIS — R232 Flushing: Secondary | ICD-10-CM

## 2021-04-02 NOTE — Telephone Encounter (Signed)
Requested medications are due for refill today.  yes ? ?Requested medications are on the active medications list.  yes ? ?Last refill. 10/07/2020 #90 1 refill ? ?Future visit scheduled.   no ? ?Notes to clinic.  Failed protocol due to expired labs. ? ? ? ?Requested Prescriptions  ?Pending Prescriptions Disp Refills  ? venlafaxine XR (EFFEXOR-XR) 37.5 MG 24 hr capsule [Pharmacy Med Name: Venlafaxine HCl ER 37.5 MG Oral Capsule Extended Release 24 Hour] 90 capsule 3  ?  Sig: TAKE 1 CAPSULE BY MOUTH  DAILY WITH BREAKFAST  ?  ? Psychiatry: Antidepressants - SNRI - desvenlafaxine & venlafaxine Failed - 04/02/2021  5:26 AM  ?  ?  Failed - Lipid Panel in normal range within the last 12 months  ?  Cholesterol, Total  ?Date Value Ref Range Status  ?08/14/2014 193 100 - 199 mg/dL Final  ? ?Cholesterol  ?Date Value Ref Range Status  ?12/11/2019 136 <200 mg/dL Final  ? ?LDL Cholesterol (Calc)  ?Date Value Ref Range Status  ?12/11/2019 68 mg/dL (calc) Final  ?  Comment:  ?  Reference range: <100 ?Marland Kitchen ?Desirable range <100 mg/dL for primary prevention;   ?<70 mg/dL for patients with CHD or diabetic patients  ?with > or = 2 CHD risk factors. ?. ?LDL-C is now calculated using the Martin-Hopkins  ?calculation, which is a validated novel method providing  ?better accuracy than the Friedewald equation in the  ?estimation of LDL-C.  ?Cresenciano Genre et al. Annamaria Helling. 0626;948(54): 2061-2068  ?(http://education.[employer].com/faq/FAQ164) ?  ? ?HDL  ?Date Value Ref Range Status  ?12/11/2019 47 (L) > OR = 50 mg/dL Final  ?08/14/2014 41 >39 mg/dL Final  ?  Comment:  ?  According to ATP-III Guidelines, HDL-C >59 mg/dL is considered a ?negative risk factor for CHD. ?  ? ?Triglycerides  ?Date Value Ref Range Status  ?12/11/2019 130 <150 mg/dL Final  ? ?  ?  ?  Passed - Cr in normal range and within 360 days  ?  Creat  ?Date Value Ref Range Status  ?10/07/2020 0.70 0.60 - 1.00 mg/dL Final  ? ?Creatinine, Urine  ?Date Value Ref Range Status  ?07/14/2017  163 20 - 275 mg/dL Final  ?  ?  ?  ?  Passed - Last BP in normal range  ?  BP Readings from Last 1 Encounters:  ?01/06/21 124/70  ?  ?  ?  ?  Passed - Valid encounter within last 6 months  ?  Recent Outpatient Visits   ? ?      ? 6 days ago Viral upper respiratory tract infection  ? [facility] [place], Kristeen Miss, [place]  ? 1 week ago Viral upper respiratory tract infection  ? [place], [place]  ? 2 months ago Acute cough  ? [place], DO  ? 5 months ago COVID-19  ? [facility] [place], Malachy Mood, NP  ? 5 months ago Hypertension goal BP (blood pressure) < 150/90  ? [place], DO  ? ?  ?  ? ?  ?  ?  ?  ?

## 2021-07-19 ENCOUNTER — Other Ambulatory Visit: Payer: Self-pay | Admitting: Family Medicine

## 2021-07-19 DIAGNOSIS — I10 Essential (primary) hypertension: Secondary | ICD-10-CM

## 2021-07-19 DIAGNOSIS — E890 Postprocedural hypothyroidism: Secondary | ICD-10-CM

## 2021-07-20 NOTE — Telephone Encounter (Signed)
Pt needs a f/u for further bp refills

## 2021-07-21 NOTE — Telephone Encounter (Signed)
Requested Prescriptions  Pending Prescriptions Disp Refills  . levothyroxine (SYNTHROID) 175 MCG tablet [Pharmacy Med Name: MYLAN-LEVOTHYROX 175MCG TABLET] 90 tablet 0    Sig: TAKE 1 TABLET BY MOUTH  DAILY BEFORE BREAKFAST     Endocrinology:  Hypothyroid Agents Passed - 07/19/2021  7:51 AM      Passed - TSH in normal range and within 360 days    TSH  Date Value Ref Range Status  10/07/2020 4.30 0.40 - 4.50 mIU/L Final         Passed - Valid encounter within last 12 months    Recent Outpatient Visits          3 months ago Viral upper respiratory tract infection   [facility] Delsa Grana, PA-C   4 months ago Viral upper respiratory tract infection   [place], DO   6 months ago Acute cough   [place], DO   9 months ago COVID-19   [facility] [place], [place], NP   9 months ago Hypertension goal BP (blood pressure) < 150/90   [facility] Myles Gip, DO      Future Appointments            In 2 weeks Delsa Grana, PA-C [facility], [facility]

## 2021-08-04 ENCOUNTER — Ambulatory Visit: Payer: Medicare Other | Admitting: Family Medicine

## 2021-08-04 ENCOUNTER — Ambulatory Visit (INDEPENDENT_AMBULATORY_CARE_PROVIDER_SITE_OTHER): Payer: Medicare Other | Admitting: Family Medicine

## 2021-08-04 ENCOUNTER — Encounter: Payer: Self-pay | Admitting: Family Medicine

## 2021-08-04 VITALS — BP 116/78 | HR 96 | Temp 98.2°F | Resp 16 | Ht 63.0 in | Wt 198.5 lb

## 2021-08-04 DIAGNOSIS — Z1231 Encounter for screening mammogram for malignant neoplasm of breast: Secondary | ICD-10-CM

## 2021-08-04 DIAGNOSIS — I700 Atherosclerosis of aorta: Secondary | ICD-10-CM

## 2021-08-04 DIAGNOSIS — I10 Essential (primary) hypertension: Secondary | ICD-10-CM | POA: Diagnosis not present

## 2021-08-04 DIAGNOSIS — E7800 Pure hypercholesterolemia, unspecified: Secondary | ICD-10-CM | POA: Diagnosis not present

## 2021-08-04 DIAGNOSIS — R7303 Prediabetes: Secondary | ICD-10-CM | POA: Diagnosis not present

## 2021-08-04 DIAGNOSIS — E890 Postprocedural hypothyroidism: Secondary | ICD-10-CM

## 2021-08-04 DIAGNOSIS — N182 Chronic kidney disease, stage 2 (mild): Secondary | ICD-10-CM

## 2021-08-04 DIAGNOSIS — R7989 Other specified abnormal findings of blood chemistry: Secondary | ICD-10-CM

## 2021-08-04 NOTE — Assessment & Plan Note (Signed)
Patient feels euthyroid denies any new or concerning symptoms, repeat TSH today and continue same dose if labs are within normal range

## 2021-08-04 NOTE — Progress Notes (Signed)
Name: Pamela Dodson   MRN: [id number]    DOB: 02-Aug-1950   Date:08/04/2021       Progress Note  Chief Complaint  Patient presents with   Follow-up   Hypertension   Hyperlipidemia   PreDM     Subjective:   Pamela Dodson is a 71 y.o. female, presents to clinic for routine follow up  Hypertension: states meds are managed by nephrology  Currently managed on amlodipine-benazepril 10-40 mg Pt reports good med compliance and denies any SE.   Blood pressure today is well controlled. BP Readings from Last 3 Encounters:  08/04/21 116/78  01/06/21 124/70  10/07/20 126/76   Pt denies CP, SOB, exertional sx, LE edema, palpitation, Ha's, visual disturbances, lightheadedness, hypotension, syncope. Dietary efforts for BP?  Heart healthy diet, avoids NSAIDs  Postsurgical hypothyroid she has been on the same dose for many years, taking 175 mcg daily, last TSH increase compared to prior labs but was still in normal range and patient continues to feel euthyroid -she denies any weight changes, dry skin, hair loss, constipation, mood changes, hot or cold intolerance, fatigue  Postmenopausal syndrome, palpitations and sweats patient is managed on Effexor 37.5 mg daily and this is doing well she would like to remain on it her mood is also good  Hyperlipidemia managed by atorvastatin 80 mg once daily she endorses good med compliance denies any side effects or concerns at this time, is due for repeat lipid  Prediabetes last A1c 5.8  We reviewed HM, cancer screenings and vaccines today -she is due next month for mammogram, DEXA is next year, shingles shot is due  She declines to do a Medicare well visit and states she is happy coming twice a year for her routine follow-up appointments and she also goes to nephrology and gets labs done there several times a year     Current Outpatient Medications:    acetaminophen (TYLENOL) 500 MG tablet, Take by mouth., Disp: , Rfl:    amLODipine-benazepril  (LOTREL) 10-40 MG capsule, TAKE 1 CAPSULE BY MOUTH AT  BEDTIME, Disp: 90 capsule, Rfl: 0   Ascorbic Acid (VITAMIN C) 100 MG tablet, Take 100 mg by mouth daily., Disp: , Rfl:    atorvastatin (LIPITOR) 80 MG tablet, TAKE 1 TABLET BY MOUTH  DAILY, Disp: 90 tablet, Rfl: 3   benzonatate (TESSALON) 100 MG capsule, Take 1 capsule (100 mg total) by mouth 2 (two) times daily as needed for cough., Disp: 20 capsule, Rfl: 0   Black Cohosh 160 MG CAPS, Take by mouth., Disp: , Rfl:    Cholecalciferol 1000 UNITS capsule, Take 1,000 Units by mouth daily., Disp: , Rfl:    Cholecalciferol 25 MCG (1000 UT) capsule, Take by mouth., Disp: , Rfl:    Fluticasone Propionate, Inhal, (FLOVENT DISKUS) 50 MCG/ACT AEPB, Inhale 1 puff into the lungs daily., Disp: 1 each, Rfl: 1   ipratropium-albuterol (DUONEB) 0.5-2.5 (3) MG/3ML SOLN, Take 3 mLs by nebulization every 2 (two) hours as needed., Disp: 360 mL, Rfl: 3   levothyroxine (SYNTHROID) 175 MCG tablet, TAKE 1 TABLET BY MOUTH  DAILY BEFORE BREAKFAST, Disp: 90 tablet, Rfl: 0   loratadine (CLARITIN) 10 MG tablet, Take 10 mg by mouth daily., Disp: , Rfl:    LORazepam (ATIVAN) 1 MG tablet, Take 1 mg by mouth daily as needed., Disp: , Rfl:    Multiple Vitamin (MULTIVITAMIN) tablet, Take 1 tablet by mouth daily., Disp: , Rfl:    Omega-3 Fatty Acids (FISH OIL) 1000  MG CPDR, Take 1,000 mg by mouth 2 (two) times daily., Disp: , Rfl:    ondansetron (ZOFRAN-ODT) 4 MG disintegrating tablet, Take 4 mg by mouth every 8 (eight) hours as needed., Disp: , Rfl:    valACYclovir (VALTREX) 1000 MG tablet, TAKE 1 TABLET BY MOUTH  DAILY FOR 5 DAYS AT FIRST  SIGN OF COLD SORE. MAY TAKE 1 TABLET BY MOUTH DAILY FOR PREVENTION, Disp: 90 tablet, Rfl: 3   venlafaxine XR (EFFEXOR-XR) 37.5 MG 24 hr capsule, TAKE 1 CAPSULE BY MOUTH  DAILY WITH BREAKFAST, Disp: 90 capsule, Rfl: 3   traMADol (ULTRAM) 50 MG tablet, Take by mouth. 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   Obesity (BMI 30.0-34.9) 01/16/2018   Proteinuria 07/15/2017   Urine test positive for microalbuminuria 07/15/2017   Prediabetes 07/12/2016   Abnormal mammogram of left breast 07/21/2015   Postsurgical intestinal bypass or anastomosis status    Hypertension goal BP (blood pressure) < 150/90 08/07/2014   Hot flash, menopausal 08/07/2014   Lumbar disc disease with radiculopathy 67/61/9509   Dysmetabolic syndrome 32/67/1245   Post-surgical hypothyroidism 08/07/2014   Generalized OA 08/07/2014   Hypercholesterolemia without hypertriglyceridemia 08/07/2014   Hernia of anterior abdominal wall 08/07/2014   Vestibular neuronitis 08/07/2014   Arthritis of hip 12/16/2010    Past Surgical History:  Procedure Laterality Date   APPENDECTOMY     CARPAL TUNNEL RELEASE Left 2016   CARPAL TUNNEL RELEASE Right 05/05/2019   CESAREAN SECTION     X2   CHOLECYSTECTOMY     COLON SURGERY     diverticulitic mass removed AND COLOSTOMY    COLONOSCOPY WITH PROPOFOL N/A 08/15/2014   Procedure: COLONOSCOPY WITH PROPOFOL;  Surgeon: Lucilla Lame, MD;  Location: [place];  Service: Endoscopy;  Laterality: N/A;   COLOSTOMY REVERSAL     GANGLION CYST EXCISION     L wrist   GLOMUS TUMOR EXCISION Left 12/12/2017   JOINT REPLACEMENT     knee arthroscopy     THYROIDECTOMY  2009   TOTAL HIP ARTHROPLASTY  12/16/2010   Procedure: TOTAL HIP ARTHROPLASTY;  Surgeon: Kerin Salen;  Location: [place];  Service: Orthopedics;  Laterality: Left;  Depuy   TOTAL HIP ARTHROPLASTY Right  01/09/2020   Procedure: TOTAL HIP ARTHROPLASTY ANTERIOR APPROACH;  Surgeon: Lovell Sheehan, MD;  Location: [facility];  Service: Orthopedics;  Laterality: Right;   TOTAL KNEE ARTHROPLASTY Bilateral 2009    Family History  Problem Relation Age of Onset   Cancer Mother        lung   Myelodysplastic syndrome Mother    Hypothyroidism Mother    Hypertension Brother    Stroke Brother    Atrial fibrillation Brother    Birth defects Son        congenital genetic abnormality   Dementia Maternal Grandfather    Heart disease Paternal Grandmother        heart disease in her 81's   Stroke Paternal Grandmother    Heart attack Paternal Grandfather    Heart disease  Paternal Aunt    Heart attack Paternal Aunt    Stroke Paternal Aunt    Atrial fibrillation Paternal Uncle     Social History   Tobacco Use   Smoking status: Never   Smokeless tobacco: Never  Vaping Use   Vaping Use: Never used  Substance Use Topics   Alcohol use: Yes    Alcohol/week: 1.0 standard drink of alcohol    Types: 1 Glasses of wine per week    Comment: occasional   Drug use: No     Allergies  Allergen Reactions   Toradol [Ketorolac Tromethamine] Other (See Comments)    Intolerance to NSAIDs due to CKD    Health Maintenance  Topic Date Due   Zoster Vaccines- Shingrix (1 of 2) Never done   COVID-19 Vaccine (5 - Pfizer series) 04/12/2019   INFLUENZA VACCINE  08/04/2021   MAMMOGRAM  09/16/2021   DEXA SCAN  10/15/2022   COLONOSCOPY (Pts 45-87yr Insurance coverage will need to be confirmed)  08/14/2024   Pneumonia Vaccine 71 Years old  Completed   Hepatitis C Screening  Completed   HPV VACCINES  Aged Out   TETANUS/TDAP  Discontinued    Chart Review Today: I personally reviewed active problem list, medication list, allergies, family history, social history, health maintenance, notes from last encounter, lab results, imaging with the patient/caregiver today.   Review of Systems  Constitutional: Negative.    HENT: Negative.    Eyes: Negative.   Respiratory: Negative.    Cardiovascular: Negative.   Gastrointestinal: Negative.   Endocrine: Negative.   Genitourinary: Negative.   Musculoskeletal: Negative.   Skin: Negative.   Allergic/Immunologic: Negative.   Neurological: Negative.   Hematological: Negative.   Psychiatric/Behavioral: Negative.    All other systems reviewed and are negative.    Objective:   Vitals:   08/04/21 1331  BP: 116/78  Pulse: 96  Resp: 16  Temp: 98.2 F (36.8 C)  TempSrc: Oral  SpO2: 97%  Weight: 198 lb 8 oz (90 kg)  Height: '5\' 3"'$  (1.6 m)    Body mass index is 35.16 kg/m.  Physical Exam Vitals and nursing note reviewed.  Constitutional:      General: She is not in acute distress.    Appearance: Normal appearance. She is well-developed. She is obese. She is not ill-appearing, toxic-appearing or diaphoretic.     Interventions: Face mask in place.  HENT:     Head: Normocephalic and atraumatic.     Right Ear: External ear normal.     Left Ear: External ear normal.  Eyes:     General: Lids are normal. No scleral icterus.       Right eye: No discharge.        Left eye: No discharge.     Conjunctiva/sclera: Conjunctivae normal.  Neck:     Trachea: Phonation normal. No tracheal deviation.  Cardiovascular:     Rate and Rhythm: Normal rate and regular rhythm.     Pulses: Normal pulses.          Radial pulses are 2+ on the right side and 2+ on the left side.       Posterior tibial pulses are 2+ on the right side and 2+ on the left side.     Heart sounds: Normal heart sounds. No murmur heard.    No friction rub. No gallop.  Pulmonary:     Effort: Pulmonary effort is normal. No respiratory distress.     Breath sounds: Normal breath sounds.  No stridor. No wheezing, rhonchi or rales.  Chest:     Chest wall: No tenderness.  Abdominal:     General: Bowel sounds are normal. There is no distension.     Palpations: Abdomen is soft.  Musculoskeletal:      Right lower leg: No edema.     Left lower leg: No edema.  Skin:    General: Skin is warm and dry.     Coloration: Skin is not jaundiced or pale.     Findings: No rash.  Neurological:     Mental Status: She is alert. Mental status is at baseline.     Motor: No abnormal muscle tone.     Gait: Gait normal.  Psychiatric:        Mood and Affect: Mood normal.        Speech: Speech normal.        Behavior: Behavior normal.        Thought Content: Thought content normal.         Assessment & Plan:   Problem List Items Addressed This Visit       Cardiovascular and Mediastinum   Hypertension goal BP (blood pressure) < 150/90 - Primary    Well controlled, compliant with meds, no SE or concerning sx Labs -  renal function and electrolytes -reviewed through care everywhere Pt encouraged to continue to work on healthy diet (low salt) and lifestyle for improving HTN management - DASH diet handout offered today       Aortic atherosclerosis (HCC)    On statin, monitoring        Endocrine   Post-surgical hypothyroidism    Patient feels euthyroid denies any new or concerning symptoms, repeat TSH today and continue same dose if labs are within normal range      Relevant Orders   TSH     Genitourinary   Chronic kidney disease, stage II (mild)    Monitored by nephrology Recent labs and office visit were reviewed today Good renal function, creatinine and BUN        Other   Hypercholesterolemia without hypertriglyceridemia    Lab Results  Component Value Date   CHOL 136 12/11/2019   HDL 47 (L) 12/11/2019   LDLCALC 68 12/11/2019   TRIG 130 12/11/2019   CHOLHDL 2.9 12/11/2019  LDL previously at goal, due for labs - lipid and hepatic panel Compliant with statin, no SE or concerns        Relevant Orders   Lipid panel   Hepatic function panel   Prediabetes    Recheck A1c      Relevant Orders   Hemoglobin A1c   Other Visit Diagnoses     Encounter for screening  mammogram for malignant neoplasm of breast       Relevant Orders   MM 3D SCREEN BREAST BILATERAL        Return in about 6 months (around 02/04/2022) for Routine follow-up.   Delsa Grana, PA-C 08/04/21 2:16 PM

## 2021-08-04 NOTE — Assessment & Plan Note (Signed)
Recheck A1c 

## 2021-08-04 NOTE — Patient Instructions (Addendum)
Health Maintenance  Topic Date Due   Zoster (Shingles) Vaccine (1 of 2) Never done   COVID-19 Vaccine (5 - Pfizer series) 04/12/2019   Flu Shot  08/04/2021   Mammogram  09/16/2021   DEXA scan (bone density measurement)  10/15/2022   Colon Cancer Screening  08/14/2024   Pneumonia Vaccine  Completed   Hepatitis C Screening: USPSTF Recommendation to screen - Ages 18-72 yo.  Completed   HPV Vaccine  Aged Out   Tetanus Vaccine  Discontinued   [facility] [address], [place], [place] [address] Scheduling phone #: [phone]

## 2021-08-04 NOTE — Assessment & Plan Note (Signed)
Lab Results  Component Value Date   CHOL 136 12/11/2019   HDL 47 (L) 12/11/2019   LDLCALC 68 12/11/2019   TRIG 130 12/11/2019   CHOLHDL 2.9 12/11/2019  LDL previously at goal, due for labs - lipid and hepatic panel Compliant with statin, no SE or concerns

## 2021-08-04 NOTE — Assessment & Plan Note (Signed)
On statin, monitoring 

## 2021-08-04 NOTE — Assessment & Plan Note (Signed)
Monitored by nephrology Recent labs and office visit were reviewed today Good renal function, creatinine and BUN

## 2021-08-04 NOTE — Assessment & Plan Note (Signed)
Well controlled, compliant with meds, no SE or concerning sx Labs -  renal function and electrolytes -reviewed through care everywhere Pt encouraged to continue to work on healthy diet (low salt) and lifestyle for improving HTN management - DASH diet handout offered today

## 2021-08-06 LAB — TSH: TSH: 9.31 mIU/L — ABNORMAL HIGH (ref 0.40–4.50)

## 2021-08-06 LAB — LIPID PANEL
Cholesterol: 171 mg/dL (ref <200)
HDL: 45 mg/dL — ABNORMAL LOW (ref >=50)
LDL Cholesterol (Calc): 98 mg/dL (calc)
Non-HDL Cholesterol (Calc): 126 mg/dL (calc) (ref <130)
Total CHOL/HDL Ratio: 3.8 (calc) (ref <5.0)
Triglycerides: 186 mg/dL — ABNORMAL HIGH (ref <150)

## 2021-08-06 LAB — HEMOGLOBIN A1C
Hgb A1c MFr Bld: 5.6 % of total Hgb (ref <5.7)
Mean Plasma Glucose: 114 mg/dL
eAG (mmol/L): 6.3 mmol/L

## 2021-08-06 LAB — HEPATIC FUNCTION PANEL
AG Ratio: 1.4 (calc) (ref 1.0–2.5)
ALT: 24 U/L (ref 6–29)
AST: 24 U/L (ref 10–35)
Albumin: 4.3 g/dL (ref 3.6–5.1)
Alkaline phosphatase (APISO): 80 U/L (ref 37–153)
Bilirubin, Direct: 0.1 mg/dL (ref 0.0–0.2)
Globulin: 3 g/dL (calc) (ref 1.9–3.7)
Indirect Bilirubin: 0.4 mg/dL (calc) (ref 0.2–1.2)
Total Bilirubin: 0.5 mg/dL (ref 0.2–1.2)
Total Protein: 7.3 g/dL (ref 6.1–8.1)

## 2021-08-13 NOTE — Addendum Note (Signed)
Addended by: Delsa Grana on: 08/13/2021 02:49 PM   Modules accepted: Orders

## 2021-08-18 LAB — THYROID PANEL WITH TSH
Free Thyroxine Index: 2.6 (ref 1.4–3.8)
T3 Uptake: 33 % (ref 22–35)
T4, Total: 8 ug/dL (ref 5.1–11.9)
TSH: 6.98 mIU/L — ABNORMAL HIGH (ref 0.40–4.50)

## 2021-08-19 NOTE — Progress Notes (Signed)
Lvm for pt to return call to schedule virtual visit to discuss test results

## 2021-08-20 ENCOUNTER — Telehealth: Payer: Self-pay | Admitting: Family Medicine

## 2021-08-20 DIAGNOSIS — E890 Postprocedural hypothyroidism: Secondary | ICD-10-CM

## 2021-08-20 DIAGNOSIS — R7989 Other specified abnormal findings of blood chemistry: Secondary | ICD-10-CM

## 2021-08-20 NOTE — Telephone Encounter (Signed)
Pt's TSH has been abnormally elevated, on 175 mcg daily, she feels euthyroid Discussed administration of meds - taking in am, on empty stomach only with antihistamine, she takes supplements and several other meds in am and pm, what is new is addition of calcium supplement and another vitamin/supplement with iron in it  She will move supplements back to lunch time, continue same levothyroxine dose in am, recheck labs in 6-8 weeks  Lab Results  Component Value Date   TSH 6.98 (H) 08/17/2021   TSH 9.31 (H) 08/05/2021   TSH 4.30 10/07/2020   TSH 1.94 12/11/2019   TSH 0.71 03/09/2019   Weight stable - energy good, no brain fog, change in skin hair bowels Wt Readings from Last 5 Encounters:  08/04/21 198 lb 8 oz (90 kg)  01/06/21 204 lb (92.5 kg)  10/23/20 202 lb (91.6 kg)  10/07/20 202 lb 4.8 oz (91.8 kg)  04/04/20 198 lb (89.8 kg)   BMI Readings from Last 5 Encounters:  08/04/21 35.16 kg/m  01/06/21 36.14 kg/m  10/23/20 35.78 kg/m  10/07/20 35.84 kg/m  04/04/20 35.07 kg/m     ICD-10-CM   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 Elevated TSH  R79.89 TSH   recheck labs again in 6-8 weeks, adjusting supplements - avoid in am with thyroid, more than 4 hours apart, no dose change, recheck labs    Labs in 6-8 weeks   Delsa Grana, PA-C

## 2021-08-21 ENCOUNTER — Telehealth: Payer: Medicare Other | Admitting: Family Medicine

## 2021-08-26 ENCOUNTER — Other Ambulatory Visit: Payer: Self-pay | Admitting: Family Medicine

## 2021-08-26 DIAGNOSIS — E890 Postprocedural hypothyroidism: Secondary | ICD-10-CM

## 2021-08-26 NOTE — Telephone Encounter (Signed)
Medication Refill - Medication: levothyroxine (SYNTHROID) 175 MCG tablet   Has the patient contacted their pharmacy? Yes.   (Agent: If no, request that the patient contact the pharmacy for the refill. If patient does not wish to contact the pharmacy document the reason why and proceed with request.) (Agent: If yes, when and what did the pharmacy advise?)  Preferred Pharmacy (with phone number or street name):  [facility] [place], [place] [facility]  [address] [place] [address]  Phone: [phone] Fax: [phone]   Has the patient been seen for an appointment in the last year OR does the patient have an upcoming appointment? Yes.    Agent: Please be advised that RX refills may take up to 3 business days. We ask that you follow-up with your pharmacy.

## 2021-08-27 MED ORDER — LEVOTHYROXINE SODIUM 175 MCG PO TABS: 175.0000 ug | ORAL_TABLET | Freq: Every day | ORAL | 0 refills | Status: DC

## 2021-08-27 NOTE — Telephone Encounter (Signed)
Requested medication (s) are due for refill today: no  Requested medication (s) are on the active medication list: yes  Last refill:  07/21/21  Future visit scheduled:yes  Notes to clinic:  Unable to refill per protocol, last refill by provider 07/21/21 for 90 days, refill too soon.     Requested Prescriptions  Pending Prescriptions Disp Refills   levothyroxine (SYNTHROID) 175 MCG tablet 90 tablet 0    Sig: Take 1 tablet (175 mcg total) by mouth daily before breakfast.     Endocrinology:  Hypothyroid Agents Failed - 08/26/2021  5:48 PM      Failed - TSH in normal range and within 360 days    TSH  Date Value Ref Range Status  08/17/2021 6.98 (H) 0.40 - 4.50 mIU/L Final         Passed - Valid encounter within last 12 months    Recent Outpatient Visits           3 weeks ago Hypertension goal BP (blood pressure) < 150/90   [facility] [place], Kristeen Miss, PA-C   5 months ago Viral upper respiratory tract infection   [facility] Delsa Grana, PA-C   5 months ago Viral upper respiratory tract infection   [place], DO   7 months ago Acute cough   [facility] Teodora Medici, DO   10 months ago COVID-19   [place], NP       Future Appointments             In 5 months Delsa Grana, PA-C [facility], [facility]

## 2021-09-21 ENCOUNTER — Other Ambulatory Visit: Payer: Self-pay | Admitting: Family Medicine

## 2021-09-21 DIAGNOSIS — I10 Essential (primary) hypertension: Secondary | ICD-10-CM

## 2021-10-06 ENCOUNTER — Other Ambulatory Visit: Payer: Self-pay | Admitting: Family Medicine

## 2021-10-06 DIAGNOSIS — E890 Postprocedural hypothyroidism: Secondary | ICD-10-CM

## 2021-10-06 LAB — TSH: TSH: 1.91 mIU/L (ref 0.40–4.50)

## 2021-10-06 MED ORDER — LEVOTHYROXINE SODIUM 175 MCG PO TABS: 175.0000 ug | ORAL_TABLET | Freq: Every day | ORAL | 3 refills | Status: DC

## 2021-10-07 ENCOUNTER — Encounter: Payer: Self-pay | Admitting: Family Medicine

## 2021-10-22 LAB — HM MAMMOGRAPHY

## 2021-12-17 IMAGING — CT CT ABD-PELV W/ CM
2 of 5 series · 16 of 46 positions shown, 18 images · IV contrast (omnipaque)
Comparison: 05/02/2006

CLINICAL DATA: Diffuse abdominal pain, worse on the left

EXAM:
CT ABDOMEN AND PELVIS WITH CONTRAST
TECHNIQUE: Multidetector CT imaging of the abdomen and pelvis was performed
using the standard protocol following bolus administration of
intravenous contrast.
CONTRAST:  100mL OMNIPAQUE IOHEXOL 300 MG/ML  SOLN

[Series 2: routine abd/pel with · axial · 0.88mm/px · z∈[-866,-456]mm · 13 of 92 slices shown, 15 images]
[im 5/92  soft-tissue]
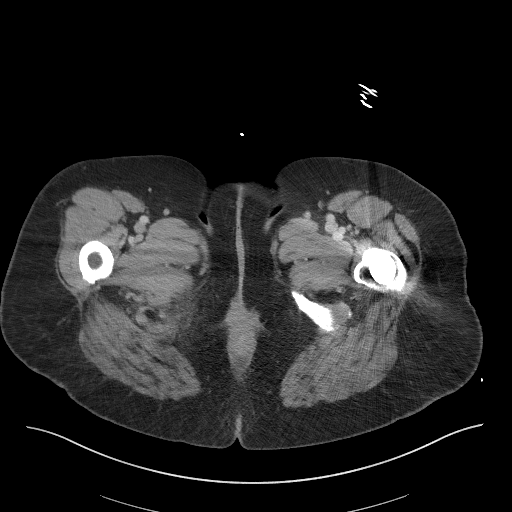
[im 5/92  bone]
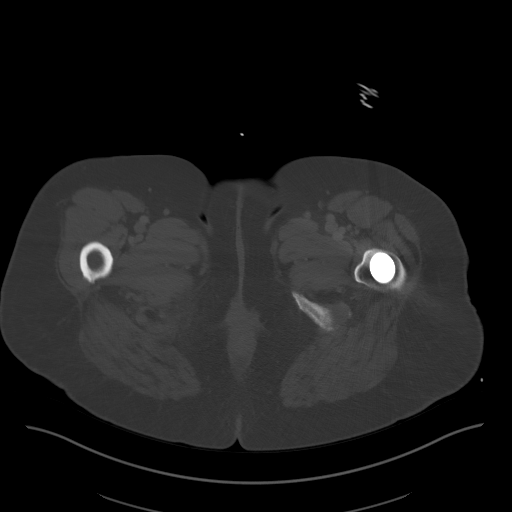
[im 15/92  soft-tissue]
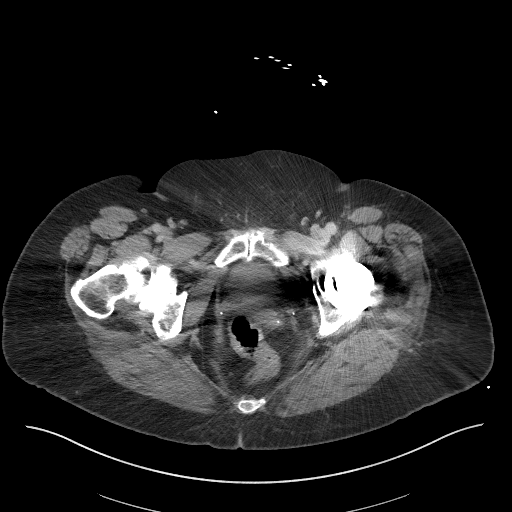
[im 20/92  soft-tissue]
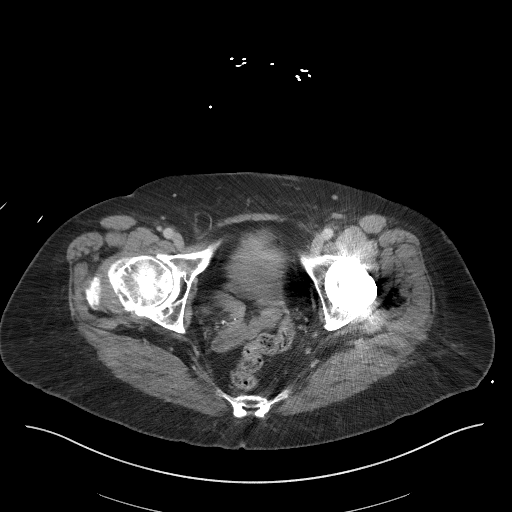
[im 24/92  soft-tissue]
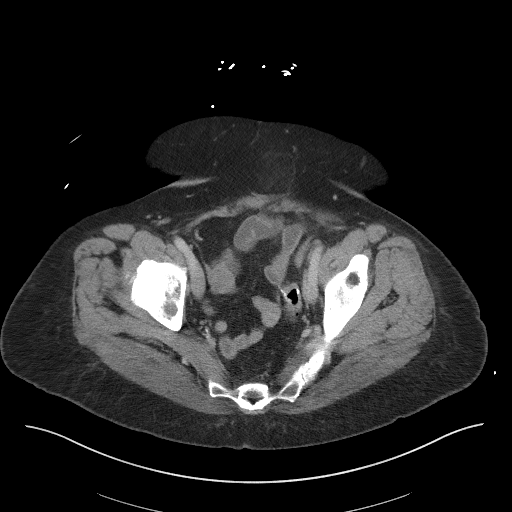
[im 34/92  soft-tissue]
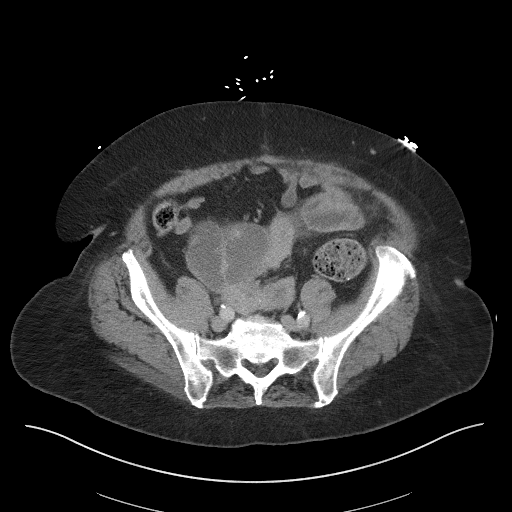
[im 39/92  soft-tissue]
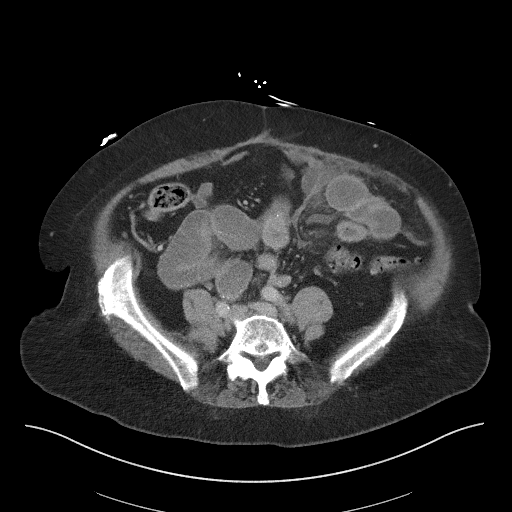
[im 48/92  soft-tissue]
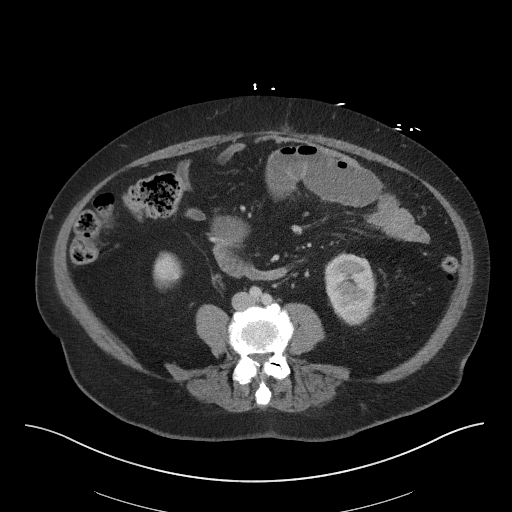
[im 53/92  soft-tissue]
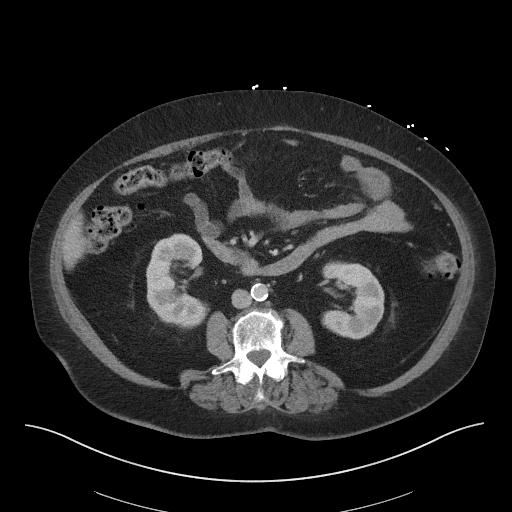
[im 58/92  soft-tissue]
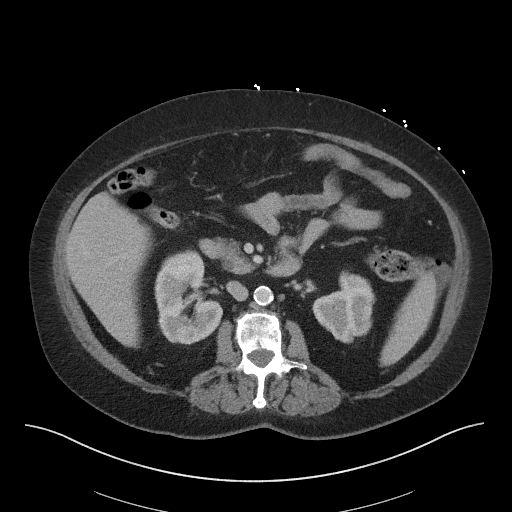
[im 58/92  bone]
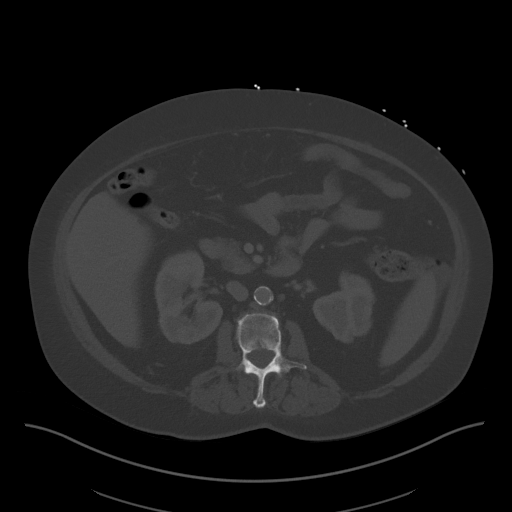
[im 68/92  soft-tissue]
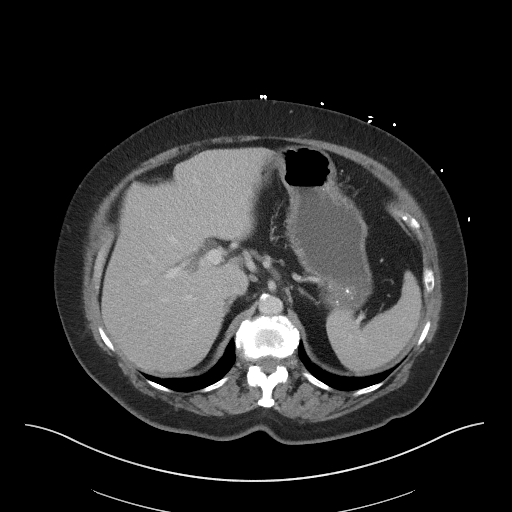
[im 72/92  soft-tissue]
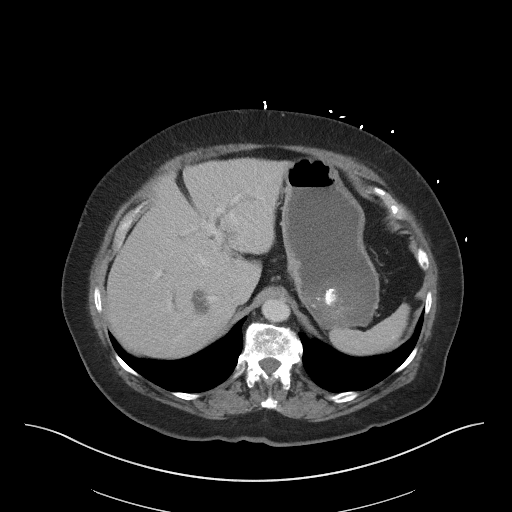
[im 77/92  soft-tissue]
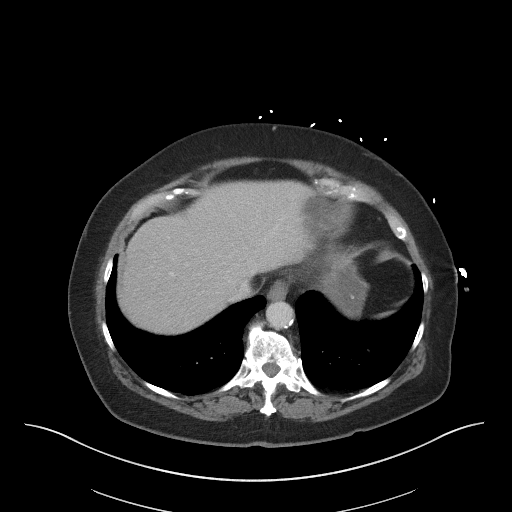
[im 87/92  soft-tissue]
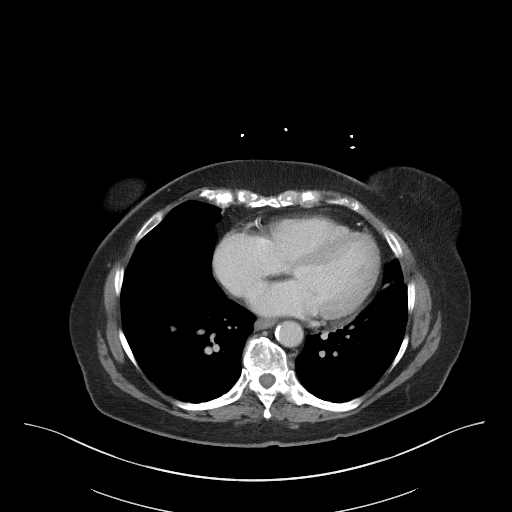

[Series 5: coronal st · coronal · 0.81mm/px · 3 of 99 slices shown]
[im 33/99  soft-tissue]
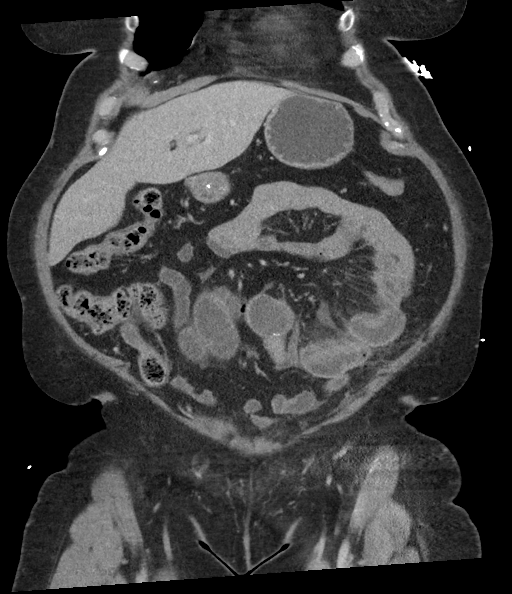
[im 44/99  soft-tissue]
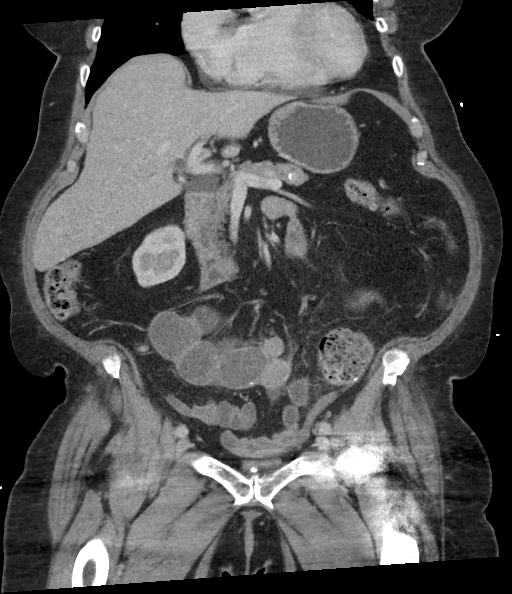
[im 55/99  soft-tissue]
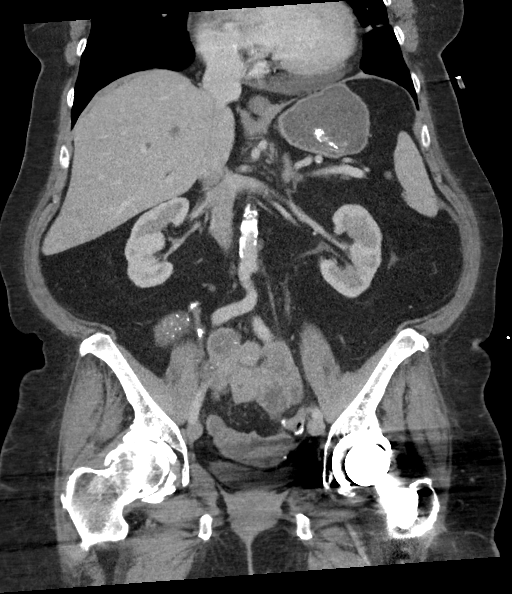

[16 of 46 positions shown; findings below may reference images not displayed]

FINDINGS: Lower chest:  Coronary atherosclerosis.  Trace pericardial fluid.

Hepatobiliary: Bilobed but simple appearing cystic density in the
right liver measuring up to 25 mm. Cholecystectomy which accounts
for mild intra and extrahepatic bile duct dilatation. No calcified
choledocholithiasis.

Pancreas: Unremarkable.

Spleen: Unremarkable.

Adrenals/Urinary Tract: Negative adrenals. No hydronephrosis or
stone. Tiny cystic density from the lower pole right kidney.
Unremarkable bladder.

Stomach/Bowel: Sigmoidectomy with continuity surgery since prior.
There are distorted and dilated loops of small bowel in the central
abdomen with a tethered appearance at the level of the central
abdomen and low pelvis, attributed to adhesive disease. Some
interloop fluid and mesenteric congestion is noted. There is a
chronic small bowel containing midline incisional hernia that is not
at a transition point. The colon is relatively decompressed.

Vascular/Lymphatic: No acute vascular abnormality. Atherosclerotic
calcifications. No mass or adenopathy.

Reproductive:No worrisome finding

Other: No pneumoperitoneum. Minimal reactive appearing interloop
fluid.

Musculoskeletal: Left hip arthroplasty and severe right hip
osteoarthritis. Generalized lumbar spine degeneration with disc
narrowing and spurring. There is mild scoliosis. No acute osseous
finding.
IMPRESSION: 1. Small bowel obstruction with transition point at the level of
tethered bowel loops closely neighboring prior sigmoidectomy and
colostomy site, likely adhesive disease.
2.  Aortic Atherosclerosis (VIL81-PN7.7).  Coronary atherosclerosis.

## 2022-02-04 ENCOUNTER — Ambulatory Visit: Payer: Medicare Other | Admitting: Family Medicine

## 2022-02-07 ENCOUNTER — Other Ambulatory Visit: Payer: Self-pay | Admitting: Internal Medicine

## 2022-02-07 DIAGNOSIS — E7800 Pure hypercholesterolemia, unspecified: Secondary | ICD-10-CM

## 2022-02-09 NOTE — Telephone Encounter (Signed)
Unable to refill per protocol, Rx request is too soon. Last refill 03/20/21 for 90 and 3 refills.  Requested Prescriptions  Pending Prescriptions Disp Refills   atorvastatin (LIPITOR) 80 MG tablet [Pharmacy Med Name: Atorvastatin Calcium 80 MG Oral Tablet] 90 tablet 3    Sig: TAKE 1 TABLET BY MOUTH ONCE  DAILY     Cardiovascular:  Antilipid - Statins Failed - 02/07/2022 11:26 PM      Failed - Lipid Panel in normal range within the last 12 months    Cholesterol, Total  Date Value Ref Range Status  08/14/2014 193 100 - 199 mg/dL Final   Cholesterol  Date Value Ref Range Status  08/05/2021 171 <200 mg/dL Final   LDL Cholesterol (Calc)  Date Value Ref Range Status  08/05/2021 98 mg/dL (calc) Final    Comment:    Reference range: <100 . Desirable range <100 mg/dL for primary prevention;   <70 mg/dL for patients with CHD or diabetic patients  with > or = 2 CHD risk factors. Marland Kitchen LDL-C is now calculated using the Martin-Hopkins  calculation, which is a validated novel method providing  better accuracy than the Friedewald equation in the  estimation of LDL-C.  Cresenciano Genre et al. Annamaria Helling. 6222;979(89): 2061-2068  (http://education.[employer].com/faq/FAQ164)    HDL  Date Value Ref Range Status  08/05/2021 45 (L) > OR = 50 mg/dL Final  08/14/2014 41 >39 mg/dL Final    Comment:    According to ATP-III Guidelines, HDL-C >59 mg/dL is considered a negative risk factor for CHD.    Triglycerides  Date Value Ref Range Status  08/05/2021 186 (H) <150 mg/dL Final         Passed - Patient is not pregnant      Passed - Valid encounter within last 12 months    Recent Outpatient Visits           6 months ago Hypertension goal BP (blood pressure) < 150/90   [facility] [place], Kristeen Miss, PA-C   10 months ago Viral upper respiratory tract infection   [facility] Delsa Grana, PA-C   10 months ago Viral upper respiratory tract infection    [place], DO   1 year ago Acute cough   [facility] Teodora Medici, DO   1 year ago [facility] Kathrine Haddock, NP       Future Appointments             In 1 week Delsa Grana, [facility], [facility]

## 2022-02-16 NOTE — Progress Notes (Unsigned)
Subjective:   Pamela Dodson is a 72 y.o. female who presents for Medicare Annual (Subsequent) preventive examination.  Review of Systems:  Review of Systems  Constitutional: Negative.   HENT: Negative.    Eyes: Negative.   Respiratory: Negative.    Cardiovascular: Negative.   Gastrointestinal: Negative.   Endocrine: Negative.   Genitourinary: Negative.   Musculoskeletal: Negative.   Skin: Negative.   Allergic/Immunologic: Negative.   Neurological: Negative.   Hematological: Negative.   Psychiatric/Behavioral: Negative.    All other systems reviewed and are negative.   Cardiac Risk Factors include: dyslipidemia;hypertension;advanced age (>69mn, >>68women);obesity (BMI >30kg/m2)     Objective:     Vitals: BP 112/78   Pulse 98   Temp 97.8 F (36.6 C) (Oral)   Resp 16   Ht 5' 3"$  (1.6 m)   Wt 195 lb 6.4 oz (88.6 kg)   SpO2 94%   BMI 34.61 kg/m   Body mass index is 34.61 kg/m.     02/17/2022    2:28 PM 01/09/2020   12:32 PM 01/09/2020    7:18 AM 01/02/2020    8:41 AM 03/13/2019    5:05 PM 03/13/2019    1:40 AM 11/10/2018    9:41 AM  Advanced Directives  Does Patient Have a Medical Advance Directive? No No No No No No No  Would patient like information on creating a medical advance directive? Yes (MAU/Ambulatory/Procedural Areas - Information given) No - Patient declined No - Patient declined No - Patient declined No - Patient declined  No - Patient declined    Tobacco Social History   Tobacco Use  Smoking Status Never  Smokeless Tobacco Never     Counseling given: Not Answered   Clinical Intake:  Pre-visit preparation completed: No  Pain : No/denies pain     BMI - recorded: 34.61 Nutritional Risks: None Diabetes: No  How often do you need to have someone help you when you read instructions, pamphlets, or other written materials from your doctor or pharmacy?: 1 - Never     Information entered by :: Lt  Past Medical History:  Diagnosis Date    Arthritis    Blood transfusion    [place]   [place]    hx thyroid cancer   Chronic kidney disease    PROTEIN LEAK    Full code status 11/02/2016   Gastro-esophageal reflux disease without esophagitis 08/07/2014   GERD (gastroesophageal reflux disease)    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  H/O malignant neoplasm of thyroid 08/07/2014   S/P resection x2    History of knee replacement 08/07/2014   Hypertension    Hypothyroidism    Neuromuscular disorder ([place]    numbnes lower legs s/p knee replacements   PONV (postoperative nausea and vomiting)    usually needs zofran/antiemetic prior to surgery   SBO (small bowel obstruction) ([place] 03/13/2019   Shingles rash 01/22/2015   [facility] 03/13/2019   Thyroid cancer ([place]    Wears contact lenses    Past Surgical History:  Procedure Laterality Date   APPENDECTOMY     CARPAL TUNNEL RELEASE Left 2016   CARPAL TUNNEL RELEASE Right 05/05/2019   CESAREAN SECTION     X2   CHOLECYSTECTOMY     COLON SURGERY     diverticulitic mass removed AND COLOSTOMY    COLONOSCOPY WITH PROPOFOL N/A 08/15/2014   Procedure: COLONOSCOPY WITH PROPOFOL;  Surgeon:  Lucilla Lame, MD;  Location: [facility];  Service: Endoscopy;  Laterality: N/A;   COLOSTOMY REVERSAL     GANGLION CYST EXCISION     L wrist   GLOMUS TUMOR EXCISION Left 12/12/2017   JOINT REPLACEMENT     knee arthroscopy     THYROIDECTOMY  2009   TOTAL HIP ARTHROPLASTY  12/16/2010   Procedure: TOTAL HIP ARTHROPLASTY;  Surgeon: Kerin Salen;  Location: [place];  Service: Orthopedics;  Laterality: Left;  [place]   TOTAL HIP ARTHROPLASTY Right 01/09/2020   Procedure: TOTAL HIP ARTHROPLASTY ANTERIOR APPROACH;  Surgeon: Lovell Sheehan, MD;  Location: [facility];  Service: Orthopedics;  Laterality: Right;   TOTAL KNEE ARTHROPLASTY Bilateral 2009   Family History  Problem Relation Age of Onset   Cancer Mother        lung    Myelodysplastic syndrome Mother    Hypothyroidism Mother    Hypertension Brother    Stroke Brother    Atrial fibrillation Brother    Birth defects Son        congenital genetic abnormality   Dementia Maternal Grandfather    Heart disease Paternal Grandmother        heart disease in her 72's   Stroke Paternal Grandmother    Heart attack Paternal Grandfather    Heart disease Paternal Aunt    Heart attack Paternal Aunt    Stroke Paternal Aunt    Atrial fibrillation Paternal Uncle    Social History   Socioeconomic History   Marital status: Married    Spouse name: james   Number of children: 2   Years of education: Not on file   Highest education level: Not on file  Occupational History   Not on file  Tobacco Use   Smoking status: Never   Smokeless tobacco: Never  Vaping Use   Vaping Use: Never used  Substance and Sexual Activity   Alcohol use: Yes    Alcohol/week: 1.0 standard drink of alcohol    Types: 1 Glasses of wine per week    Comment: occasional   Drug use: No   Sexual activity: Yes    Partners: Male  Other Topics Concern   Not on file  Social History Narrative   Not on file   Social Determinants of Health   Financial Resource Strain: Low Risk  (11/10/2018)   Overall Financial Resource Strain (CARDIA)    Difficulty of Paying Living Expenses: Not hard at all  Food Insecurity: No Food Insecurity (11/10/2018)   Hunger Vital Sign    Worried About Running Out of Food in the Last Year: Never true    Ran Out of Food in the Last Year: Never true  Transportation Needs: No Transportation Needs (11/10/2018)   PRAPARE - Hydrologist (Medical): No    Lack of Transportation (Non-Medical): No  Physical Activity: Inactive (11/10/2018)   Exercise Vital Sign    Days of Exercise per Week: 0 days    Minutes of Exercise per Session: 0 min  Stress: No Stress Concern Present (11/10/2018)   [place]    Feeling of Stress : Not at all  Social Connections: Unknown (11/10/2018)   Social Connection and Isolation Panel [NHANES]    Frequency of Communication with Friends and Family: Patient refused    Frequency of Social Gatherings with Friends and Family: Patient refused    Attends Religious Services: Patient refused  Active Member of Clubs or Organizations: Patient refused    Attends Archivist Meetings: Patient refused    Marital Status: Married    Outpatient Encounter Medications as of 02/17/2022  Medication Sig   acetaminophen (TYLENOL) 500 MG tablet Take by mouth.   amLODipine-benazepril (LOTREL) 10-40 MG capsule TAKE 1 CAPSULE BY MOUTH AT  BEDTIME   Ascorbic Acid (VITAMIN C) 100 MG tablet Take 100 mg by mouth daily.   Cholecalciferol 1000 UNITS capsule Take 1,000 Units by mouth daily.   Cholecalciferol 25 MCG (1000 UT) capsule Take by mouth.   ipratropium-albuterol (DUONEB) 0.5-2.5 (3) MG/3ML SOLN Take 3 mLs by nebulization every 2 (two) hours as needed.   levothyroxine (SYNTHROID) 175 MCG tablet Take 1 tablet (175 mcg total) by mouth daily before breakfast.   loratadine (CLARITIN) 10 MG tablet Take 10 mg by mouth daily.   Multiple Vitamin (MULTIVITAMIN) tablet Take 1 tablet by mouth daily.   Omega-3 Fatty Acids (FISH OIL) 1000 MG CPDR Take 1,000 mg by mouth 2 (two) times daily.   [DISCONTINUED] atorvastatin (LIPITOR) 80 MG tablet TAKE 1 TABLET BY MOUTH  DAILY   [DISCONTINUED] valACYclovir (VALTREX) 1000 MG tablet TAKE 1 TABLET BY MOUTH  DAILY FOR 5 DAYS AT FIRST  SIGN OF COLD SORE. MAY TAKE 1 TABLET BY MOUTH DAILY FOR PREVENTION   [DISCONTINUED] venlafaxine XR (EFFEXOR-XR) 37.5 MG 24 hr capsule TAKE 1 CAPSULE BY MOUTH  DAILY WITH BREAKFAST   atorvastatin (LIPITOR) 80 MG tablet Take 1 tablet (80 mg total) by mouth daily.   Black Cohosh 160 MG CAPS Take by mouth.   LORazepam (ATIVAN) 1 MG tablet Take 1 mg by mouth daily as needed.   ondansetron (ZOFRAN-ODT) 4  MG disintegrating tablet Take 4 mg by mouth every 8 (eight) hours as needed.   valACYclovir (VALTREX) 1000 MG tablet TAKE 1 TABLET BY MOUTH  DAILY FOR 5 DAYS AT FIRST  SIGN OF COLD SORE. MAY TAKE 1 TABLET BY MOUTH DAILY FOR PREVENTION   venlafaxine XR (EFFEXOR-XR) 37.5 MG 24 hr capsule Take 1 capsule (37.5 mg total) by mouth daily with breakfast.   No facility-administered encounter medications on file as of 02/17/2022.    Activities of Daily Living    02/17/2022    2:26 PM 02/17/2022    1:59 PM  In your present state of health, do you have any difficulty performing the following activities:  Hearing? 0 0  Vision? 0 0  Difficulty concentrating or making decisions? 0 0  Walking or climbing stairs? 0 0  Dressing or bathing? 0 0  Doing errands, shopping?  0  Preparing Food and eating ? N   Using the Toilet? N   In the past six months, have you accidently leaked urine? N   Do you have problems with loss of bowel control? N   Managing your Medications? N   Managing your Finances? N   Housekeeping or managing your Housekeeping? N     Patient Care Team: Delsa Grana, PA-C as PCP - General (Family Medicine) Frederik Pear, MD as Consulting Physician (Orthopedic Surgery) Arelia Sneddon, [place] (Optometry) Lucilla Lame, MD as Consulting Physician (Gastroenterology) Anthonette Legato, MD as Consulting Physician (Nephrology)    Assessment:   This is a routine wellness examination for Brizeida.  Exercise Activities and Dietary recommendations Current Exercise Habits: Home exercise routine   Goals      Increase physical activity     Weight (lb) < 175 lb (79.4 kg)  Pt states she would like to lose weight over the next year with healthy eating and physical activity        Fall Risk    02/17/2022    1:59 PM 08/04/2021    1:27 PM 03/27/2021    1:47 PM 03/23/2021    3:07 PM 01/06/2021   11:21 AM  [place] in the past year? 0 0 0 0 0  Number falls in past yr: 0 0 0 0 0  Injury  with Fall? 0 0 0 0 0  Risk for fall due to : No Fall Risks No Fall Risks No Fall Risks  No Fall Risks  Follow up Falls prevention discussed;Education provided;Falls evaluation completed Falls prevention discussed;Education provided Falls prevention discussed  Falls prevention discussed    Timed Get Up and Go performed: normal steady gait, GU&G time <12s - normal  Depression Screen    02/17/2022    1:59 PM 08/04/2021    1:27 PM 03/27/2021    1:47 PM 03/23/2021    3:07 PM  PHQ 2/9 Scores  PHQ - 2 Score 0 0 0 0  PHQ- 9 Score 0 0 0 0     Cognitive Function  Alert? Yes  Normal Appearance?Yes  Oriented to person? Yes  Place? Yes  Time? Yes  Can perform simple calculations? Yes  Displays appropriate judgment?Yes   Prior testing:        11/02/2016   11:59 AM  6CIT Screen  What Year? 0 points  What month? 0 points  What time? 0 points  Count back from 20 0 points  Months in reverse 0 points  Repeat phrase 0 points  Total Score 0 points    Immunization History  Administered Date(s) Administered   Influenza, High Dose Seasonal PF 09/28/2018   Influenza, Seasonal, Injecte, Preservative Fre 11/23/2010   Influenza-Unspecified 09/14/2016, 09/27/2017, 09/12/2019   PFIZER Comirnaty(Gray Top)Covid-19 Tri-Sucrose Vaccine 01/25/2019, 02/15/2019   PFIZER(Purple Top)SARS-COV-2 Vaccination 01/25/2019, 02/15/2019   Pneumococcal Conjugate-13 08/07/2014   Pneumococcal Polysaccharide-23 01/06/2016   Td 08/05/2014   Tdap 01/04/2009   Zoster, Live 03/02/2012    Qualifies for Shingles Vaccine?yes - she is going to get it this weekend  Screening Tests Health Maintenance  Topic Date Due   COVID-19 Vaccine (5 - 2023-24 season) 03/05/2022 (Originally 09/04/2021)   INFLUENZA VACCINE  04/04/2022 (Originally 08/04/2021)   Zoster Vaccines- Shingrix (1 of 2) 05/18/2022 (Originally 02/04/1999)   DEXA SCAN  10/15/2022   MAMMOGRAM  10/23/2022   Medicare Annual Wellness (AWV)  02/18/2023    DTaP/Tdap/Td (3 - Td or Tdap) 08/04/2024   COLONOSCOPY (Pts 45-88yr Insurance coverage will need to be confirmed)  08/14/2024   Pneumonia Vaccine 72 Years old  Completed   Hepatitis C Screening  Completed   HPV VACCINES  Aged Out    Cancer Screenings: Lung: Low Dose CT Chest recommended if Age 73-80years, 30 pack-year currently smoking OR have quit w/in 15years. Patient does not qualify. Breast:  Up to date on Mammogram? Yes   Up to date of Bone Density/Dexa? Yes Colorectal: UTD next due 2026  Additional Screenings:  Hepatitis C Screening: done previously      Plan:      I have personally reviewed and noted the following in the patient's chart:   Medical and social history Use of alcohol, tobacco or illicit drugs  Current medications and supplements Functional ability and status Nutritional status Physical activity Advanced directives - discussed ACP she has packet  at home- husband List of other physicians Hospitalizations, surgeries, and ER visits in previous 12 months Vitals Screenings to include cognitive, depression, and falls Referrals and appointments  In addition, I have reviewed and discussed with patient certain preventive protocols, quality metrics, and best practice recommendations. A written personalized care plan for preventive services as well as general preventive health recommendations were provided to patient.     Delsa Grana, PA-C  02/17/2022

## 2022-02-16 NOTE — Progress Notes (Unsigned)
Name: Pamela Dodson   MRN: [id number]    DOB: 05-Jun-1950   Date:02/17/2022       Progress Note  Chief Complaint  Patient presents with   Follow-up   Hypertension   Hypothyroidism     Subjective:   Pamela Dodson is a 72 y.o. female, presents to clinic for routine follow up  Hypertension: states meds are managed by nephrology - reviewed last OV and labs Currently managed on amlodipine-benazepril 10-40 mg- no change to meds, good compliance Blood pressure today is well controlled. BP Readings from Last 3 Encounters:  02/17/22 112/78  08/04/21 116/78  01/06/21 124/70   Pt denies CP, SOB, exertional sx, LE edema, palpitation, Ha's, visual disturbances, lightheadedness, hypotension, syncope. Dietary efforts for BP?  Walking/active   Postsurgical hypothyroid  Recent abnormal labs normalized when some of her supplements were adjusted Lab Results  Component Value Date   TSH 1.91 10/05/2021   TSH 6.98 (H) 08/17/2021   TSH 9.31 (H) 08/05/2021   TSH 4.30 10/07/2020   TSH 1.94 12/11/2019   TSH 0.71 03/09/2019  She feels mood/energy/weight are good    Postmenopausal syndrome, palpitations and sweats patient is managed on Effexor 37.5 mg daily and this is doing well she would like to remain on it her mood is also good She asks about stopping the medications   Hyperlipidemia  Managed with diet, lipitor 80 Last Lipids: Lab Results  Component Value Date   CHOL 171 08/05/2021   HDL 45 (L) 08/05/2021   LDLCALC 98 08/05/2021   TRIG 186 (H) 08/05/2021   CHOLHDL 3.8 08/05/2021   Prediabetes Lab Results  Component Value Date   HGBA1C 5.6 08/05/2021      Current Outpatient Medications:    acetaminophen (TYLENOL) 500 MG tablet, Take by mouth., Disp: , Rfl:    amLODipine-benazepril (LOTREL) 10-40 MG capsule, TAKE 1 CAPSULE BY MOUTH AT  BEDTIME, Disp: 90 capsule, Rfl: 3   Ascorbic Acid (VITAMIN C) 100 MG tablet, Take 100 mg by mouth daily., Disp: , Rfl:    atorvastatin  (LIPITOR) 80 MG tablet, TAKE 1 TABLET BY MOUTH  DAILY, Disp: 90 tablet, Rfl: 3   Cholecalciferol 1000 UNITS capsule, Take 1,000 Units by mouth daily., Disp: , Rfl:    Cholecalciferol 25 MCG (1000 UT) capsule, Take by mouth., Disp: , Rfl:    ipratropium-albuterol (DUONEB) 0.5-2.5 (3) MG/3ML SOLN, Take 3 mLs by nebulization every 2 (two) hours as needed., Disp: 360 mL, Rfl: 3   levothyroxine (SYNTHROID) 175 MCG tablet, Take 1 tablet (175 mcg total) by mouth daily before breakfast., Disp: 90 tablet, Rfl: 3   loratadine (CLARITIN) 10 MG tablet, Take 10 mg by mouth daily., Disp: , Rfl:    Multiple Vitamin (MULTIVITAMIN) tablet, Take 1 tablet by mouth daily., Disp: , Rfl:    Omega-3 Fatty Acids (FISH OIL) 1000 MG CPDR, Take 1,000 mg by mouth 2 (two) times daily., Disp: , Rfl:    valACYclovir (VALTREX) 1000 MG tablet, TAKE 1 TABLET BY MOUTH  DAILY FOR 5 DAYS AT FIRST  SIGN OF COLD SORE. MAY TAKE 1 TABLET BY MOUTH DAILY FOR PREVENTION, Disp: 90 tablet, Rfl: 3   venlafaxine XR (EFFEXOR-XR) 37.5 MG 24 hr capsule, TAKE 1 CAPSULE BY MOUTH  DAILY WITH BREAKFAST, Disp: 90 capsule, Rfl: 3   Black Cohosh 160 MG CAPS, Take by mouth., Disp: , Rfl:    LORazepam (ATIVAN) 1 MG tablet, Take 1 mg by mouth daily as needed., Disp: , Rfl:  ondansetron (ZOFRAN-ODT) 4 MG disintegrating tablet, Take 4 mg by mouth every 8 (eight) hours as needed., Disp: , Rfl:   Patient Active Problem List   Diagnosis Date Noted   History of total hip replacement, right 01/09/2020   History of resection of large bowel 03/13/2019   Aortic atherosclerosis ([place]) 03/13/2019   Coronary atherosclerosis 03/13/2019   Benign essential hypertension 10/10/2018   Chronic kidney disease, stage II (mild) 10/10/2018   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   Obesity (BMI 30.0-34.9) 01/16/2018   Proteinuria 07/15/2017   Urine test positive for microalbuminuria 07/15/2017   Prediabetes 07/12/2016   Abnormal mammogram of left breast 07/21/2015   Postsurgical  intestinal bypass or anastomosis status    Hypertension goal BP (blood pressure) < 150/90 08/07/2014   Hot flash, menopausal 08/07/2014   Lumbar disc disease with radiculopathy 123XX123   Dysmetabolic syndrome 123XX123   Post-surgical hypothyroidism 08/07/2014   Generalized OA 08/07/2014   Hypercholesterolemia without hypertriglyceridemia 08/07/2014   Hernia of anterior abdominal wall 08/07/2014   Vestibular neuronitis 08/07/2014   Arthritis of hip 12/16/2010    Past Surgical History:  Procedure Laterality Date   APPENDECTOMY     CARPAL TUNNEL RELEASE Left 2016   CARPAL TUNNEL RELEASE Right 05/05/2019   CESAREAN SECTION     X2   CHOLECYSTECTOMY     COLON SURGERY     diverticulitic mass removed AND COLOSTOMY    COLONOSCOPY WITH PROPOFOL N/A 08/15/2014   Procedure: COLONOSCOPY WITH PROPOFOL;  Surgeon: Lucilla Lame, MD;  Location: [facility];  Service: Endoscopy;  Laterality: N/A;   COLOSTOMY REVERSAL     GANGLION CYST EXCISION     L wrist   GLOMUS TUMOR EXCISION Left 12/12/2017   JOINT REPLACEMENT     knee arthroscopy     THYROIDECTOMY  2009   TOTAL HIP ARTHROPLASTY  12/16/2010   Procedure: TOTAL HIP ARTHROPLASTY;  Surgeon: Kerin Salen;  Location: [facility];  Service: Orthopedics;  Laterality: Left;  Depuy   TOTAL HIP ARTHROPLASTY Right 01/09/2020   Procedure: TOTAL HIP ARTHROPLASTY ANTERIOR APPROACH;  Surgeon: Lovell Sheehan, MD;  Location: [facility];  Service: Orthopedics;  Laterality: Right;   TOTAL KNEE ARTHROPLASTY Bilateral 2009    Family History  Problem Relation Age of Onset   Cancer Mother        lung   Myelodysplastic syndrome Mother    Hypothyroidism Mother    Hypertension Brother    Stroke Brother    Atrial fibrillation Brother    Birth defects Son        congenital genetic abnormality   Dementia Maternal Grandfather    Heart disease Paternal Grandmother        heart disease in her 45's   Stroke Paternal Grandmother    Heart attack Paternal  Grandfather    Heart disease Paternal Aunt    Heart attack Paternal Aunt    Stroke Paternal Aunt    Atrial fibrillation Paternal Uncle     Social History   Tobacco Use   Smoking status: Never   Smokeless tobacco: Never  Vaping Use   Vaping Use: Never used  Substance Use Topics   Alcohol use: Yes    Alcohol/week: 1.0 standard drink of alcohol    Types: 1 Glasses of wine per week    Comment: occasional   Drug use: No     Allergies  Allergen Reactions   Toradol [Ketorolac Tromethamine] Other (See Comments)    Intolerance to NSAIDs due to CKD  Health Maintenance  Topic Date Due   Medicare Annual Wellness (AWV)  11/10/2019   COVID-19 Vaccine (5 - 2023-24 season) 03/05/2022 (Originally 09/04/2021)   INFLUENZA VACCINE  04/04/2022 (Originally 08/04/2021)   Zoster Vaccines- Shingrix (1 of 2) 05/18/2022 (Originally 02/04/1999)   DEXA SCAN  10/15/2022   MAMMOGRAM  10/23/2022   DTaP/Tdap/Td (3 - Td or Tdap) 08/04/2024   COLONOSCOPY (Pts 45-73yr Insurance coverage will need to be confirmed)  08/14/2024   Pneumonia Vaccine 72 Years old  Completed   Hepatitis C Screening  Completed   HPV VACCINES  Aged Out    Chart Review Today: I personally reviewed active problem list, medication list, allergies, family history, social history, health maintenance, notes from last encounter, lab results, imaging with the patient/caregiver today.   Review of Systems  Constitutional: Negative.   HENT: Negative.    Eyes: Negative.   Respiratory: Negative.    Cardiovascular: Negative.   Gastrointestinal: Negative.   Endocrine: Negative.   Genitourinary: Negative.   Musculoskeletal: Negative.   Skin: Negative.   Allergic/Immunologic: Negative.   Neurological: Negative.   Hematological: Negative.   Psychiatric/Behavioral: Negative.    All other systems reviewed and are negative.    Objective:   Vitals:   02/17/22 1406  BP: 112/78  Pulse: 98  Resp: 16  Temp: 97.8 F (36.6 C)   TempSrc: Oral  SpO2: 94%  Weight: 195 lb 6.4 oz (88.6 kg)  Height: 5' 3"$  (1.6 m)    Body mass index is 34.61 kg/m.  Physical Exam Vitals and nursing note reviewed.  Constitutional:      General: She is not in acute distress.    Appearance: Normal appearance. She is well-developed. She is obese. She is not ill-appearing, toxic-appearing or diaphoretic.  HENT:     Head: Normocephalic and atraumatic.     Nose: Nose normal.  Eyes:     General:        Right eye: No discharge.        Left eye: No discharge.     Conjunctiva/sclera: Conjunctivae normal.  Neck:     Trachea: No tracheal deviation.  Cardiovascular:     Rate and Rhythm: Normal rate and regular rhythm.     Pulses: Normal pulses.     Heart sounds: Normal heart sounds.  Pulmonary:     Effort: Pulmonary effort is normal. No respiratory distress.     Breath sounds: Normal breath sounds. No stridor.  Musculoskeletal:        General: Normal range of motion.  Skin:    General: Skin is warm and dry.     Findings: No rash.  Neurological:     Mental Status: She is alert.     Motor: No abnormal muscle tone.     Coordination: Coordination normal.  Psychiatric:        Behavior: Behavior normal.         Assessment & Plan:   Problem List Items Addressed This Visit       Cardiovascular and Mediastinum   Hot flash, menopausal - Primary    Sx currently well controlled on effexor - has questions about getting off meds Slow taper off reviewed - can do every other day for 2 weeks then every 3 days for 2 weeks then d/c  Discussed signs and sx to watch for - mood changes, irritability, return of hot flashes/palpitations      Relevant Medications   atorvastatin (LIPITOR) 80 MG tablet   Benign essential hypertension  Managed by nephro on amlodipine-benazepril 10-40 Good med compliance BP at goal today BP Readings from Last 3 Encounters:  02/17/22 112/78  08/04/21 116/78  01/06/21 124/70       Relevant  Medications   atorvastatin (LIPITOR) 80 MG tablet     Endocrine   Post-surgical hypothyroidism    Labs normalized when rechecked Oct 2023, no dose changes Pt feels euthyroid No med changes, will recheck labs at next OV in 6 months        Other   Hypercholesterolemia without hypertriglyceridemia    Compliant with meds- lipitor 80, omega-3 supplement, diet and lifestyle efforts no SE, no myalgias, fatigue or jaundice       Relevant Medications   atorvastatin (LIPITOR) 80 MG tablet   Other Visit Diagnoses     Hot flashes       med refill   Relevant Medications   venlafaxine XR (EFFEXOR-XR) 37.5 MG 24 hr capsule   atorvastatin (LIPITOR) 80 MG tablet   Cold sore       she has been doing daily suppressive dosing after getting cold sores very often - tolerating meds well, no recent outbreaks, meds refilled   Relevant Medications   valACYclovir (VALTREX) 1000 MG tablet   Encounter for screening mammogram for malignant neoplasm of breast       Relevant Orders   MM 3D SCREEN BREAST BILATERAL   Postmenopausal estrogen deficiency       Relevant Orders   DG Bone Density   Medicare annual wellness visit, subsequent            Return in about 6 months (around 08/18/2022) for Routine follow-up with labs.   Delsa Grana, PA-C 02/17/22 2:16 PM

## 2022-02-17 ENCOUNTER — Encounter: Payer: Self-pay | Admitting: Family Medicine

## 2022-02-17 ENCOUNTER — Ambulatory Visit (INDEPENDENT_AMBULATORY_CARE_PROVIDER_SITE_OTHER): Payer: Medicare Other | Admitting: Family Medicine

## 2022-02-17 VITALS — BP 112/78 | HR 98 | Temp 97.8°F | Resp 16 | Ht 63.0 in | Wt 195.4 lb

## 2022-02-17 DIAGNOSIS — B001 Herpesviral vesicular dermatitis: Secondary | ICD-10-CM

## 2022-02-17 DIAGNOSIS — Z0000 Encounter for general adult medical examination without abnormal findings: Secondary | ICD-10-CM

## 2022-02-17 DIAGNOSIS — E7800 Pure hypercholesterolemia, unspecified: Secondary | ICD-10-CM

## 2022-02-17 DIAGNOSIS — R232 Flushing: Secondary | ICD-10-CM | POA: Diagnosis not present

## 2022-02-17 DIAGNOSIS — Z1231 Encounter for screening mammogram for malignant neoplasm of breast: Secondary | ICD-10-CM

## 2022-02-17 DIAGNOSIS — N951 Menopausal and female climacteric states: Secondary | ICD-10-CM | POA: Diagnosis not present

## 2022-02-17 DIAGNOSIS — E890 Postprocedural hypothyroidism: Secondary | ICD-10-CM

## 2022-02-17 DIAGNOSIS — Z780 Asymptomatic menopausal state: Secondary | ICD-10-CM

## 2022-02-17 DIAGNOSIS — I700 Atherosclerosis of aorta: Secondary | ICD-10-CM | POA: Diagnosis not present

## 2022-02-17 DIAGNOSIS — I10 Essential (primary) hypertension: Secondary | ICD-10-CM

## 2022-02-17 DIAGNOSIS — Z5181 Encounter for therapeutic drug level monitoring: Secondary | ICD-10-CM

## 2022-02-17 MED ORDER — VALACYCLOVIR HCL 1 G PO TABS: ORAL_TABLET | ORAL | 3 refills | Status: DC

## 2022-02-17 MED ORDER — ATORVASTATIN CALCIUM 80 MG PO TABS: 80.0000 mg | ORAL_TABLET | Freq: Every day | ORAL | 3 refills | Status: DC

## 2022-02-17 MED ORDER — VENLAFAXINE HCL ER 37.5 MG PO CP24: 37.5000 mg | ORAL_CAPSULE | Freq: Every day | ORAL | 3 refills | Status: DC

## 2022-02-17 NOTE — Assessment & Plan Note (Signed)
Compliant with meds- lipitor 80, omega-3 supplement, diet and lifestyle efforts no SE, no myalgias, fatigue or jaundice

## 2022-02-17 NOTE — Patient Instructions (Addendum)
  Pamela Dodson ,  Call [facility] to schedule your bone density and mammogram.  They are due later in the year.   Please notify them that you are having to switch from [facility] imaging to them and that will help them be ready for your next screenings.  [facility] [address], [place], [place] [address] Scheduling phone #: [phone]    These are prior goals discussed with last Medicare well visit. It sounds like you still have weight goals and desire to remain physically active.  Goals      Increase physical activity     Weight (lb) < 175 lb (79.4 kg)     Pt states she would like to lose weight over the next year with healthy eating and physical activity        This is a list of the screening recommended for you and due dates:   Health Maintenance  Topic Date Due   Medicare Annual Wellness Visit  11/10/2019   COVID-19 Vaccine (5 - 2023-24 season) 03/05/2022*   Flu Shot  04/04/2022*   Zoster (Shingles) Vaccine (1 of 2) 05/18/2022*   DEXA scan (bone density measurement)  10/15/2022   Mammogram  10/23/2022   DTaP/Tdap/Td vaccine (3 - Td or Tdap) 08/04/2024   Colon Cancer Screening  08/14/2024   Pneumonia Vaccine  Completed   Hepatitis C Screening: USPSTF Recommendation to screen - Ages 75-79 yo.  Completed   HPV Vaccine  Aged Out  *Topic was postponed. The date shown is not the original due date.

## 2022-02-17 NOTE — Assessment & Plan Note (Signed)
Sx currently well controlled on effexor - has questions about getting off meds Slow taper off reviewed - can do every other day for 2 weeks then every 3 days for 2 weeks then d/c  Discussed signs and sx to watch for - mood changes, irritability, return of hot flashes/palpitations

## 2022-02-17 NOTE — Assessment & Plan Note (Signed)
Managed by nephro on amlodipine-benazepril 10-40 Good med compliance BP at goal today BP Readings from Last 3 Encounters:  02/17/22 112/78  08/04/21 116/78  01/06/21 124/70

## 2022-02-17 NOTE — Assessment & Plan Note (Signed)
Labs normalized when rechecked Oct 2023, no dose changes Pt feels euthyroid No med changes, will recheck labs at next OV in 6 months

## 2022-02-17 NOTE — Assessment & Plan Note (Deleted)
Managed by nephro on amlodipine-benazepril 10-40 Good med compliance BP at goal today BP Readings from Last 3 Encounters:  02/17/22 112/78  08/04/21 116/78  01/06/21 124/70

## 2022-03-02 ENCOUNTER — Other Ambulatory Visit: Payer: Self-pay | Admitting: Family Medicine

## 2022-03-02 DIAGNOSIS — E890 Postprocedural hypothyroidism: Secondary | ICD-10-CM

## 2022-03-02 MED ORDER — LEVOTHYROXINE SODIUM 175 MCG PO TABS: 175.0000 ug | ORAL_TABLET | Freq: Every day | ORAL | 0 refills | Status: DC

## 2022-03-02 NOTE — Telephone Encounter (Signed)
Requested Prescriptions  Pending Prescriptions Disp Refills   levothyroxine (SYNTHROID) 175 MCG tablet 10 tablet 0    Sig: Take 1 tablet (175 mcg total) by mouth daily before breakfast.     Endocrinology:  Hypothyroid Agents Passed - 03/02/2022 12:19 PM      Passed - TSH in normal range and within 360 days    TSH  Date Value Ref Range Status  10/05/2021 1.91 0.40 - 4.50 mIU/L Final         Passed - Valid encounter within last 12 months    Recent Outpatient Visits           1 week ago Hot flash, menopausal   [facility] [place], Kristeen Miss, PA-C   7 months ago Hypertension goal BP (blood pressure) < 150/90   [facility] [place], Kristeen Miss, PA-C   11 months ago Viral upper respiratory tract infection   [facility] Delsa Grana, PA-C   11 months ago Viral upper respiratory tract infection   [place], DO   1 year ago Acute cough   [facility] Teodora Medici, DO       Future Appointments             In 5 months Delsa Grana, [facility], [facility]

## 2022-03-02 NOTE — Telephone Encounter (Signed)
Medication Refill - Medication: levothyroxine (SYNTHROID) 175 MCG tablet   Pt is requesting an emergency short supply for her local pharmacy. She says that her mail delivery says her order is on the way but she is afraid that she will not have her supply by tomorrow when she runs out because they only just told her today that the order has been shipped. She will be out by tomorrow. She says this is an immediate concern.   Has the patient contacted their pharmacy? Yes.   (Agent: If no, request that the patient contact the pharmacy for the refill. If patient does not wish to contact the pharmacy document the reason why and proceed with request.) (Agent: If yes, when and what did the pharmacy advise?)  Preferred Pharmacy (with phone number or street name):  [facility] [address], [address]  [place] [address]  Phone: [phone] Fax: [phone]   Has the patient been seen for an appointment in the last year OR does the patient have an upcoming appointment? Yes.    Agent: Please be advised that RX refills may take up to 3 business days. We ask that you follow-up with your pharmacy.

## 2022-03-11 ENCOUNTER — Other Ambulatory Visit: Payer: Self-pay | Admitting: Family Medicine

## 2022-03-11 DIAGNOSIS — E890 Postprocedural hypothyroidism: Secondary | ICD-10-CM

## 2022-05-28 ENCOUNTER — Encounter: Payer: Self-pay | Admitting: Family Medicine

## 2022-07-12 ENCOUNTER — Other Ambulatory Visit: Payer: Self-pay | Admitting: Family Medicine

## 2022-07-12 DIAGNOSIS — E890 Postprocedural hypothyroidism: Secondary | ICD-10-CM

## 2022-08-03 ENCOUNTER — Other Ambulatory Visit: Payer: Self-pay | Admitting: Family Medicine

## 2022-08-03 DIAGNOSIS — I10 Essential (primary) hypertension: Secondary | ICD-10-CM

## 2022-08-15 ENCOUNTER — Encounter: Payer: Self-pay | Admitting: Family Medicine

## 2022-08-23 ENCOUNTER — Ambulatory Visit: Payer: Medicare Other | Admitting: Family Medicine

## 2022-08-30 ENCOUNTER — Ambulatory Visit: Payer: Medicare Other | Admitting: Family Medicine

## 2022-09-02 ENCOUNTER — Encounter: Payer: Self-pay | Admitting: Family Medicine

## 2022-09-02 ENCOUNTER — Ambulatory Visit (INDEPENDENT_AMBULATORY_CARE_PROVIDER_SITE_OTHER): Payer: Medicare Other | Admitting: Family Medicine

## 2022-09-02 VITALS — BP 126/72 | HR 88 | Temp 98.2°F | Resp 16 | Ht 63.0 in | Wt 199.3 lb

## 2022-09-02 DIAGNOSIS — I10 Essential (primary) hypertension: Secondary | ICD-10-CM | POA: Diagnosis not present

## 2022-09-02 DIAGNOSIS — J069 Acute upper respiratory infection, unspecified: Secondary | ICD-10-CM

## 2022-09-02 DIAGNOSIS — N951 Menopausal and female climacteric states: Secondary | ICD-10-CM | POA: Diagnosis not present

## 2022-09-02 DIAGNOSIS — E7800 Pure hypercholesterolemia, unspecified: Secondary | ICD-10-CM

## 2022-09-02 DIAGNOSIS — I700 Atherosclerosis of aorta: Secondary | ICD-10-CM

## 2022-09-02 DIAGNOSIS — G2581 Restless legs syndrome: Secondary | ICD-10-CM

## 2022-09-02 DIAGNOSIS — I2510 Atherosclerotic heart disease of native coronary artery without angina pectoris: Secondary | ICD-10-CM

## 2022-09-02 DIAGNOSIS — Z1231 Encounter for screening mammogram for malignant neoplasm of breast: Secondary | ICD-10-CM

## 2022-09-02 DIAGNOSIS — R7303 Prediabetes: Secondary | ICD-10-CM

## 2022-09-02 DIAGNOSIS — G4701 Insomnia due to medical condition: Secondary | ICD-10-CM

## 2022-09-02 DIAGNOSIS — E890 Postprocedural hypothyroidism: Secondary | ICD-10-CM

## 2022-09-02 DIAGNOSIS — Z6835 Body mass index (BMI) 35.0-35.9, adult: Secondary | ICD-10-CM

## 2022-09-02 DIAGNOSIS — Z5181 Encounter for therapeutic drug level monitoring: Secondary | ICD-10-CM

## 2022-09-02 MED ORDER — PREGABALIN 25 MG PO CAPS
25.0000 mg | ORAL_CAPSULE | Freq: Every evening | ORAL | 0 refills | Status: DC | PRN
Start: 2022-09-02 — End: 2022-10-12

## 2022-09-02 NOTE — Assessment & Plan Note (Signed)
No sx concerning for ACS, HTN well controlled and pt on statin, no exertional sx

## 2022-09-02 NOTE — Assessment & Plan Note (Signed)
Euthyroid sx and prior labs on 175 mcg daily No new sx or concerns S/p thyroidectomy/resection x 2 h/l thyroid cancer

## 2022-09-02 NOTE — Progress Notes (Signed)
Name: Pamela Dodson   MRN: [id number]    DOB: 1950-01-21   Date:09/02/2022       Progress Note  Chief Complaint  Patient presents with   Follow-up   Hypothyroidism   Hypertension     Subjective:   Pamela Dodson is a 72 y.o. female, presents to clinic for routine f/up on chronic conditions  Hypothyroid, on 175 mcg, no sx or concerns Lab Results  Component Value Date   TSH 1.91 10/05/2021   Hypertension:  Currently managed on amlodipine-benazepril 10-40 Pt reports good med compliance and denies any SE.   Blood pressure today is well controlled. BP Readings from Last 3 Encounters:  09/02/22 126/72  02/17/22 112/78  08/04/21 116/78   Pt denies CP, SOB, exertional sx, palpitation, Ha's, visual disturbances, lightheadedness, hypotension, syncope. Some chronic mild LE edema at the end of the day - stable, not bothersome  Hyperlipidemia: Currently treated with lipitor 80 mg daily, pt reports good med compliance Last Lipids: Lab Results  Component Value Date   CHOL 171 08/05/2021   HDL 45 (L) 08/05/2021   LDLCALC 98 08/05/2021   TRIG 186 (H) 08/05/2021   CHOLHDL 3.8 08/05/2021   - Denies: Chest pain, shortness of breath, myalgias, claudication   Lab Results  Component Value Date   HGBA1C 5.6 08/05/2021    New restless leg sx brought up today occurs intermittently, described at [facility] keeping her up and occurs more with days when she is on her legs more, not nerve pain or tingling, cannot fall asleep - leg movements Wants to try meds for it, no past eval or labs, she is on several OTC supplements vitamins  Recent URI sx, she tested for covid at home and it was neg - wearing mask today - she notes URIs tend to go to her chest and she is using her nebs, no pleuritic CP, SOB   Current Outpatient Medications:    acetaminophen (TYLENOL) 500 MG tablet, Take by mouth., Disp: , Rfl:    amLODipine-benazepril (LOTREL) 10-40 MG capsule, TAKE 1 CAPSULE BY MOUTH AT   BEDTIME, Disp: 90 capsule, Rfl: 0   Ascorbic Acid (VITAMIN C) 100 MG tablet, Take 100 mg by mouth daily., Disp: , Rfl:    atorvastatin (LIPITOR) 80 MG tablet, Take 1 tablet (80 mg total) by mouth daily., Disp: 90 tablet, Rfl: 3   Cholecalciferol 1000 UNITS capsule, Take 1,000 Units by mouth daily., Disp: , Rfl:    Cholecalciferol 25 MCG (1000 UT) capsule, Take by mouth., Disp: , Rfl:    ipratropium-albuterol (DUONEB) 0.5-2.5 (3) MG/3ML SOLN, Take 3 mLs by nebulization every 2 (two) hours as needed., Disp: 360 mL, Rfl: 3   KERENDIA 10 MG TABS, Take 1 tablet by mouth daily., Disp: , Rfl:    levothyroxine (SYNTHROID) 175 MCG tablet, Take 1 tablet (175 mcg total) by mouth daily before breakfast., Disp: 10 tablet, Rfl: 0   loratadine (CLARITIN) 10 MG tablet, Take 10 mg by mouth daily., Disp: , Rfl:    Multiple Vitamin (MULTIVITAMIN) tablet, Take 1 tablet by mouth daily., Disp: , Rfl:    Omega-3 Fatty Acids (FISH OIL) 1000 MG CPDR, Take 1,000 mg by mouth 2 (two) times daily., Disp: , Rfl:    valACYclovir (VALTREX) 1000 MG tablet, TAKE 1 TABLET BY MOUTH  DAILY FOR 5 DAYS AT FIRST  SIGN OF COLD SORE. 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   Obesity (BMI 30.0-34.9) 01/16/2018   Proteinuria 07/15/2017   Urine test positive for microalbuminuria 07/15/2017   Prediabetes 07/12/2016   Abnormal mammogram of left breast 07/21/2015   Postsurgical intestinal bypass or anastomosis  status    Hypertension goal BP (blood pressure) < 150/90 08/07/2014   Hot flash, menopausal 08/07/2014   Lumbar disc disease with radiculopathy 08/07/2014   Dysmetabolic syndrome 08/07/2014   Post-surgical hypothyroidism 08/07/2014   Generalized OA 08/07/2014   Hypercholesterolemia without hypertriglyceridemia 08/07/2014   Hernia of anterior abdominal wall 08/07/2014   Vestibular neuronitis 08/07/2014   Arthritis of hip 12/16/2010    Past Surgical History:  Procedure Laterality Date   APPENDECTOMY     CARPAL TUNNEL RELEASE Left 2016   CARPAL TUNNEL RELEASE Right 05/05/2019   CESAREAN SECTION     X2   CHOLECYSTECTOMY     COLON SURGERY     diverticulitic mass removed AND COLOSTOMY    COLONOSCOPY WITH PROPOFOL N/A 08/15/2014   Procedure: COLONOSCOPY WITH PROPOFOL;  Surgeon: Midge Minium, MD;  Location: [facility];  Service: Endoscopy;  Laterality: N/A;   COLOSTOMY REVERSAL     GANGLION CYST EXCISION     L wrist   GLOMUS TUMOR EXCISION Left 12/12/2017   JOINT REPLACEMENT     knee arthroscopy     THYROIDECTOMY  2009   TOTAL HIP ARTHROPLASTY  12/16/2010   Procedure: TOTAL HIP ARTHROPLASTY;  Surgeon: Nestor Lewandowsky;  Location: [facility];  Service: Orthopedics;  Laterality: Left;  Depuy   TOTAL HIP ARTHROPLASTY Right 01/09/2020   Procedure: TOTAL HIP ARTHROPLASTY ANTERIOR APPROACH;  Surgeon: Lyndle Herrlich, MD;  Location: [facility];  Service: Orthopedics;  Laterality: Right;   TOTAL KNEE ARTHROPLASTY Bilateral 2009    Family History  Problem Relation Age of Onset   Cancer Mother        lung   Myelodysplastic syndrome Mother    Hypothyroidism Mother    Hypertension Brother    Stroke Brother    Atrial fibrillation Brother    Birth defects Son        congenital genetic abnormality   Dementia Maternal Grandfather    Heart disease Paternal Grandmother        heart disease in her 75's   Stroke Paternal Grandmother    Heart attack Paternal Grandfather    Heart disease Paternal  Aunt    Heart attack Paternal Aunt    Stroke Paternal Aunt    Atrial fibrillation Paternal Uncle     Social History   Tobacco Use   Smoking status: Never   Smokeless tobacco: Never  Vaping Use   Vaping status: Never Used  Substance Use Topics   Alcohol use: Yes    Alcohol/week: 1.0 standard drink of alcohol    Types: 1 Glasses of wine per week    Comment: occasional   Drug use: No     Allergies  Allergen Reactions   Toradol [Ketorolac Tromethamine] Other (See Comments)    Intolerance to NSAIDs due to CKD  Health Maintenance  Topic Date Due   INFLUENZA VACCINE  08/05/2022   COVID-19 Vaccine (3 - Pfizer risk series) 09/18/2022 (Originally 03/15/2019)   Zoster Vaccines- Shingrix (1 of 2) 12/03/2022 (Originally 02/04/1969)   DEXA SCAN  10/15/2022   MAMMOGRAM  10/23/2022   Medicare Annual Wellness (AWV)  02/18/2023   DTaP/Tdap/Td (3 - Td or Tdap) 08/04/2024   Colonoscopy  08/14/2024   Pneumonia Vaccine 8+ Years old  Completed   Hepatitis C Screening  Completed   HPV VACCINES  Aged Out    Chart Review Today: I personally reviewed active problem list, medication list, allergies, family history, social history, health maintenance, notes from last encounter, lab results, imaging with the patient/caregiver today.   Review of Systems  Constitutional: Negative.   HENT: Negative.    Eyes: Negative.   Respiratory: Negative.    Cardiovascular: Negative.   Gastrointestinal: Negative.   Endocrine: Negative.   Genitourinary: Negative.   Musculoskeletal: Negative.   Skin: Negative.   Allergic/Immunologic: Negative.   Neurological: Negative.   Hematological: Negative.   Psychiatric/Behavioral: Negative.    All other systems reviewed and are negative.    Objective:   Vitals:   09/02/22 1125  BP: 126/72  Pulse: 88  Resp: 16  Temp: 98.2 F (36.8 C)  TempSrc: Oral  SpO2: 98%  Weight: 199 lb 4.8 oz (90.4 kg)  Height: 5\' 3"  (1.6 m)    Body mass index is 35.3  kg/m.  Physical Exam Vitals and nursing note reviewed.  Constitutional:      General: She is not in acute distress.    Appearance: Normal appearance. She is well-developed and well-groomed. She is not ill-appearing, toxic-appearing or diaphoretic.     Interventions: Face mask in place.  HENT:     Head: Normocephalic and atraumatic.  Eyes:     General: No scleral icterus.       Right eye: No discharge.        Left eye: No discharge.     Conjunctiva/sclera: Conjunctivae normal.  Neck:     Trachea: Phonation normal. No tracheal deviation.  Cardiovascular:     Rate and Rhythm: Normal rate and regular rhythm.     Pulses: Normal pulses.     Heart sounds: Normal heart sounds. No murmur heard.    No friction rub. No gallop.  Pulmonary:     Effort: Pulmonary effort is normal. No respiratory distress.     Breath sounds: Normal breath sounds. No stridor. No wheezing or rales.  Musculoskeletal:     Right lower leg: No edema.     Left lower leg: No edema.  Skin:    General: Skin is warm and dry.     Findings: No rash.  Neurological:     Mental Status: She is alert.     Motor: No abnormal muscle tone.     Coordination: Coordination normal.     Gait: Gait normal.  Psychiatric:        Mood and Affect: Mood normal.        Behavior: Behavior normal. Behavior is cooperative.         Assessment & Plan:   Problem List Items Addressed This Visit       Cardiovascular and Mediastinum   Hot flash, menopausal    Still managing with effexor with hopes to wean off eventually, prior HRT      Benign essential hypertension - Primary    Currently managed on amlodipine-benazepril 10-40 Pt reports good med compliance  and denies any SE.   Blood pressure today is well controlled. BP Readings from Last 3 Encounters:  09/02/22 126/72  02/17/22 112/78  08/04/21 116/78   Pt denies CP, SOB, exertional sx, palpitation, Ha's, visual disturbances, lightheadedness, hypotension, syncope. Some  chronic mild LE edema at the end of the day - stable, not bothersome  Stable, well controlled at goal, no changes      Relevant Orders   COMPLETE METABOLIC PANEL WITH GFR   Aortic atherosclerosis (HCC)    On statin, monitoring, per prior CT imaging      Relevant Orders   COMPLETE METABOLIC PANEL WITH GFR   Lipid panel   Coronary atherosclerosis    No sx concerning for ACS, HTN well controlled and pt on statin, no exertional sx      Relevant Orders   COMPLETE METABOLIC PANEL WITH GFR   Lipid panel     Endocrine   Post-surgical hypothyroidism    Euthyroid sx and prior labs on 175 mcg daily No new sx or concerns S/p thyroidectomy/resection x 2 h/l thyroid cancer      Relevant Orders   COMPLETE METABOLIC PANEL WITH GFR   TSH   CBC with Differential/Platelet     Other   Class 2 severe obesity with serious comorbidity and body mass index (BMI) of 35.0 to 35.9 in adult (HCC)    BMI increased with hx of multiple comorbidities including HTN, HLD, CKD, prediabetes, OA, metabolic dysfunction Last year she did loose some weight and has gained back only few lbs - likely accurate height measurement in clinic and updated in chart has affected BMI more than lbs Wt Readings from Last 5 Encounters:  09/02/22 199 lb 4.8 oz (90.4 kg)  02/17/22 195 lb 6.4 oz (88.6 kg)  08/04/21 198 lb 8 oz (90 kg)  01/06/21 204 lb (92.5 kg)  10/23/20 202 lb (91.6 kg)   BMI Readings from Last 5 Encounters:  09/02/22 35.30 kg/m  02/17/22 34.61 kg/m  08/04/21 35.16 kg/m  01/06/21 36.14 kg/m  10/23/20 35.78 kg/m         Relevant Orders   COMPLETE METABOLIC PANEL WITH GFR   Lipid panel   TSH   CBC with Differential/Platelet   Iron, TIBC and Ferritin Panel   Hypercholesterolemia without hypertriglyceridemia    Compliant with meds, on atorvastatin 80 mg daily, no SE, no myalgias, fatigue or jaundice Due for lipids  Recommend pt continue statin unless per labs a change is required, also  continue diet and exercise efforts for HLD        Relevant Orders   COMPLETE METABOLIC PANEL WITH GFR   Lipid panel   Prediabetes    Last A1C was in normal range - will check glucose and add on A1C if elevated      Relevant Orders   COMPLETE METABOLIC PANEL WITH GFR   Other Visit Diagnoses     Insomnia due to restless legs syndrome       check iron stores/ferritin, trial of lyrica QHS   Relevant Medications   pregabalin (LYRICA) 25 MG capsule   Other Relevant Orders   COMPLETE METABOLIC PANEL WITH GFR   TSH   CBC with Differential/Platelet   Iron, TIBC and Ferritin Panel   Encounter for medication monitoring       Relevant Orders   COMPLETE METABOLIC PANEL WITH GFR   Lipid panel   TSH   CBC with Differential/Platelet   Iron, TIBC and Ferritin Panel  Upper respiratory tract infection, unspecified type       recent travel, URI sx, some respiratory sx managed with nebs, she will test again at home for covid, initial test was negative   Encounter for screening mammogram for malignant neoplasm of breast       Relevant Orders   MM 3D SCREENING MAMMOGRAM BILATERAL BREAST        Return in about 6 months (around 03/04/2023) for Routine follow-up.  F/up sooner via mychart about efficacy of lyrica - if working well for pt will send 90 d rx to mail order  Danelle Berry, PA-C 09/02/22 11:39 AM

## 2022-09-02 NOTE — Assessment & Plan Note (Signed)
Last A1C was in normal range - will check glucose and add on A1C if elevated

## 2022-09-02 NOTE — Assessment & Plan Note (Signed)
Compliant with meds, on atorvastatin 80 mg daily, no SE, no myalgias, fatigue or jaundice Due for lipids  Recommend pt continue statin unless per labs a change is required, also continue diet and exercise efforts for HLD

## 2022-09-02 NOTE — Assessment & Plan Note (Signed)
Still managing with effexor with hopes to wean off eventually, prior HRT

## 2022-09-02 NOTE — Assessment & Plan Note (Deleted)
Currently managed on amlodipine-benazepril 10-40 Pt reports good med compliance and denies any SE.   Blood pressure today is well controlled. BP Readings from Last 3 Encounters:  09/02/22 126/72  02/17/22 112/78  08/04/21 116/78   Pt denies CP, SOB, exertional sx, palpitation, Ha's, visual disturbances, lightheadedness, hypotension, syncope. Some chronic mild LE edema at the end of the day - stable, not bothersome  Stable, well controlled at goal, no changes

## 2022-09-02 NOTE — Assessment & Plan Note (Signed)
Currently managed on amlodipine-benazepril 10-40 Pt reports good med compliance and denies any SE.   Blood pressure today is well controlled. BP Readings from Last 3 Encounters:  09/02/22 126/72  02/17/22 112/78  08/04/21 116/78   Pt denies CP, SOB, exertional sx, palpitation, Ha's, visual disturbances, lightheadedness, hypotension, syncope. Some chronic mild LE edema at the end of the day - stable, not bothersome  Stable, well controlled at goal, no changes

## 2022-09-02 NOTE — Assessment & Plan Note (Signed)
On statin, monitoring, per prior CT imaging

## 2022-09-02 NOTE — Assessment & Plan Note (Signed)
BMI increased with hx of multiple comorbidities including HTN, HLD, CKD, prediabetes, OA, metabolic dysfunction Last year she did loose some weight and has gained back only few lbs - likely accurate height measurement in clinic and updated in chart has affected BMI more than lbs Wt Readings from Last 5 Encounters:  09/02/22 199 lb 4.8 oz (90.4 kg)  02/17/22 195 lb 6.4 oz (88.6 kg)  08/04/21 198 lb 8 oz (90 kg)  01/06/21 204 lb (92.5 kg)  10/23/20 202 lb (91.6 kg)   BMI Readings from Last 5 Encounters:  09/02/22 35.30 kg/m  02/17/22 34.61 kg/m  08/04/21 35.16 kg/m  01/06/21 36.14 kg/m  10/23/20 35.78 kg/m

## 2022-09-03 LAB — CBC WITH DIFFERENTIAL/PLATELET
Absolute Monocytes: 454 {cells}/uL (ref 200–950)
Basophils Absolute: 28 {cells}/uL (ref 0–200)
Basophils Relative: 0.5 %
Eosinophils Absolute: 118 {cells}/uL (ref 15–500)
Eosinophils Relative: 2.1 %
HCT: 38.4 % (ref 35.0–45.0)
Hemoglobin: 12.6 g/dL (ref 11.7–15.5)
Lymphs Abs: 510 {cells}/uL — ABNORMAL LOW (ref 850–3900)
MCH: 32.2 pg (ref 27.0–33.0)
MCHC: 32.8 g/dL (ref 32.0–36.0)
MCV: 98.2 fL (ref 80.0–100.0)
MPV: 10.4 fL (ref 7.5–12.5)
Monocytes Relative: 8.1 %
Neutro Abs: 4491 {cells}/uL (ref 1500–7800)
Neutrophils Relative %: 80.2 %
Platelets: 260 10*3/uL (ref 140–400)
RBC: 3.91 10*6/uL (ref 3.80–5.10)
RDW: 13.5 % (ref 11.0–15.0)
Total Lymphocyte: 9.1 %
WBC: 5.6 10*3/uL (ref 3.8–10.8)

## 2022-09-03 LAB — COMPLETE METABOLIC PANEL WITH GFR
AG Ratio: 1.5 (calc) (ref 1.0–2.5)
ALT: 19 U/L (ref 6–29)
AST: 18 U/L (ref 10–35)
Albumin: 4.3 g/dL (ref 3.6–5.1)
Alkaline phosphatase (APISO): 70 U/L (ref 37–153)
BUN: 18 mg/dL (ref 7–25)
CO2: 27 mmol/L (ref 20–32)
Calcium: 9.5 mg/dL (ref 8.6–10.4)
Chloride: 104 mmol/L (ref 98–110)
Creat: 0.76 mg/dL (ref 0.60–1.00)
Globulin: 2.8 g/dL (ref 1.9–3.7)
Glucose, Bld: 124 mg/dL — ABNORMAL HIGH (ref 65–99)
Potassium: 4.2 mmol/L (ref 3.5–5.3)
Sodium: 141 mmol/L (ref 135–146)
Total Bilirubin: 0.4 mg/dL (ref 0.2–1.2)
Total Protein: 7.1 g/dL (ref 6.1–8.1)
eGFR: 83 mL/min/{1.73_m2} (ref >=60)

## 2022-09-03 LAB — LIPID PANEL
Cholesterol: 141 mg/dL (ref <200)
HDL: 47 mg/dL — ABNORMAL LOW (ref >=50)
LDL Cholesterol (Calc): 70 mg/dL
Non-HDL Cholesterol (Calc): 94 mg/dL (ref <130)
Total CHOL/HDL Ratio: 3 (calc) (ref <5.0)
Triglycerides: 154 mg/dL — ABNORMAL HIGH (ref <150)

## 2022-09-03 LAB — IRON,TIBC AND FERRITIN PANEL
%SAT: 15 % — ABNORMAL LOW (ref 16–45)
Ferritin: 37 ng/mL (ref 16–288)
Iron: 53 ug/dL (ref 45–160)
TIBC: 353 ug/dL (ref 250–450)

## 2022-09-03 LAB — TSH: TSH: 2.13 m[IU]/L (ref 0.40–4.50)

## 2022-09-05 ENCOUNTER — Encounter: Payer: Self-pay | Admitting: Family Medicine

## 2022-09-05 ENCOUNTER — Other Ambulatory Visit: Payer: Self-pay | Admitting: Family Medicine

## 2022-09-05 DIAGNOSIS — E890 Postprocedural hypothyroidism: Secondary | ICD-10-CM

## 2022-09-05 DIAGNOSIS — I10 Essential (primary) hypertension: Secondary | ICD-10-CM

## 2022-09-05 DIAGNOSIS — E7800 Pure hypercholesterolemia, unspecified: Secondary | ICD-10-CM

## 2022-09-05 MED ORDER — ATORVASTATIN CALCIUM 80 MG PO TABS
80.0000 mg | ORAL_TABLET | Freq: Every day | ORAL | 3 refills | Status: DC
Start: 2022-09-05 — End: 2023-11-02

## 2022-09-05 MED ORDER — LEVOTHYROXINE SODIUM 175 MCG PO TABS
175.0000 ug | ORAL_TABLET | Freq: Every day | ORAL | 1 refills | Status: DC
Start: 2022-09-05 — End: 2023-03-23

## 2022-09-05 MED ORDER — AMLODIPINE BESY-BENAZEPRIL HCL 10-40 MG PO CAPS
1.0000 | ORAL_CAPSULE | Freq: Every day | ORAL | 1 refills | Status: DC
Start: 2022-09-05 — End: 2023-03-07

## 2022-10-11 ENCOUNTER — Encounter: Payer: Self-pay | Admitting: Family Medicine

## 2022-10-12 ENCOUNTER — Other Ambulatory Visit: Payer: Self-pay | Admitting: Nurse Practitioner

## 2022-10-12 DIAGNOSIS — G2581 Restless legs syndrome: Secondary | ICD-10-CM

## 2022-10-12 MED ORDER — PREGABALIN 25 MG PO CAPS
25.0000 mg | ORAL_CAPSULE | Freq: Every evening | ORAL | 0 refills | Status: DC | PRN
Start: 2022-10-12 — End: 2022-11-12

## 2022-10-15 IMAGING — RF DG HIP (WITH PELVIS) OPERATIVE*R*
1 series · 8 of 8 positions shown · non-contrast
Comparison: December 16, 2010.

CLINICAL DATA: Right hip replacement.

EXAM:
OPERATIVE right HIP (WITH PELVIS IF PERFORMED) 8 VIEWS
TECHNIQUE: Fluoroscopic spot image(s) were submitted for interpretation
post-operatively.
Radiation exposure index: 2.8688 mGy.

[Series 1: unknown protocol · 0.14mm/px · 8 of 8 slices shown]
[im 1/8]
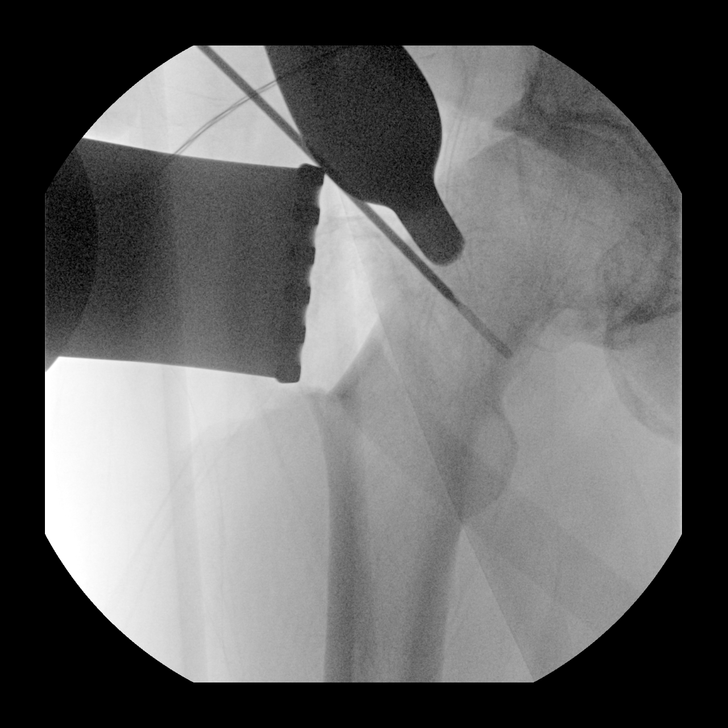
[im 2/8]
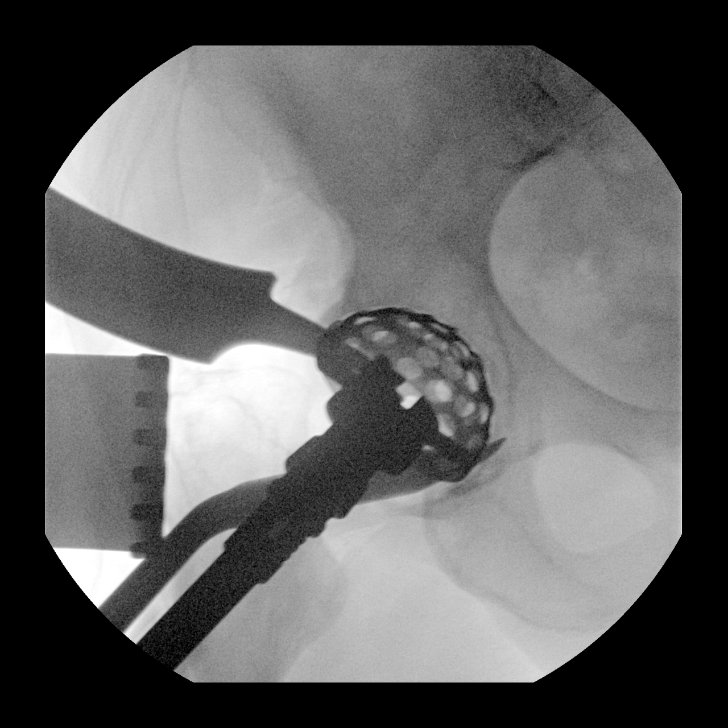
[im 3/8]
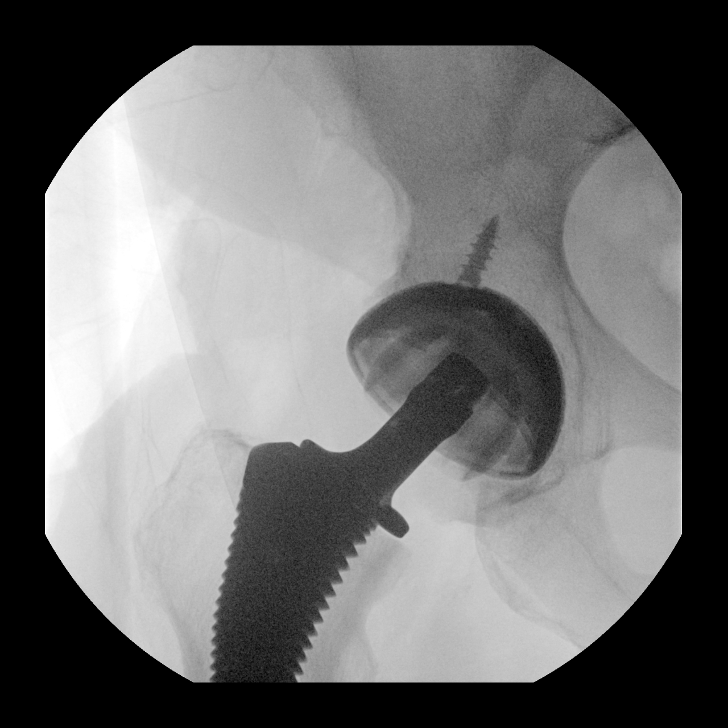
[im 4/8]
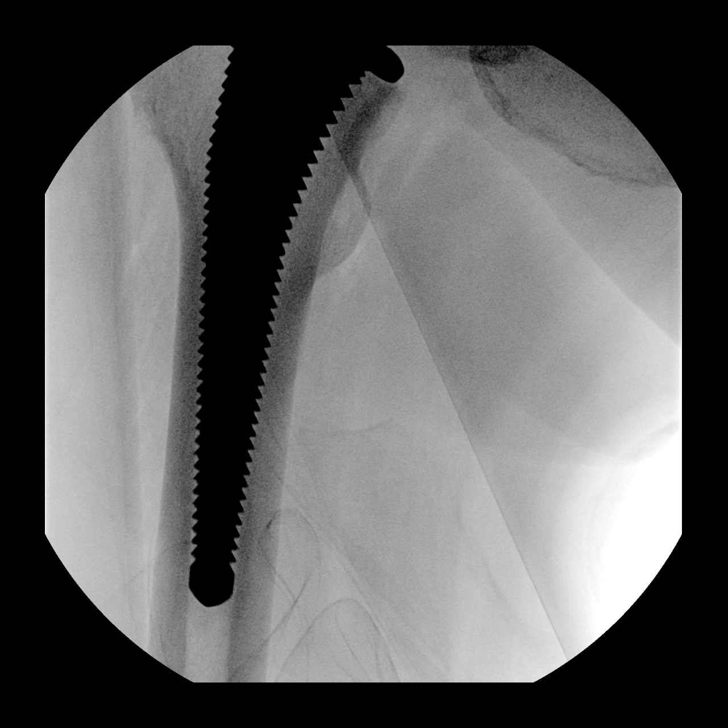
[im 5/8]
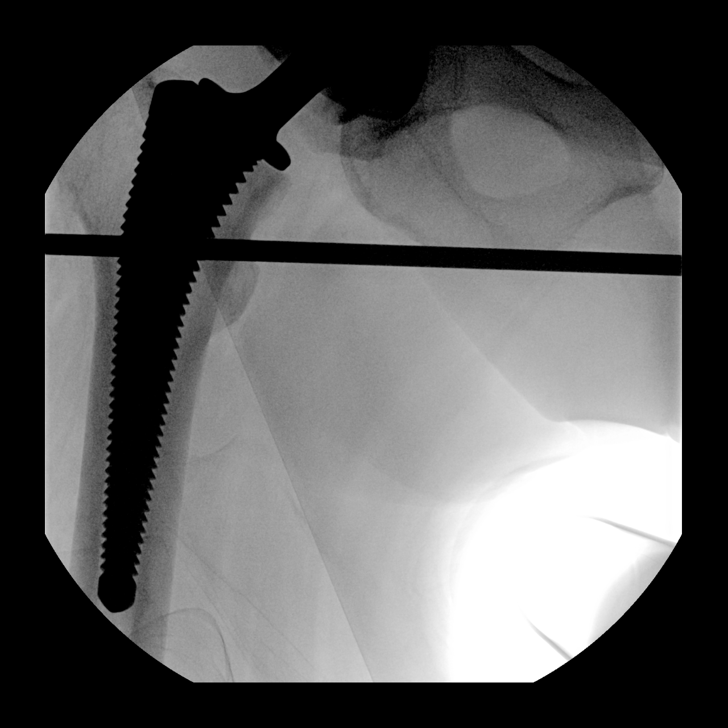
[im 6/8]
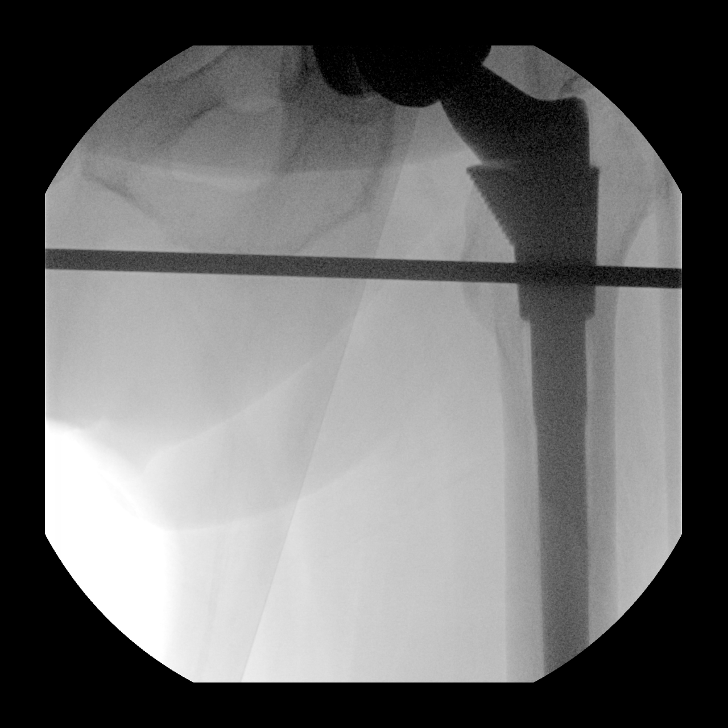
[im 7/8]
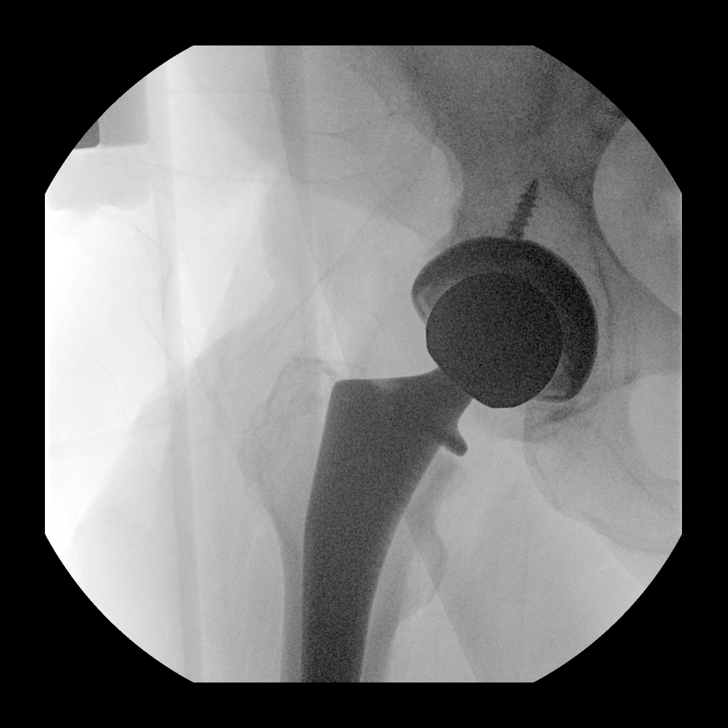
[im 8/8]
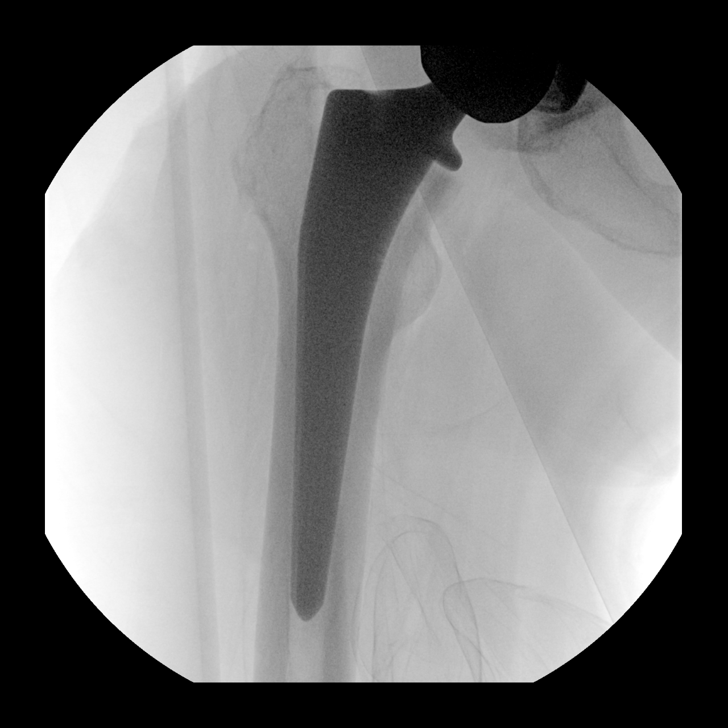

[8 of 8 positions shown; findings below may reference images not displayed]

FINDINGS: Eight intraoperative fluoroscopic images were obtained of the right
hip. The right acetabular and femoral components appear to be well
situated.
IMPRESSION: Status post right total hip arthroplasty.

## 2022-10-28 LAB — HM MAMMOGRAPHY

## 2022-11-11 ENCOUNTER — Other Ambulatory Visit: Payer: Self-pay | Admitting: Nurse Practitioner

## 2022-11-11 DIAGNOSIS — G2581 Restless legs syndrome: Secondary | ICD-10-CM

## 2022-11-12 ENCOUNTER — Other Ambulatory Visit: Payer: Self-pay | Admitting: Family Medicine

## 2022-11-12 DIAGNOSIS — G2581 Restless legs syndrome: Secondary | ICD-10-CM

## 2022-11-12 MED ORDER — PREGABALIN 25 MG PO CAPS: 25.0000 mg | ORAL_CAPSULE | Freq: Every evening | ORAL | 0 refills | Status: DC | PRN

## 2022-11-15 NOTE — Telephone Encounter (Signed)
Lvm to schedule patient a 6 month followup per Dr Carlynn Purl

## 2022-12-27 ENCOUNTER — Other Ambulatory Visit: Payer: Self-pay | Admitting: Family Medicine

## 2022-12-27 DIAGNOSIS — B001 Herpesviral vesicular dermatitis: Secondary | ICD-10-CM

## 2022-12-28 NOTE — Telephone Encounter (Signed)
Rx 02/17/22 #90 [id number]-too soon Requested Prescriptions  Pending Prescriptions Disp Refills   valACYclovir (VALTREX) 1000 MG tablet [Pharmacy Med Name: valACYclovir HCl 1 GM Oral Tablet] 100 tablet 2    Sig: TAKE 1 TABLET BY MOUTH DAILY FOR 5 DAYS AT FIRST SIGN OF COLD  SORE. MAY TAKE 1 TABLET BY MOUTH DAILY FOR PREVENTION     Antimicrobials:  Antiviral Agents - Anti-Herpetic Passed - 12/28/2022  3:02 PM      Passed - Valid encounter within last 12 months    Recent Outpatient Visits           3 months ago Benign essential hypertension   [facility] Danelle Berry, PA-C   10 months ago Hot flash, menopausal   [facility] Danelle Berry, PA-C   1 year ago Hypertension goal BP (blood pressure) < 150/90   [facility] Danelle Berry, PA-C   1 year ago Viral upper respiratory tract infection   [facility] Danelle Berry, PA-C   1 year ago Viral upper respiratory tract infection   [facility] Caro Laroche, DO       Future Appointments             In 2 months Danelle Berry, PA-C [facility]

## 2023-01-18 ENCOUNTER — Other Ambulatory Visit: Payer: Self-pay | Admitting: Family Medicine

## 2023-01-18 DIAGNOSIS — I10 Essential (primary) hypertension: Secondary | ICD-10-CM

## 2023-02-03 ENCOUNTER — Other Ambulatory Visit: Payer: Self-pay | Admitting: Family Medicine

## 2023-02-03 DIAGNOSIS — R232 Flushing: Secondary | ICD-10-CM

## 2023-02-22 ENCOUNTER — Other Ambulatory Visit: Payer: Self-pay | Admitting: Family Medicine

## 2023-02-22 DIAGNOSIS — B001 Herpesviral vesicular dermatitis: Secondary | ICD-10-CM

## 2023-02-25 ENCOUNTER — Other Ambulatory Visit: Payer: Self-pay | Admitting: Family Medicine

## 2023-02-25 DIAGNOSIS — R232 Flushing: Secondary | ICD-10-CM

## 2023-03-07 ENCOUNTER — Ambulatory Visit: Payer: Medicare Other | Admitting: Family Medicine

## 2023-03-07 ENCOUNTER — Encounter: Payer: Self-pay | Admitting: Family Medicine

## 2023-03-07 VITALS — BP 118/70 | HR 92 | Resp 16 | Ht 63.0 in | Wt 200.0 lb

## 2023-03-07 DIAGNOSIS — R232 Flushing: Secondary | ICD-10-CM

## 2023-03-07 DIAGNOSIS — Z6835 Body mass index (BMI) 35.0-35.9, adult: Secondary | ICD-10-CM

## 2023-03-07 DIAGNOSIS — E8881 Metabolic syndrome: Secondary | ICD-10-CM

## 2023-03-07 DIAGNOSIS — I10 Essential (primary) hypertension: Secondary | ICD-10-CM | POA: Diagnosis not present

## 2023-03-07 DIAGNOSIS — E890 Postprocedural hypothyroidism: Secondary | ICD-10-CM

## 2023-03-07 DIAGNOSIS — E7800 Pure hypercholesterolemia, unspecified: Secondary | ICD-10-CM | POA: Diagnosis not present

## 2023-03-07 MED ORDER — PREGABALIN 50 MG PO CAPS: 50.0000 mg | ORAL_CAPSULE | Freq: Three times a day (TID) | ORAL | 1 refills | Status: DC

## 2023-03-07 MED ORDER — AMLODIPINE BESY-BENAZEPRIL HCL 10-40 MG PO CAPS: 1.0000 | ORAL_CAPSULE | Freq: Every day | ORAL | 1 refills | Status: DC

## 2023-03-07 MED ORDER — VENLAFAXINE HCL ER 37.5 MG PO CP24: 37.5000 mg | ORAL_CAPSULE | Freq: Every day | ORAL | 3 refills | Status: AC

## 2023-03-07 NOTE — Patient Instructions (Signed)
 Send me a mychart message when you have about 2 weeks of lyrica left so I can do another prescription.

## 2023-03-07 NOTE — Progress Notes (Unsigned)
 Name: Pamela Dodson   MRN: [id number]    DOB: Sep 24, 1950   Date:03/07/2023       Progress Note  Chief Complaint  Patient presents with   Medical Management of Chronic Issues     Subjective:   Pamela Dodson is a 73 y.o. female, presents to clinic for routine follow up on chronic conditions  Hypertension:  Currently managed on amlodipine-benazepril  Pt reports good med compliance and denies any SE.   Blood pressure today is well controlled. BP Readings from Last 3 Encounters:  03/07/23 118/70  09/02/22 126/72  02/17/22 112/78   Pt denies CP, SOB, exertional sx, LE edema, palpitation, Ha's, visual disturbances, lightheadedness, hypotension, syncope. Dietary efforts for BP?     Hypothyroidism: Current Medication Regimen: 175 mcg synthroid Takes medicine correctly and regularly  Current Symptoms: denies fatigue, weight changes, heat/cold intolerance, bowel/skin changes or CVS symptoms Most recent results are below Lab Results  Component Value Date   TSH 2.13 09/02/2022   T4TOTAL 8.0 08/17/2021   Mood/anxiety Managed with effexor    09/02/2022   11:24 AM 02/17/2022    1:59 PM 08/04/2021    1:27 PM  Depression screen PHQ 2/9  Decreased Interest 0 0 0  Down, Depressed, Hopeless 0 0 0  PHQ - 2 Score 0 0 0  Altered sleeping 0 0 0  Tired, decreased energy 0 0 0  Change in appetite 0 0 0  Feeling bad or failure about yourself  0 0 0  Trouble concentrating 0 0 0  Moving slowly or fidgety/restless 0 0 0  Suicidal thoughts 0 0 0  PHQ-9 Score 0 0 0  Difficult doing work/chores Not difficult at all Not difficult at all Not difficult at all      04/04/2020    1:50 PM 12/11/2019   11:54 AM  GAD 7 : Generalized Anxiety Score  Nervous, Anxious, on Edge 0 0  Control/stop worrying 0 0  Worry too much - different things 0 0  Trouble relaxing 0 0  Restless 0 0  Easily annoyed or irritable 0 0  Afraid - awful might happen 0 0  Total GAD 7 Score 0 0  Anxiety Difficulty Not  difficult at all Not difficult at all          Current Outpatient Medications:    acetaminophen (TYLENOL) 500 MG tablet, Take by mouth., Disp: , Rfl:    amLODipine-benazepril (LOTREL) 10-40 MG capsule, Take 1 capsule by mouth at bedtime., Disp: 100 capsule, Rfl: 1   Ascorbic Acid (VITAMIN C) 100 MG tablet, Take 100 mg by mouth daily., Disp: , Rfl:    atorvastatin (LIPITOR) 80 MG tablet, Take 1 tablet (80 mg total) by mouth daily., Disp: 100 tablet, Rfl: 3   Cholecalciferol 1000 UNITS capsule, Take 1,000 Units by mouth daily., Disp: , Rfl:    Cholecalciferol 25 MCG (1000 UT) capsule, Take by mouth., Disp: , Rfl:    ipratropium-albuterol (DUONEB) 0.5-2.5 (3) MG/3ML SOLN, Take 3 mLs by nebulization every 2 (two) hours as needed., Disp: 360 mL, Rfl: 3   KERENDIA 10 MG TABS, Take 1 tablet by mouth daily., Disp: , Rfl:    levothyroxine (SYNTHROID) 175 MCG tablet, Take 1 tablet (175 mcg total) by mouth daily before breakfast., Disp: 100 tablet, Rfl: 1   loratadine (CLARITIN) 10 MG tablet, Take 10 mg by mouth daily., Disp: , Rfl:    Multiple Vitamin (MULTIVITAMIN) tablet, Take 1 tablet by mouth daily., Disp: , Rfl:  Omega-3 Fatty Acids (FISH OIL) 1000 MG CPDR, Take 1,000 mg by mouth 2 (two) times daily., Disp: , Rfl:    pregabalin (LYRICA) 25 MG capsule, Take 1-2 capsules (25-50 mg total) by mouth at bedtime as needed., Disp: 180 capsule, Rfl: 0   valACYclovir (VALTREX) 1000 MG tablet, TAKE 1 TABLET BY MOUTH DAILY FOR 5 DAYS AT FIRST SIGN OF COLD  SORE. 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   Urine test positive for microalbuminuria 07/15/2017   Prediabetes 07/12/2016   Abnormal mammogram of left breast 07/21/2015   Postsurgical intestinal bypass or anastomosis status    Hypertension goal BP (blood pressure) < 150/90 08/07/2014   Hot flash, menopausal 08/07/2014   Lumbar disc disease with radiculopathy 08/07/2014   Dysmetabolic syndrome 08/07/2014   Class 2 severe obesity with serious comorbidity and body mass index (BMI) of 35.0 to 35.9 in adult (HCC) 08/07/2014   Post-surgical hypothyroidism 08/07/2014   Generalized OA 08/07/2014   Hypercholesterolemia without hypertriglyceridemia 08/07/2014   Hernia of anterior abdominal wall 08/07/2014   Vestibular neuronitis 08/07/2014   Arthritis of hip 12/16/2010    Past Surgical History:  Procedure Laterality Date   APPENDECTOMY     CARPAL TUNNEL RELEASE Left 2016   CARPAL TUNNEL RELEASE Right 05/05/2019   CESAREAN SECTION     X2   CHOLECYSTECTOMY     COLON SURGERY     diverticulitic mass removed AND COLOSTOMY    COLONOSCOPY WITH PROPOFOL N/A 08/15/2014   Procedure: COLONOSCOPY WITH PROPOFOL;  Surgeon: Midge Minium, MD;  Location: [facility];  Service: Endoscopy;  Laterality: N/A;   COLOSTOMY REVERSAL     GANGLION CYST EXCISION     L wrist   GLOMUS TUMOR EXCISION Left 12/12/2017   JOINT REPLACEMENT     knee arthroscopy     THYROIDECTOMY  2009   TOTAL HIP ARTHROPLASTY  12/16/2010   Procedure: TOTAL HIP ARTHROPLASTY;  Surgeon: Nestor Lewandowsky;  Location: [facility];  Service: Orthopedics;  Laterality: Left;  Depuy   TOTAL HIP ARTHROPLASTY Right 01/09/2020   Procedure: TOTAL HIP ARTHROPLASTY ANTERIOR APPROACH;  Surgeon: Lyndle Herrlich, MD;  Location: [facility];  Service: Orthopedics;  Laterality: Right;   TOTAL KNEE ARTHROPLASTY Bilateral 2009    Family History  Problem Relation Age of Onset   Cancer Mother         lung   Myelodysplastic syndrome Mother    Hypothyroidism Mother    Hypertension Brother    Stroke Brother    Atrial fibrillation Brother    Birth defects Son        congenital genetic abnormality   Dementia Maternal Grandfather    Heart disease Paternal Grandmother        heart disease in her 61's   Stroke Paternal Grandmother    Heart attack Paternal Grandfather    Heart disease Paternal Aunt    Heart attack Paternal Aunt    Stroke Paternal Aunt  Atrial fibrillation Paternal Uncle     Social History   Tobacco Use   Smoking status: Never   Smokeless tobacco: Never  Vaping Use   Vaping status: Never Used  Substance Use Topics   Alcohol use: Yes    Alcohol/week: 1.0 standard drink of alcohol    Types: 1 Glasses of wine per week    Comment: occasional   Drug use: No     Allergies  Allergen Reactions   Toradol [Ketorolac Tromethamine] Other (See Comments)    Intolerance to NSAIDs due to CKD    Health Maintenance  Topic Date Due   Medicare Annual Wellness (AWV)  02/18/2023   COVID-19 Vaccine (3 - Pfizer risk series) 03/20/2023 (Originally 03/15/2019)   Zoster Vaccines- Shingrix (2 of 2) 06/01/2023 (Originally 11/29/2022)   DEXA SCAN  03/03/2024 (Originally 10/15/2022)   MAMMOGRAM  10/28/2023   DTaP/Tdap/Td (3 - Td or Tdap) 08/04/2024   Colonoscopy  08/14/2024   Pneumonia Vaccine 24+ Years old  Completed   INFLUENZA VACCINE  Completed   Hepatitis C Screening  Completed   HPV VACCINES  Aged Out    Chart Review Today: I personally reviewed active problem list, medication list, allergies, family history, social history, health maintenance, notes from last encounter, lab results, imaging with the patient/caregiver today.   Review of Systems  Constitutional: Negative.   HENT: Negative.    Eyes: Negative.   Respiratory: Negative.    Cardiovascular: Negative.   Gastrointestinal: Negative.   Endocrine: Negative.   Genitourinary: Negative.    Musculoskeletal: Negative.   Skin: Negative.   Allergic/Immunologic: Negative.   Neurological: Negative.   Hematological: Negative.   Psychiatric/Behavioral: Negative.    All other systems reviewed and are negative.    Objective:   Vitals:   03/07/23 1418  BP: 118/70  Pulse: 92  Resp: 16  SpO2: 99%  Weight: 200 lb (90.7 kg)  Height: 5\' 3"  (1.6 m)    Body mass index is 35.43 kg/m.  Physical Exam Vitals and nursing note reviewed.  Constitutional:      General: She is not in acute distress.    Appearance: Normal appearance. She is well-developed and well-groomed. She is not ill-appearing, toxic-appearing or diaphoretic.     Interventions: Face mask in place.  HENT:     Head: Normocephalic and atraumatic.  Eyes:     General: No scleral icterus.       Right eye: No discharge.        Left eye: No discharge.     Conjunctiva/sclera: Conjunctivae normal.  Neck:     Trachea: Phonation normal. No tracheal deviation.  Cardiovascular:     Rate and Rhythm: Normal rate and regular rhythm.     Pulses: Normal pulses.     Heart sounds: Normal heart sounds. No murmur heard.    No friction rub. No gallop.  Pulmonary:     Effort: Pulmonary effort is normal. No respiratory distress.     Breath sounds: Normal breath sounds. No stridor. No wheezing or rales.  Musculoskeletal:     Right lower leg: No edema.     Left lower leg: No edema.  Skin:    General: Skin is warm and dry.     Findings: No rash.  Neurological:     Mental Status: She is alert.     Motor: No abnormal muscle tone.     Coordination: Coordination normal.     Gait: Gait normal.  Psychiatric:  Mood and Affect: Mood normal.        Behavior: Behavior normal. Behavior is cooperative.      Functional Status Survey:   Results for orders placed or performed in visit on 03/04/23  HM MAMMOGRAPHY   Collection Time: 10/28/22 12:00 AM  Result Value Ref Range   HM Mammogram 0-4 Bi-Rad 0-4 Bi-Rad, Self Reported  Normal      Assessment & Plan:   Primary hypertension Assessment & Plan: Well controlled, compliant with meds, no SE or concerning sx Reviewed recent/last renal function and electrolytes Pt encouraged to continue to work on healthy diet (low salt) and lifestyle for improving HTN management - DASH diet handout offered today No med changes - refilled amlodipine-benazepril 10-40  Orders: -     amLODIPine Besy-Benazepril HCl; Take 1 capsule by mouth at bedtime.  Dispense: 100 capsule; Refill: 1  Class 2 severe obesity due to excess calories with serious comorbidity and body mass index (BMI) of 35.0 to 35.9 in adult [facility]) Assessment & Plan: Reviewed weights, fairly stable Wt Readings from Last 5 Encounters:  03/07/23 200 lb (90.7 kg)  09/02/22 199 lb 4.8 oz (90.4 kg)  02/17/22 195 lb 6.4 oz (88.6 kg)  08/04/21 198 lb 8 oz (90 kg)  01/06/21 204 lb (92.5 kg)   BMI Readings from Last 5 Encounters:  03/07/23 35.43 kg/m  09/02/22 35.30 kg/m  02/17/22 34.61 kg/m  08/04/21 35.16 kg/m  01/06/21 36.14 kg/m   With multiple associated comorbidities HTN, HLD, CKD stage 3 and hypothyroid   Post-surgical hypothyroidism Assessment & Plan: Euthyroid sx Stable and steady dosing and med compliance Can monitor TSH annually, no med changes, continue levothyroxine 175 mcg daily    Hypercholesterolemia without hypertriglyceridemia Assessment & Plan: Compliant with meds, no SE, no myalgias, fatigue or jaundice Reviewed last lipids and LFTs Lab Results  Component Value Date   CHOL 141 09/02/2022   HDL 47 (L) 09/02/2022   LDLCALC 70 09/02/2022   TRIG 154 (H) 09/02/2022   CHOLHDL 3.0 09/02/2022  Continue lipitor 80 and continue diet and lifestyle efforts, will be due for labs an next f/up visit (6 months)    Hot flashes -     Venlafaxine HCl ER; Take 1 capsule (37.5 mg total) by mouth daily with breakfast.  Dispense: 100 capsule; Refill: 3  Other orders -     Pregabalin; Take 1  capsule (50 mg total) by mouth 3 (three) times daily.  Dispense: 100 capsule; Refill: 1     Return in about 6 months (around 09/07/2023) for Routine follow-up.   Danelle Berry, PA-C 03/07/23 2:43 PM

## 2023-03-10 ENCOUNTER — Encounter: Payer: Self-pay | Admitting: Family Medicine

## 2023-03-10 DIAGNOSIS — I10 Essential (primary) hypertension: Secondary | ICD-10-CM | POA: Insufficient documentation

## 2023-03-10 NOTE — Assessment & Plan Note (Signed)
 Compliant with meds, no SE, no myalgias, fatigue or jaundice Reviewed last lipids and LFTs Lab Results  Component Value Date   CHOL 141 09/02/2022   HDL 47 (L) 09/02/2022   LDLCALC 70 09/02/2022   TRIG 154 (H) 09/02/2022   CHOLHDL 3.0 09/02/2022  Continue lipitor 80 and continue diet and lifestyle efforts, will be due for labs an next f/up visit (6 months)

## 2023-03-10 NOTE — Assessment & Plan Note (Signed)
 Reviewed weights, fairly stable Wt Readings from Last 5 Encounters:  03/07/23 200 lb (90.7 kg)  09/02/22 199 lb 4.8 oz (90.4 kg)  02/17/22 195 lb 6.4 oz (88.6 kg)  08/04/21 198 lb 8 oz (90 kg)  01/06/21 204 lb (92.5 kg)   BMI Readings from Last 5 Encounters:  03/07/23 35.43 kg/m  09/02/22 35.30 kg/m  02/17/22 34.61 kg/m  08/04/21 35.16 kg/m  01/06/21 36.14 kg/m   With multiple associated comorbidities HTN, HLD, CKD stage 3 and hypothyroid

## 2023-03-10 NOTE — Assessment & Plan Note (Deleted)
 Well controlled, compliant with meds, no SE or concerning sx Reviewed recent/last renal function and electrolytes Pt encouraged to continue to work on healthy diet (low salt) and lifestyle for improving HTN management - DASH diet handout offered today No med changes - refilled amlodipine-benazepril 10-40

## 2023-03-10 NOTE — Assessment & Plan Note (Signed)
 Euthyroid sx Stable and steady dosing and med compliance Can monitor TSH annually, no med changes, continue levothyroxine 175 mcg daily

## 2023-03-10 NOTE — Assessment & Plan Note (Signed)
 Well controlled, compliant with meds, no SE or concerning sx Reviewed recent/last renal function and electrolytes Pt encouraged to continue to work on healthy diet (low salt) and lifestyle for improving HTN management - DASH diet handout offered today No med changes - refilled amlodipine-benazepril 10-40

## 2023-03-14 ENCOUNTER — Other Ambulatory Visit: Payer: Self-pay | Admitting: Family Medicine

## 2023-03-14 DIAGNOSIS — B001 Herpesviral vesicular dermatitis: Secondary | ICD-10-CM

## 2023-03-22 ENCOUNTER — Other Ambulatory Visit: Payer: Self-pay | Admitting: Family Medicine

## 2023-03-22 DIAGNOSIS — E890 Postprocedural hypothyroidism: Secondary | ICD-10-CM

## 2023-05-09 ENCOUNTER — Other Ambulatory Visit: Payer: Self-pay | Admitting: Family Medicine

## 2023-05-09 DIAGNOSIS — B001 Herpesviral vesicular dermatitis: Secondary | ICD-10-CM

## 2023-05-11 NOTE — Telephone Encounter (Signed)
 Requested Prescriptions  Pending Prescriptions Disp Refills   valACYclovir  (VALTREX ) 1000 MG tablet [Pharmacy Med Name: valACYclovir  HCl 1 GM Oral Tablet] 20 tablet 2    Sig: TAKE 1 TABLET BY MOUTH DAILY FOR 5 DAYS AT FIRST SIGN OF COLD  SORE. MAY TAKE 1 TABLET BY MOUTH DAILY FOR PREVENTION.     Antimicrobials:  Antiviral Agents - Anti-Herpetic Passed - 05/11/2023  1:42 PM      Passed - Valid encounter within last 12 months    Recent Outpatient Visits           2 months ago Primary hypertension   [place] [facility] Adeline Hone, PA-C       Future Appointments             In 4 months Adeline Hone, PA-C [facility], [facility]

## 2023-06-07 ENCOUNTER — Other Ambulatory Visit: Payer: Self-pay | Admitting: Family Medicine

## 2023-06-07 DIAGNOSIS — B001 Herpesviral vesicular dermatitis: Secondary | ICD-10-CM

## 2023-06-09 NOTE — Telephone Encounter (Signed)
 Too soon for refill, refilled 05/11/23 for 20 and 2 RF.  Requested Prescriptions  Pending Prescriptions Disp Refills   valACYclovir  (VALTREX ) 1000 MG tablet [Pharmacy Med Name: valACYclovir  HCl 1 GM Oral Tablet] 60 tablet 5    Sig: TAKE 1 TABLET BY MOUTH DAILY FOR 5 DAYS AT FIRST SIGN OF COLD  SORE. MAY TAKE 1 TABLET BY MOUTH DAILY FOR PREVENTION     Antimicrobials:  Antiviral Agents - Anti-Herpetic Passed - 06/09/2023  8:13 AM      Passed - Valid encounter within last 12 months    Recent Outpatient Visits           3 months ago Primary hypertension   [place] [facility] Adeline Hone, PA-C       Future Appointments             In 3 months Adeline Hone, PA-C [facility], [facility]

## 2023-08-04 ENCOUNTER — Ambulatory Visit

## 2023-08-05 ENCOUNTER — Ambulatory Visit

## 2023-08-07 ENCOUNTER — Other Ambulatory Visit: Payer: Self-pay | Admitting: Family Medicine

## 2023-08-07 DIAGNOSIS — B001 Herpesviral vesicular dermatitis: Secondary | ICD-10-CM

## 2023-08-08 ENCOUNTER — Other Ambulatory Visit: Payer: Self-pay

## 2023-08-08 ENCOUNTER — Encounter: Payer: Self-pay | Admitting: Family Medicine

## 2023-08-08 DIAGNOSIS — B001 Herpesviral vesicular dermatitis: Secondary | ICD-10-CM

## 2023-08-08 MED ORDER — VALACYCLOVIR HCL 1 G PO TABS: ORAL_TABLET | ORAL | 2 refills | Status: DC

## 2023-08-12 ENCOUNTER — Other Ambulatory Visit: Payer: Self-pay | Admitting: Family Medicine

## 2023-08-12 DIAGNOSIS — I10 Essential (primary) hypertension: Secondary | ICD-10-CM

## 2023-08-16 NOTE — Telephone Encounter (Signed)
 Requested medication (s) are due for refill today: yes  Requested medication (s) are on the active medication list: yes  Last refill:  03/07/23 #100 1 RF  Future visit scheduled: yes  Notes to clinic:  overdue lab work    Requested Prescriptions  Pending Prescriptions Disp Refills   amLODipine -benazepril  (LOTREL) 10-40 MG capsule [Pharmacy Med Name: amLODIPine  Besy-Benazepril  HCl 10-40 MG Oral Capsule] 100 capsule 2    Sig: Take 1 capsule by mouth at bedtime.     Cardiovascular: CCB + ACEI Combos Failed - 08/16/2023  3:16 PM      Failed - Cr in normal range and within 180 days    Creat  Date Value Ref Range Status  09/02/2022 0.76 0.60 - 1.00 mg/dL Final   Creatinine, Urine  Date Value Ref Range Status  07/14/2017 163 20 - 275 mg/dL Final         Failed - K in normal range and within 180 days    Potassium  Date Value Ref Range Status  09/02/2022 4.2 3.5 - 5.3 mmol/L Final         Failed - Na in normal range and within 180 days    Sodium  Date Value Ref Range Status  09/02/2022 141 135 - 146 mmol/L Final  08/14/2014 144 134 - 144 mmol/L Final         Failed - eGFR is 30 or above and within 180 days    GFR, Est African American  Date Value Ref Range Status  03/09/2019 108 > OR = 60 mL/min/1.58m2 Final   GFR calc Af Amer  Date Value Ref Range Status  03/17/2019 >60 >60 mL/min Final   GFR, Est Non African American  Date Value Ref Range Status  03/09/2019 93 > OR = 60 mL/min/1.46m2 Final   GFR, Estimated  Date Value Ref Range Status  01/10/2020 >60 >60 mL/min Final    Comment:    (NOTE) Calculated using the CKD-EPI Creatinine Equation (2021)    eGFR  Date Value Ref Range Status  09/02/2022 83 > OR = 60 mL/min/1.76m2 Final         Passed - Patient is not pregnant      Passed - Last BP in normal range    BP Readings from Last 1 Encounters:  03/07/23 118/70         Passed - Valid encounter within last 6 months    Recent Outpatient Visits           5  months ago Primary hypertension   [place] [facility] Leavy Mole, PA-C       Future Appointments             In 4 weeks Leavy Mole, PA-C [facility], [facility]

## 2023-08-29 ENCOUNTER — Other Ambulatory Visit: Payer: Self-pay | Admitting: Medical Genetics

## 2023-09-02 ENCOUNTER — Ambulatory Visit

## 2023-09-03 ENCOUNTER — Other Ambulatory Visit: Payer: Self-pay | Admitting: Nurse Practitioner

## 2023-09-03 DIAGNOSIS — B001 Herpesviral vesicular dermatitis: Secondary | ICD-10-CM

## 2023-09-06 NOTE — Telephone Encounter (Signed)
 Too soon for refill, LRF 08/08/23.  Requested Prescriptions  Pending Prescriptions Disp Refills   valACYclovir  (VALTREX ) 1000 MG tablet [Pharmacy Med Name: valACYclovir  HCl 1 GM Oral Tablet] 60 tablet 5    Sig: TAKE 1 TABLET BY MOUTH DAILY FOR 5 DAYS AT FIRST SIGN OF COLD  SORE. MAY TAKE 1 TABLET BY MOUTH DAILY FOR PREVENTION     Antimicrobials:  Antiviral Agents - Anti-Herpetic Passed - 09/06/2023 11:21 AM      Passed - Valid encounter within last 12 months    Recent Outpatient Visits           6 months ago Primary hypertension   [place] [facility] Leavy Mole, PA-C       Future Appointments             In 1 week Leavy Mole, PA-C [facility] [place]

## 2023-09-09 ENCOUNTER — Other Ambulatory Visit
Admission: RE | Admit: 2023-09-09 | Discharge: 2023-09-09 | Disposition: A | Discharge status: Home / Self Care | Payer: Self-pay | Source: Ambulatory Visit | Attending: Medical Genetics | Admitting: Medical Genetics

## 2023-09-13 ENCOUNTER — Ambulatory Visit: Admitting: Family Medicine

## 2023-09-13 DIAGNOSIS — I10 Essential (primary) hypertension: Secondary | ICD-10-CM

## 2023-09-13 DIAGNOSIS — I700 Atherosclerosis of aorta: Secondary | ICD-10-CM

## 2023-09-13 DIAGNOSIS — E890 Postprocedural hypothyroidism: Secondary | ICD-10-CM

## 2023-09-13 DIAGNOSIS — E7800 Pure hypercholesterolemia, unspecified: Secondary | ICD-10-CM

## 2023-09-13 DIAGNOSIS — E66812 Obesity, class 2: Secondary | ICD-10-CM

## 2023-09-14 ENCOUNTER — Encounter: Payer: Self-pay | Admitting: Family Medicine

## 2023-09-14 ENCOUNTER — Ambulatory Visit: Admitting: Family Medicine

## 2023-09-14 VITALS — BP 122/78 | HR 74 | Resp 16 | Ht 63.0 in | Wt 208.0 lb

## 2023-09-14 DIAGNOSIS — R7303 Prediabetes: Secondary | ICD-10-CM

## 2023-09-14 DIAGNOSIS — G2581 Restless legs syndrome: Secondary | ICD-10-CM

## 2023-09-14 DIAGNOSIS — I700 Atherosclerosis of aorta: Secondary | ICD-10-CM

## 2023-09-14 DIAGNOSIS — R5383 Other fatigue: Secondary | ICD-10-CM

## 2023-09-14 DIAGNOSIS — E7800 Pure hypercholesterolemia, unspecified: Secondary | ICD-10-CM

## 2023-09-14 DIAGNOSIS — E66812 Obesity, class 2: Secondary | ICD-10-CM

## 2023-09-14 DIAGNOSIS — E890 Postprocedural hypothyroidism: Secondary | ICD-10-CM

## 2023-09-14 DIAGNOSIS — Z5181 Encounter for therapeutic drug level monitoring: Secondary | ICD-10-CM | POA: Diagnosis not present

## 2023-09-14 DIAGNOSIS — Z6836 Body mass index (BMI) 36.0-36.9, adult: Secondary | ICD-10-CM

## 2023-09-14 DIAGNOSIS — I10 Essential (primary) hypertension: Secondary | ICD-10-CM

## 2023-09-14 DIAGNOSIS — G4709 Other insomnia: Secondary | ICD-10-CM

## 2023-09-14 DIAGNOSIS — M5116 Intervertebral disc disorders with radiculopathy, lumbar region: Secondary | ICD-10-CM

## 2023-09-14 MED ORDER — PREGABALIN 50 MG PO CAPS: 50.0000 mg | ORAL_CAPSULE | Freq: Three times a day (TID) | ORAL | 1 refills | Status: DC

## 2023-09-14 NOTE — Assessment & Plan Note (Signed)
 Compliant with meds, no SE, no myalgias, fatigue or jaundice Reviewed last lipids and LFTs Lab Results  Component Value Date   CHOL 141 09/02/2022   HDL 47 (L) 09/02/2022   LDLCALC 70 09/02/2022   TRIG 154 (H) 09/02/2022   CHOLHDL 3.0 09/02/2022  Continue lipitor 80 and continue diet and lifestyle efforts, will be due for labs an next f/up visit (6 months) Due for annual labs today

## 2023-09-14 NOTE — Assessment & Plan Note (Addendum)
 Lab Results  Component Value Date   HGBA1C 5.6 08/05/2021   Last A1C was in normal range - will check glucose and A1C

## 2023-09-14 NOTE — Assessment & Plan Note (Signed)
 Reviewed weights, fairly stable Wt Readings from Last 5 Encounters:  09/14/23 208 lb (94.3 kg)  03/07/23 200 lb (90.7 kg)  09/02/22 199 lb 4.8 oz (90.4 kg)  02/17/22 195 lb 6.4 oz (88.6 kg)  08/04/21 198 lb 8 oz (90 kg)   BMI Readings from Last 5 Encounters:  09/14/23 36.85 kg/m  03/07/23 35.43 kg/m  09/02/22 35.30 kg/m  02/17/22 34.61 kg/m  08/04/21 35.16 kg/m   With multiple associated comorbidities HTN, HLD, CKD stage 3 and hypothyroid

## 2023-09-14 NOTE — Assessment & Plan Note (Signed)
 Well controlled, compliant with meds, no SE or concerning sx Reviewed recent/last renal function and electrolytes Pt encouraged to continue to work on healthy diet (low salt) and lifestyle for improving HTN management - DASH diet handout offered today No med changes - refilled amlodipine -benazepril  10-40 BP Readings from Last 3 Encounters:  09/14/23 122/78  03/07/23 118/70  09/02/22 126/72

## 2023-09-14 NOTE — Progress Notes (Signed)
 Name: Pamela Dodson   MRN: [id number]    DOB: 1950-06-01   Date:09/14/2023       Progress Note  Chief Complaint  Patient presents with   Medical Management of Chronic Issues    6 month follow-up     Subjective:   Pamela Dodson is a 73 y.o. female, presents to clinic for routine follow up on chronic conditions  Hypertension:  Currently managed on amlodipine -benazepril   Pt reports good med compliance and denies any SE.   Blood pressure today is well controlled. BP Readings from Last 3 Encounters:  09/14/23 122/78  03/07/23 118/70  09/02/22 126/72   Pt denies CP, SOB, exertional sx, LE edema, palpitation, Ha's, visual disturbances, lightheadedness, hypotension, syncope. Dietary efforts for BP?     Hypothyroidism: Current Medication Regimen: 175 mcg synthroid  Takes medicine correctly and regularly  Current Symptoms: denies fatigue, weight changes, heat/cold intolerance, bowel/skin changes or CVS symptoms Most recent results are below Lab Results  Component Value Date   TSH 2.13 09/02/2022   T4TOTAL 8.0 08/17/2021   Mood/anxiety Managed with effexor     09/14/2023    2:06 PM 09/02/2022   11:24 AM 02/17/2022    1:59 PM  Depression screen PHQ 2/9  Decreased Interest 0 0 0  Down, Depressed, Hopeless 0 0 0  PHQ - 2 Score 0 0 0  Altered sleeping  0 0  Tired, decreased energy  0 0  Change in appetite  0 0  Feeling bad or failure about yourself   0 0  Trouble concentrating  0 0  Moving slowly or fidgety/restless  0 0  Suicidal thoughts  0 0  PHQ-9 Score  0 0  Difficult doing work/chores  Not difficult at all Not difficult at all      04/04/2020    1:50 PM 12/11/2019   11:54 AM  GAD 7 : Generalized Anxiety Score  Nervous, Anxious, on Edge 0 0  Control/stop worrying 0 0  Worry too much - different things 0 0  Trouble relaxing 0 0  Restless 0 0  Easily annoyed or irritable 0 0  Afraid - awful might happen 0 0  Total GAD 7 Score 0 0  Anxiety Difficulty Not  difficult at all Not difficult at all     Only new concern is fatigue/energy/weight gain  Discussed the use of AI scribe software for clinical note transcription with the patient, who gave verbal consent to proceed.  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  Sleep disturbance - Sleep disrupted, waking every 2 to 2.5 hours - Frequent awakenings sometimes due to nocturia - Uses melatonin and chamomile tea to aid sleep, but continues to wake frequently - No prior sleep study  Dyspnea - Feels winded and has difficulty breathing deeply - Symptoms particularly noticeable during recent trip to [place] at higher elevation - Dyspnea has been more frequent this year compared to previous years  Musculoskeletal symptoms - History of arthritis in hands, previously evaluated at [facility] with prior injection for relief - Presence of artificial joints - Sensations in knees affecting ability to walk long  distances  Night sweats - No persistent night sweats - Experienced a couple of night sweats recently, attributed to recent steroid use  Weight gain - Weight gain despite no changes in diet - Concerned about weight gain       Current Outpatient Medications:    acetaminophen  (TYLENOL ) 500 MG tablet, Take by mouth., Disp: , Rfl:    amLODipine -benazepril  (LOTREL) 10-40 MG capsule, TAKE 1 CAPSULE BY MOUTH AT  BEDTIME, Disp: 90 capsule, Rfl: 1   Ascorbic Acid (VITAMIN C) 100 MG tablet, Take 100 mg by mouth daily., Disp: , Rfl:    atorvastatin  (LIPITOR) 80 MG tablet, Take 1 tablet (80 mg total) by mouth  daily., Disp: 100 tablet, Rfl: 3   Cholecalciferol 1000 UNITS capsule, Take 1,000 Units by mouth daily., Disp: , Rfl:    Cholecalciferol 25 MCG (1000 UT) capsule, Take by mouth., Disp: , Rfl:    ipratropium-albuterol  (DUONEB) 0.5-2.5 (3) MG/3ML SOLN, Take 3 mLs by nebulization every 2 (two) hours as needed., Disp: 360 mL, Rfl: 3   KERENDIA 10 MG TABS, Take 1 tablet by mouth daily., Disp: , Rfl:    levothyroxine  (SYNTHROID ) 175 MCG tablet, TAKE 1 TABLET BY MOUTH DAILY  BEFORE BREAKFAST, Disp: 100 tablet, Rfl: 2   loratadine  (CLARITIN ) 10 MG tablet, Take 10 mg by mouth daily., Disp: , Rfl:    Multiple Vitamin (MULTIVITAMIN) tablet, Take 1 tablet by mouth daily., Disp: , Rfl:    Omega-3 Fatty Acids (FISH OIL) 1000 MG CPDR, Take 1,000 mg by mouth 2 (two) times daily., Disp: , Rfl:    pregabalin  (LYRICA ) 50 MG capsule, Take 1 capsule (50 mg total) by mouth 3 (three) times daily., Disp: 100 capsule, Rfl: 1   valACYclovir  (VALTREX ) 1000 MG tablet, TAKE 1 TABLET BY MOUTH  DAILY FOR 5 DAYS AT FIRST  SIGN OF COLD SORE. 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   Urine test positive for microalbuminuria 07/15/2017   Prediabetes 07/12/2016   Abnormal mammogram of left breast 07/21/2015   Postsurgical intestinal bypass or anastomosis status    Hypertension goal BP (blood pressure) < 150/90 08/07/2014   Hot flash, menopausal 08/07/2014   Lumbar disc  disease with radiculopathy 08/07/2014   Dysmetabolic syndrome 08/07/2014   Class 2 severe obesity with serious comorbidity and body mass index (BMI) of 35.0 to 35.9 in adult [facility]) 08/07/2014   Post-surgical hypothyroidism 08/07/2014   Generalized OA 08/07/2014   Hypercholesterolemia without hypertriglyceridemia 08/07/2014   Hernia of anterior abdominal wall 08/07/2014   Vestibular neuronitis 08/07/2014   Arthritis of hip 12/16/2010    Past Surgical History:  Procedure Laterality Date   APPENDECTOMY     CARPAL TUNNEL RELEASE Left 2016   CARPAL TUNNEL RELEASE Right 05/05/2019   CESAREAN SECTION     X2   CHOLECYSTECTOMY     COLON SURGERY     diverticulitic mass removed AND COLOSTOMY    COLONOSCOPY WITH  PROPOFOL  N/A 08/15/2014   Procedure: COLONOSCOPY WITH PROPOFOL ;  Surgeon: Rogelia Copping, MD;  Location: [facility];  Service: Endoscopy;  Laterality: N/A;   COLOSTOMY REVERSAL     GANGLION CYST EXCISION     L wrist   GLOMUS TUMOR EXCISION Left 12/12/2017   JOINT REPLACEMENT     knee arthroscopy     THYROIDECTOMY  2009   TOTAL HIP ARTHROPLASTY  12/16/2010   Procedure: TOTAL HIP ARTHROPLASTY;  Surgeon: Dempsey JINNY Sensor;  Location: [facility];  Service: Orthopedics;  Laterality: Left;  Depuy   TOTAL HIP ARTHROPLASTY Right 01/09/2020   Procedure: TOTAL HIP ARTHROPLASTY ANTERIOR APPROACH;  Surgeon: Leora Lynwood SAUNDERS, MD;  Location: [facility];  Service: Orthopedics;  Laterality: Right;   TOTAL KNEE ARTHROPLASTY Bilateral 2009    Family History  Problem Relation Age of Onset   Cancer Mother        lung   Myelodysplastic syndrome Mother    Hypothyroidism Mother    Hypertension Brother    Stroke Brother    Atrial fibrillation Brother    Birth defects Son        congenital genetic abnormality   Dementia Maternal Grandfather    Heart disease Paternal Grandmother        heart disease in her 54's   Stroke Paternal Grandmother    Heart attack Paternal Grandfather    Heart disease Paternal  Aunt    Heart attack Paternal Aunt    Stroke Paternal Aunt    Atrial fibrillation Paternal Uncle     Social History   Tobacco Use   Smoking status: Never   Smokeless tobacco: Never  Vaping Use   Vaping status: Never Used  Substance Use Topics   Alcohol use: Yes    Alcohol/week: 1.0 standard drink of alcohol    Types: 1 Glasses of wine per week    Comment: occasional   Drug use: No     Allergies  Allergen Reactions   Toradol [Ketorolac Tromethamine] Other (See Comments)    Intolerance to NSAIDs due to CKD    Health Maintenance  Topic Date Due   Medicare Annual Wellness (AWV)  02/18/2023   Influenza Vaccine  08/05/2023   COVID-19 Vaccine (7 - 2025-26 season) 09/28/2023 (Originally 09/05/2023)   DEXA SCAN  03/03/2024 (Originally 10/15/2022)   MAMMOGRAM  10/28/2023   Colonoscopy  08/14/2024   DTaP/Tdap/Td (5 - Td or Tdap) 09/01/2024   Pneumococcal Vaccine: 50+ Years  Completed   Hepatitis C Screening  Completed   Zoster Vaccines- Shingrix  Completed   HPV VACCINES  Aged Out   Meningococcal B Vaccine  Aged Out    Chart Review Today: I personally reviewed active problem list, medication list, allergies, family history, social history, health maintenance, notes from last encounter, lab results, imaging with the patient/caregiver today.   Review of Systems  Constitutional: Negative.   HENT: Negative.    Eyes: Negative.   Respiratory: Negative.    Cardiovascular: Negative.   Gastrointestinal: Negative.   Endocrine: Negative.   Genitourinary: Negative.   Musculoskeletal: Negative.   Skin: Negative.   Allergic/Immunologic: Negative.   Neurological: Negative.   Hematological: Negative.   Psychiatric/Behavioral: Negative.    All other systems reviewed and are negative.    Objective:   Vitals:   09/14/23 1406  BP: 122/78  Pulse: 74  Resp: 16  SpO2: 96%  Weight: 208 lb (94.3 kg)  Height: 5' 3 (1.6 m)    Body mass index is 36.85  kg/m.  Physical  Exam Vitals and nursing note reviewed.  Constitutional:      General: She is not in acute distress.    Appearance: Normal appearance. She is well-developed and well-groomed. She is not ill-appearing, toxic-appearing or diaphoretic.     Interventions: Face mask in place.  HENT:     Head: Normocephalic and atraumatic.  Eyes:     General: No scleral icterus.       Right eye: No discharge.        Left eye: No discharge.     Conjunctiva/sclera: Conjunctivae normal.  Neck:     Trachea: Phonation normal. No tracheal deviation.  Cardiovascular:     Rate and Rhythm: Normal rate and regular rhythm.     Pulses: Normal pulses.     Heart sounds: Normal heart sounds. No murmur heard.    No friction rub. No gallop.  Pulmonary:     Effort: Pulmonary effort is normal. No respiratory distress.     Breath sounds: Normal breath sounds. No stridor. No wheezing or rales.  Musculoskeletal:     Right lower leg: No edema.     Left lower leg: No edema.  Skin:    General: Skin is warm and dry.     Findings: No rash.  Neurological:     Mental Status: She is alert.     Motor: No abnormal muscle tone.     Coordination: Coordination normal.     Gait: Gait normal.  Psychiatric:        Mood and Affect: Mood normal.        Behavior: Behavior normal. Behavior is cooperative.       Results for orders placed or performed in visit on 03/04/23  HM MAMMOGRAPHY   Collection Time: 10/28/22 12:00 AM  Result Value Ref Range   HM Mammogram 0-4 Bi-Rad 0-4 Bi-Rad, Self Reported Normal      Assessment & Plan:   Primary hypertension Assessment & Plan: Well controlled, compliant with meds, no SE or concerning sx Reviewed recent/last renal function and electrolytes Pt encouraged to continue to work on healthy diet (low salt) and lifestyle for improving HTN management - DASH diet handout offered today No med changes - refilled amlodipine -benazepril  10-40 BP Readings from Last 3 Encounters:  09/14/23 122/78   03/07/23 118/70  09/02/22 126/72     Orders: -     Comprehensive metabolic panel with GFR  Aortic atherosclerosis (HCC) -     Comprehensive metabolic panel with GFR -     Lipid panel  Hypercholesterolemia without hypertriglyceridemia Assessment & Plan: Compliant with meds, no SE, no myalgias, fatigue or jaundice Reviewed last lipids and LFTs Lab Results  Component Value Date   CHOL 141 09/02/2022   HDL 47 (L) 09/02/2022   LDLCALC 70 09/02/2022   TRIG 154 (H) 09/02/2022   CHOLHDL 3.0 09/02/2022  Continue lipitor 80 and continue diet and lifestyle efforts, will be due for labs an next f/up visit (6 months) Due for annual labs today  Orders: -     Lipid panel  Encounter for medication monitoring -     CBC with Differential/Platelet -     Comprehensive metabolic panel with GFR -     Lipid panel -     Hemoglobin A1c  Prediabetes Assessment & Plan: Lab Results  Component Value Date   HGBA1C 5.6 08/05/2021   Last A1C was in normal range - will check glucose and A1C  Orders: -     Comprehensive  metabolic panel with GFR -     Hemoglobin A1c  Class 2 obesity without serious comorbidity with body mass index (BMI) of 36.0 to 36.9 in adult, unspecified obesity type Assessment & Plan: Reviewed weights, fairly stable Wt Readings from Last 5 Encounters:  09/14/23 208 lb (94.3 kg)  03/07/23 200 lb (90.7 kg)  09/02/22 199 lb 4.8 oz (90.4 kg)  02/17/22 195 lb 6.4 oz (88.6 kg)  08/04/21 198 lb 8 oz (90 kg)   BMI Readings from Last 5 Encounters:  09/14/23 36.85 kg/m  03/07/23 35.43 kg/m  09/02/22 35.30 kg/m  02/17/22 34.61 kg/m  08/04/21 35.16 kg/m   With multiple associated comorbidities HTN, HLD, CKD stage 3 and hypothyroid Weight increased some, despite diet/lifestyle efforts  Class 2 Obesity Class 2 obesity with gradual weight gain despite no change in diet. Discussed potential metabolic changes and energy decline with weight gain. 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 - Encourage increased physical activity as feasible. - Monitor weight and discuss potential interventions if weight gain continues.  Orders: -     CBC with Differential/Platelet -     Comprehensive metabolic panel with GFR -     TSH -     Lipid panel -     Hemoglobin A1c  Post-surgical hypothyroidism Assessment & Plan: Euthyroid sx Stable and steady dosing and med compliance Can monitor TSH annually, no med changes, continue levothyroxine  175 mcg daily  Lab Results  Component Value Date   TSH 2.13 09/02/2022   Post-surgical Hypothyroidism Post-surgical hypothyroidism with last thyroid  function test normal. Monitoring required to ensure thyroid  levels remain stable. - Order thyroid  function tests as part of comprehensive blood work.  Orders: -     TSH  Other insomnia -     Pulmonary Visit Fatigue, unspecified type -     Pulmonary Visit Fatigue and Insomnia Chronic fatigue and insomnia with difficulty maintaining sleep, waking every 2-2.5 hours. Possible contributing factors include thyroid  dysfunction, sleep apnea, or other sleep disorders. Experiences fatigue, especially in high altitudes, and has difficulty with energy levels throughout the day. - Refer to sleep medicine specialist, Dr. Jess, for evaluation and possible home sleep study. - Order comprehensive blood tests including thyroid  function tests and A1c.   Assessment & Plan   Recording duration: 18 minutes  Will send in refills on statin and thyroid  meds after reviewing labs   Return in about 6 months (around 03/13/2024) for Routine follow-up.   Michelene Cower, PA-C 09/14/23 2:14 PM

## 2023-09-14 NOTE — Patient Instructions (Signed)
 Please chat with pulmonary and the sleep medicine specialist  If your fatigue or other symptoms worsen and out labs today are normal please follow up and we can recheck labs or recheck you and discuss possible other approaches.

## 2023-09-14 NOTE — Assessment & Plan Note (Signed)
 Euthyroid sx Stable and steady dosing and med compliance Can monitor TSH annually, no med changes, continue levothyroxine  175 mcg daily  Lab Results  Component Value Date   TSH 2.13 09/02/2022

## 2023-09-15 ENCOUNTER — Ambulatory Visit: Payer: Self-pay | Admitting: Family Medicine

## 2023-09-15 DIAGNOSIS — G2581 Restless legs syndrome: Secondary | ICD-10-CM

## 2023-09-15 DIAGNOSIS — M5116 Intervertebral disc disorders with radiculopathy, lumbar region: Secondary | ICD-10-CM

## 2023-09-15 LAB — COMPREHENSIVE METABOLIC PANEL WITH GFR
AG Ratio: 1.6 (calc) (ref 1.0–2.5)
ALT: 18 U/L (ref 6–29)
AST: 19 U/L (ref 10–35)
Albumin: 4.3 g/dL (ref 3.6–5.1)
Alkaline phosphatase (APISO): 71 U/L (ref 37–153)
BUN/Creatinine Ratio: 39 (calc) — ABNORMAL HIGH (ref 6–22)
BUN: 31 mg/dL — ABNORMAL HIGH (ref 7–25)
CO2: 28 mmol/L (ref 20–32)
Calcium: 9.2 mg/dL (ref 8.6–10.4)
Chloride: 100 mmol/L (ref 98–110)
Creat: 0.8 mg/dL (ref 0.60–1.00)
Globulin: 2.7 g/dL (ref 1.9–3.7)
Glucose, Bld: 150 mg/dL — ABNORMAL HIGH (ref 65–99)
Potassium: 4.2 mmol/L (ref 3.5–5.3)
Sodium: 138 mmol/L (ref 135–146)
Total Bilirubin: 0.4 mg/dL (ref 0.2–1.2)
Total Protein: 7 g/dL (ref 6.1–8.1)
eGFR: 77 mL/min/1.73m2 (ref >=60)

## 2023-09-15 LAB — CBC WITH DIFFERENTIAL/PLATELET
Absolute Lymphocytes: 988 {cells}/uL (ref 850–3900)
Absolute Monocytes: 530 {cells}/uL (ref 200–950)
Basophils Absolute: 21 {cells}/uL (ref 0–200)
Basophils Relative: 0.2 %
Eosinophils Absolute: 0 {cells}/uL — ABNORMAL LOW (ref 15–500)
Eosinophils Relative: 0 %
HCT: 40.9 % (ref 35.0–45.0)
Hemoglobin: 13.4 g/dL (ref 11.7–15.5)
MCH: 32.8 pg (ref 27.0–33.0)
MCHC: 32.8 g/dL (ref 32.0–36.0)
MCV: 100 fL (ref 80.0–100.0)
MPV: 11.1 fL (ref 7.5–12.5)
Monocytes Relative: 5.1 %
Neutro Abs: 8861 {cells}/uL — ABNORMAL HIGH (ref 1500–7800)
Neutrophils Relative %: 85.2 %
Platelets: 317 Thousand/uL (ref 140–400)
RBC: 4.09 Million/uL (ref 3.80–5.10)
RDW: 12.3 % (ref 11.0–15.0)
Total Lymphocyte: 9.5 %
WBC: 10.4 Thousand/uL (ref 3.8–10.8)

## 2023-09-15 LAB — LIPID PANEL
Cholesterol: 143 mg/dL (ref <200)
HDL: 48 mg/dL — ABNORMAL LOW (ref >=50)
LDL Cholesterol (Calc): 68 mg/dL
Non-HDL Cholesterol (Calc): 95 mg/dL (ref <130)
Total CHOL/HDL Ratio: 3 (calc) (ref <5.0)
Triglycerides: 200 mg/dL — ABNORMAL HIGH (ref <150)

## 2023-09-15 LAB — HEMOGLOBIN A1C
Hgb A1c MFr Bld: 5.7 % — ABNORMAL HIGH (ref <5.7)
Mean Plasma Glucose: 117 mg/dL
eAG (mmol/L): 6.5 mmol/L

## 2023-09-15 LAB — TSH: TSH: 0.66 m[IU]/L (ref 0.40–4.50)

## 2023-09-15 MED ORDER — PREGABALIN 25 MG PO CAPS: 25.0000 mg | ORAL_CAPSULE | Freq: Every evening | ORAL | 1 refills | Status: AC | PRN

## 2023-09-15 NOTE — Telephone Encounter (Signed)
 Responded to pt through her result note through mychart to address all concerns

## 2023-09-19 LAB — GENECONNECT MOLECULAR SCREEN: Genetic Analysis Overall Interpretation: NEGATIVE

## 2023-09-26 ENCOUNTER — Encounter: Payer: Self-pay | Admitting: Sleep Medicine

## 2023-09-26 ENCOUNTER — Ambulatory Visit (INDEPENDENT_AMBULATORY_CARE_PROVIDER_SITE_OTHER): Admitting: Sleep Medicine

## 2023-09-26 VITALS — BP 110/60 | HR 77 | Temp 99.1°F | Ht 66.0 in | Wt 206.8 lb

## 2023-09-26 DIAGNOSIS — F5104 Psychophysiologic insomnia: Secondary | ICD-10-CM

## 2023-09-26 DIAGNOSIS — R0683 Snoring: Secondary | ICD-10-CM

## 2023-09-26 DIAGNOSIS — I10 Essential (primary) hypertension: Secondary | ICD-10-CM

## 2023-09-26 DIAGNOSIS — G4733 Obstructive sleep apnea (adult) (pediatric): Secondary | ICD-10-CM

## 2023-09-26 NOTE — Patient Instructions (Signed)
 SABRA

## 2023-09-26 NOTE — Progress Notes (Unsigned)
 Name:Pamela Dodson MRN: [id number] DOB: Feb 16, 1950   CHIEF COMPLAINT:  EXCESSIVE DAYTIME SLEEPINESS   HISTORY OF PRESENT ILLNESS: Pamela Dodson is a 73 y.o. w/ a h/o HTN, hyperlipidemia, obesity and hypothyroidism who presents for c/o snoring which has been present for several years. Reports nocturnal awakenings due to unclear reasons, however does not have difficulty falling back to sleep. Reports a 10 lb weight gain over the last few years. Denies morning headaches, RLS symptoms, dream enactment, cataplexy, hypnagogic or hypnapompic hallucinations. Denies a family history of sleep apnea. Denies drowsy driving. Drinks 1 cup of coffee a few times per week, occasional alcohol use, denies tobacco or illicit drug use.   Bedtime 10:30-11 pm Sleep onset 20-60 mins Rise time 9:30-10 am   EPWORTH SLEEP SCORE 4    09/26/2023    2:00 PM  Results of the Epworth flowsheet  Sitting and reading 1  Watching TV 0  Sitting, inactive in a public place (e.g. 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  has a past surgical history that includes Thyroidectomy (2009); Total knee arthroplasty (Bilateral, 2009); Appendectomy; Cholecystectomy; Cesarean section; Ganglion cyst excision; knee arthroscopy; Total hip arthroplasty (12/16/2010); Joint replacement; Carpal tunnel release (Left, 2016); Colonoscopy with propofol  (N/A, 08/15/2014); Glomus tumor excision (Left, 12/12/2017); Carpal tunnel release (Right, 05/05/2019); Colon surgery; Colostomy reversal; and Total hip arthroplasty (Right, 01/09/2020). Prior to Admission medications   Medication Sig Start Date End Date Taking? Authorizing Provider  acetaminophen  (TYLENOL ) 500 MG tablet Take by mouth.   Yes [provider]  amLODipine -benazepril  (LOTREL) 10-40 MG capsule TAKE 1 CAPSULE BY MOUTH AT  BEDTIME 08/16/23  Yes Tapia, Leisa, PA-C  Ascorbic Acid (VITAMIN C) 100 MG tablet Take 100 mg by mouth daily.   Yes [provider]  atorvastatin  (LIPITOR) 80 MG tablet Take 1 tablet (80 mg total) by mouth daily. 09/05/22  Yes Tapia, Leisa, PA-C  Cholecalciferol 1000 UNITS capsule Take 1,000 Units by mouth daily.   Yes [provider]  Cholecalciferol 25 MCG (1000 UT) capsule Take by mouth.   Yes [provider]  ipratropium-albuterol  (DUONEB) 0.5-2.5 (3) MG/3ML SOLN Take 3 mLs by nebulization every 2 (two) hours as needed. 01/16/21  Yes Bernardo Fend, DO  KERENDIA 10 MG TABS Take 1 tablet by mouth daily.   Yes [provider]  levothyroxine  (SYNTHROID ) 175 MCG tablet TAKE 1 TABLET BY MOUTH DAILY  BEFORE BREAKFAST 03/23/23  Yes Tapia, Leisa, PA-C  loratadine  (CLARITIN ) 10 MG tablet Take 10 mg by mouth daily.   Yes [provider]  Multiple Vitamin (MULTIVITAMIN) tablet Take 1 tablet by mouth daily.  Yes [provider]  Omega-3 Fatty Acids (FISH OIL) 1000 MG CPDR Take 1,000 mg by mouth 2 (two) times daily. 11/02/16  Yes Lada, Newell SQUIBB, MD  pregabalin  (LYRICA ) 25 MG capsule Take  1-2 capsules (25-50 mg total) by mouth at bedtime as needed. 09/15/23  Yes Tapia, Leisa, PA-C  pregabalin  (LYRICA ) 50 MG capsule Take 50 mg by mouth at bedtime. 09/15/23  Yes [provider]  valACYclovir  (VALTREX ) 1000 MG tablet TAKE 1 TABLET BY MOUTH  DAILY FOR 5 DAYS AT FIRST  SIGN OF COLD SORE. MAY TAKE 1 TABLET BY MOUTH DAILY FOR PREVENTION 08/08/23  Yes Pender, Julie F, FNP  venlafaxine  XR (EFFEXOR -XR) 37.5 MG 24 hr capsule Take 1 capsule (37.5 mg total) by mouth daily with breakfast. 03/07/23  Yes Tapia, Leisa, PA-C   Allergies  Allergen Reactions   Toradol [Ketorolac Tromethamine] Other (See Comments)    Intolerance to NSAIDs due to CKD    FAMILY HISTORY:  family history includes Atrial fibrillation in her brother and paternal uncle; Birth defects in her son; Cancer in her mother; Dementia in her maternal grandfather; Heart attack in her paternal aunt and paternal grandfather; Heart disease in her paternal aunt and paternal grandmother; Hypertension in her brother; Hypothyroidism in her mother; Myelodysplastic syndrome in her mother; Stroke in her brother, paternal aunt, and paternal grandmother. SOCIAL HISTORY:  reports that she has never smoked. She has never used smokeless tobacco. She reports current alcohol use of about 1.0 standard drink of alcohol per week. She reports that she does not use drugs.   Review of Systems:  Gen:  Denies  fever, sweats, chills weight loss  HEENT: Denies blurred vision, double vision, ear pain, eye pain, hearing loss, nose bleeds, sore throat Cardiac:  No dizziness, chest pain or heaviness, chest tightness,edema, No JVD Resp:   No cough, -sputum production, -shortness of breath,-wheezing, -hemoptysis,  Gi: Denies swallowing difficulty, stomach pain, nausea or vomiting, diarrhea, constipation, bowel incontinence Gu:  Denies bladder incontinence, burning urine Ext:   Denies Joint pain, stiffness or swelling Skin: Denies  skin rash, easy bruising or  bleeding or hives Endoc:  Denies polyuria, polydipsia , polyphagia or weight change Psych:   Denies depression, insomnia or hallucinations  Other:  All other systems negative  VITAL SIGNS: There were no vitals taken for this visit.   Physical Examination:   General Appearance: No distress  EYES PERRLA, EOM intact.   NECK Supple, No JVD Pulmonary: normal breath sounds, No wheezing.  CardiovascularNormal S1,S2.  No m/r/g.   Abdomen: Benign, Soft, non-tender. Skin:   warm, no rashes, no ecchymosis  Extremities: normal, no cyanosis, clubbing. Neuro:without focal findings,  speech normal  PSYCHIATRIC: Mood, affect within normal limits.   ASSESSMENT AND PLAN  OSA I suspect that OSA is likely present due to clinical presentation. Discussed the consequences of untreated sleep apnea. Advised not to drive drowsy for safety of patient and others. Will complete further evaluation with a home sleep study and follow up to review results.    HTN Stable, on current management. Following with PCP.    MEDICATION ADJUSTMENTS/LABS AND TESTS ORDERED: Recommend Sleep Study   Patient  satisfied with Plan of action and management. All questions answered  Follow up to review HST results and treatment plan.   I spent a total of  *** minutes reviewing chart data, face-to-face evaluation with the patient, counseling and coordination of care as detailed above.    Lyssa Hackley, M.D.  Sleep Medicine  [facility]

## 2023-10-07 ENCOUNTER — Encounter

## 2023-10-07 DIAGNOSIS — G4733 Obstructive sleep apnea (adult) (pediatric): Secondary | ICD-10-CM

## 2023-10-17 DIAGNOSIS — G4733 Obstructive sleep apnea (adult) (pediatric): Secondary | ICD-10-CM | POA: Diagnosis not present

## 2023-10-20 ENCOUNTER — Ambulatory Visit: Payer: Self-pay

## 2023-10-20 DIAGNOSIS — G4733 Obstructive sleep apnea (adult) (pediatric): Secondary | ICD-10-CM

## 2023-10-20 DIAGNOSIS — F5104 Psychophysiologic insomnia: Secondary | ICD-10-CM

## 2023-10-20 DIAGNOSIS — I10 Essential (primary) hypertension: Secondary | ICD-10-CM

## 2023-10-21 NOTE — Addendum Note (Signed)
 Addended by: Burlie Cajamarca J on: 10/21/2023 11:09 AM   Modules accepted: Orders

## 2023-10-31 ENCOUNTER — Other Ambulatory Visit: Payer: Self-pay | Admitting: Family Medicine

## 2023-10-31 ENCOUNTER — Telehealth: Payer: Self-pay | Admitting: Family Medicine

## 2023-10-31 DIAGNOSIS — I10 Essential (primary) hypertension: Secondary | ICD-10-CM

## 2023-10-31 NOTE — Telephone Encounter (Signed)
valACYclovir (VALTREX) 1000 MG tablet  °

## 2023-11-01 ENCOUNTER — Other Ambulatory Visit: Payer: Self-pay | Admitting: Family Medicine

## 2023-11-01 DIAGNOSIS — E7800 Pure hypercholesterolemia, unspecified: Secondary | ICD-10-CM

## 2023-11-02 ENCOUNTER — Other Ambulatory Visit: Payer: Self-pay | Admitting: Nurse Practitioner

## 2023-11-02 ENCOUNTER — Encounter: Payer: Self-pay | Admitting: Family Medicine

## 2023-11-02 DIAGNOSIS — B001 Herpesviral vesicular dermatitis: Secondary | ICD-10-CM

## 2023-11-02 MED ORDER — VALACYCLOVIR HCL 1 G PO TABS: ORAL_TABLET | ORAL | 3 refills | Status: AC

## 2023-11-02 NOTE — Telephone Encounter (Signed)
 Second request

## 2023-11-02 NOTE — Telephone Encounter (Signed)
 Rx 08/16/23 #90 1RF- may increase to #100 for best benefit  Requested Prescriptions  Pending Prescriptions Disp Refills   amLODipine -benazepril  (LOTREL) 10-40 MG capsule [Pharmacy Med Name: amLODIPine  Besy-Benazepril  HCl 10-40 MG Oral Capsule] 100 capsule 2    Sig: TAKE 1 CAPSULE BY MOUTH AT  BEDTIME     Cardiovascular: CCB + ACEI Combos Passed - 11/02/2023 12:00 PM      Passed - Cr in normal range and within 180 days    Creat  Date Value Ref Range Status  09/14/2023 0.80 0.60 - 1.00 mg/dL Final   Creatinine, Urine  Date Value Ref Range Status  07/14/2017 163 20 - 275 mg/dL Final         Passed - K in normal range and within 180 days    Potassium  Date Value Ref Range Status  09/14/2023 4.2 3.5 - 5.3 mmol/L Final         Passed - Na in normal range and within 180 days    Sodium  Date Value Ref Range Status  09/14/2023 138 135 - 146 mmol/L Final  08/14/2014 144 134 - 144 mmol/L Final         Passed - eGFR is 30 or above and within 180 days    GFR, Est African American  Date Value Ref Range Status  03/09/2019 108 > OR = 60 mL/min/1.95m2 Final   GFR calc Af Amer  Date Value Ref Range Status  03/17/2019 >60 >60 mL/min Final   GFR, Est Non African American  Date Value Ref Range Status  03/09/2019 93 > OR = 60 mL/min/1.70m2 Final   GFR, Estimated  Date Value Ref Range Status  01/10/2020 >60 >60 mL/min Final    Comment:    (NOTE) Calculated using the CKD-EPI Creatinine Equation (2021)    eGFR  Date Value Ref Range Status  09/14/2023 77 > OR = 60 mL/min/1.17m2 Final         Passed - Patient is not pregnant      Passed - Last BP in normal range    BP Readings from Last 1 Encounters:  09/26/23 110/60         Passed - Valid encounter within last 6 months    Recent Outpatient Visits           1 month ago Primary hypertension   [place] [facility] Leavy Mole, PA-C   8 months ago Primary hypertension   [facility]  Leavy Mole, PA-C

## 2023-11-02 NOTE — Telephone Encounter (Signed)
 Requested Prescriptions  Pending Prescriptions Disp Refills   atorvastatin  (LIPITOR) 80 MG tablet [Pharmacy Med Name: Atorvastatin  Calcium  80 MG Oral Tablet] 100 tablet 0    Sig: TAKE 1 TABLET BY MOUTH DAILY     Cardiovascular:  Antilipid - Statins Failed - 11/02/2023  2:04 PM      Failed - Lipid Panel in normal range within the last 12 months    Cholesterol, Total  Date Value Ref Range Status  08/14/2014 193 100 - 199 mg/dL Final   Cholesterol  Date Value Ref Range Status  09/14/2023 143 <200 mg/dL Final   LDL Cholesterol (Calc)  Date Value Ref Range Status  09/14/2023 68 mg/dL (calc) Final    Comment:    Reference range: <100 . Desirable range <100 mg/dL for primary prevention;   <70 mg/dL for patients with CHD or diabetic patients  with > or = 2 CHD risk factors. SABRA LDL-C is now calculated using the Martin-Hopkins  calculation, which is a validated novel method providing  better accuracy than the Friedewald equation in the  estimation of LDL-C.  Gladis APPLETHWAITE et al. SANDREA. 7986;689(80): 2061-2068  (http://education.[facility].com/faq/FAQ164)    HDL  Date Value Ref Range Status  09/14/2023 48 (L) > OR = 50 mg/dL Final  91/89/7983 41 >60 mg/dL Final    Comment:    According to ATP-III Guidelines, HDL-C >59 mg/dL is considered a negative risk factor for CHD.    Triglycerides  Date Value Ref Range Status  09/14/2023 200 (H) <150 mg/dL Final    Comment:    . If a non-fasting specimen was collected, consider repeat triglyceride testing on a fasting specimen if clinically indicated.  Veatrice et al. J. of Clin. Lipidol. 2015;9:129-169. SABRA          Passed - Patient is not pregnant      Passed - Valid encounter within last 12 months    Recent Outpatient Visits           1 month ago Primary hypertension   [place] [facility] Leavy Mole, PA-C   8 months ago Primary hypertension   [place] [facility] Leavy Mole, PA-C

## 2023-11-25 ENCOUNTER — Other Ambulatory Visit: Payer: Self-pay

## 2023-11-25 MED ORDER — FLUZONE HIGH-DOSE 0.5 ML IM SUSY
0.5000 mL | PREFILLED_SYRINGE | Freq: Once | INTRAMUSCULAR | 0 refills | Status: AC
  Filled 2023-11-25: qty 0.5, 1d supply, fill #0

## 2023-12-06 NOTE — Discharge Instructions (Signed)

## 2023-12-12 ENCOUNTER — Other Ambulatory Visit: Payer: Self-pay | Admitting: Family Medicine

## 2023-12-12 ENCOUNTER — Telehealth: Payer: Self-pay | Admitting: Family Medicine

## 2023-12-12 ENCOUNTER — Encounter: Payer: Self-pay | Admitting: Ophthalmology

## 2023-12-12 ENCOUNTER — Telehealth: Payer: Self-pay

## 2023-12-12 DIAGNOSIS — E890 Postprocedural hypothyroidism: Secondary | ICD-10-CM

## 2023-12-12 MED ORDER — LEVOTHYROXINE SODIUM 175 MCG PO TABS: 175.0000 ug | ORAL_TABLET | Freq: Every day | ORAL | 1 refills | Status: DC

## 2023-12-12 NOTE — Telephone Encounter (Signed)
 Alvogen-Levothyoxine 175mcg

## 2023-12-12 NOTE — Telephone Encounter (Signed)
 It wanit let me T up the thyroid  medication for refill? Says its locked?

## 2023-12-13 ENCOUNTER — Ambulatory Visit
Admission: RE | Admit: 2023-12-13 | Discharge: 2023-12-13 | Disposition: A | Attending: Ophthalmology | Admitting: Ophthalmology

## 2023-12-13 ENCOUNTER — Other Ambulatory Visit: Payer: Self-pay

## 2023-12-13 ENCOUNTER — Encounter: Payer: Self-pay | Admitting: Ophthalmology

## 2023-12-13 ENCOUNTER — Encounter: Admission: RE | Disposition: A | Payer: Self-pay | Source: Home / Self Care | Attending: Ophthalmology

## 2023-12-13 ENCOUNTER — Ambulatory Visit: Admitting: Anesthesiology

## 2023-12-13 HISTORY — PX: CATARACT EXTRACTION W/PHACO: SHX586

## 2023-12-13 HISTORY — DX: Sleep apnea, unspecified: G47.30

## 2023-12-13 HISTORY — DX: Prediabetes: R73.03

## 2023-12-13 SURGERY — PHACOEMULSIFICATION, CATARACT, WITH IOL INSERTION
Anesthesia: Topical | Site: Eye | Laterality: Right

## 2023-12-13 MED ORDER — FENTANYL CITRATE (PF) 100 MCG/2ML IJ SOLN
INTRAMUSCULAR | Status: AC
  Filled 2023-12-13: qty 2

## 2023-12-13 MED ORDER — TETRACAINE HCL 0.5 % OP SOLN
OPHTHALMIC | Status: AC
  Filled 2023-12-13: qty 4

## 2023-12-13 MED ORDER — MIDAZOLAM HCL 5 MG/5ML IJ SOLN
INTRAMUSCULAR | Status: DC | PRN
  Administered 2023-12-13: 2 mg via INTRAVENOUS

## 2023-12-13 MED ORDER — MOXIFLOXACIN HCL 0.5 % OP SOLN
OPHTHALMIC | Status: DC | PRN
  Administered 2023-12-13: .2 mL

## 2023-12-13 MED ORDER — LIDOCAINE HCL (PF) 2 % IJ SOLN
INTRAOCULAR | Status: DC | PRN
  Administered 2023-12-13: 2 mL

## 2023-12-13 MED ORDER — BRIMONIDINE TARTRATE-TIMOLOL 0.2-0.5 % OP SOLN
OPHTHALMIC | Status: DC | PRN
  Administered 2023-12-13: 1 [drp] via OPHTHALMIC

## 2023-12-13 MED ORDER — PHENYLEPHRINE HCL 10 % OP SOLN
OPHTHALMIC | Status: AC
  Filled 2023-12-13: qty 5

## 2023-12-13 MED ORDER — SIGHTPATH DOSE#1 BSS IO SOLN
INTRAOCULAR | Status: DC | PRN
  Administered 2023-12-13: 15 mL via INTRAOCULAR

## 2023-12-13 MED ORDER — PHENYLEPHRINE HCL 10 % OP SOLN
1.0000 [drp] | OPHTHALMIC | Status: AC
  Administered 2023-12-13 (×3): 1 [drp] via OPHTHALMIC

## 2023-12-13 MED ORDER — FENTANYL CITRATE (PF) 100 MCG/2ML IJ SOLN
INTRAMUSCULAR | Status: DC | PRN
  Administered 2023-12-13 (×2): 50 ug via INTRAVENOUS

## 2023-12-13 MED ORDER — SIGHTPATH DOSE#1 NA CHONDROIT SULF-NA HYALURON 40-17 MG/ML IO SOLN
INTRAOCULAR | Status: DC | PRN
  Administered 2023-12-13: 1 mL via INTRAOCULAR

## 2023-12-13 MED ORDER — CYCLOPENTOLATE HCL 2 % OP SOLN
1.0000 [drp] | OPHTHALMIC | Status: AC
  Administered 2023-12-13 (×3): 1 [drp] via OPHTHALMIC

## 2023-12-13 MED ORDER — CYCLOPENTOLATE HCL 2 % OP SOLN
OPHTHALMIC | Status: AC
  Filled 2023-12-13: qty 2

## 2023-12-13 MED ORDER — TETRACAINE HCL 0.5 % OP SOLN
1.0000 [drp] | OPHTHALMIC | Status: DC | PRN
  Administered 2023-12-13 (×3): 1 [drp] via OPHTHALMIC

## 2023-12-13 MED ORDER — SIGHTPATH DOSE#1 BSS IO SOLN
INTRAOCULAR | Status: DC | PRN
  Administered 2023-12-13: 50 mL via OPHTHALMIC

## 2023-12-13 MED ORDER — MIDAZOLAM HCL 2 MG/2ML IJ SOLN
INTRAMUSCULAR | Status: AC
  Filled 2023-12-13: qty 2

## 2023-12-13 SURGICAL SUPPLY — 9 items
CANNULA ANT/CHMB 27G (MISCELLANEOUS) ×1 IMPLANT
CYSTOTOME ANGL RVRS SHRT 25G (CUTTER) ×1 IMPLANT
FEE CATARACT SUITE SIGHTPATH (MISCELLANEOUS) ×1 IMPLANT
GLOVE BIOGEL PI IND STRL 8 (GLOVE) ×1 IMPLANT
GLOVE SURG LX STRL 8.0 MICRO (GLOVE) ×1 IMPLANT
GLOVE SURG SYN 6.5 PF PI BL (GLOVE) ×1 IMPLANT
LENS IOL TECNIS EYHANCE 15.5 (Intraocular Lens) IMPLANT
NDL FILTER BLUNT 18X1 1/2 (NEEDLE) ×1 IMPLANT
SYR 3ML LL SCALE MARK (SYRINGE) ×1 IMPLANT

## 2023-12-13 NOTE — Anesthesia Preprocedure Evaluation (Addendum)
 Anesthesia Evaluation  Patient identified by MRN, date of birth, ID band Patient awake    Reviewed: Allergy & Precautions, H&P , NPO status , Patient's Chart, lab work & pertinent test results  History of Anesthesia Complications (+) PONV  Airway Mallampati: II  TM Distance: >3 FB Neck ROM: full    Dental no notable dental hx.    Pulmonary sleep apnea    Pulmonary exam normal        Cardiovascular hypertension, Normal cardiovascular exam  Coronary atherosclerosis   Neuro/Psych negative neurological ROS  negative psych ROS   GI/Hepatic Neg liver ROS,,,H/O SBO   Endo/Other  Hypothyroidism    Renal/GU   negative genitourinary   Musculoskeletal   Abdominal  (+) + obese  Peds  Hematology negative hematology ROS (+)   Anesthesia Other Findings Past Medical History: No date: Arthritis No date: Blood transfusion 2021: Bowel obstruction (HCC) No date: Cancer (HCC)     Comment:  hx thyroid  cancer No date: Chronic kidney disease     Comment:  PROTEIN LEAK  11/02/2016: Full code status 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 Zachary Bohr, II in [facility] 08/07/2014: H/O malignant neoplasm of thyroid      Comment:  S/P resection x2  08/07/2014: History of knee replacement No date: Hypertension No date: Hypothyroidism No date: PONV (postoperative nausea and vomiting)     Comment:  usually needs zofran /antiemetic prior to surgery No date: Pre-diabetes 03/13/2019: SBO (small bowel obstruction) (HCC) 01/22/2015: Shingles rash No date: Sleep apnea     Comment:  uses CPAP 03/13/2019: Small bowel obstruction (HCC) No date: Thyroid  cancer (HCC) No date: Wears contact lenses  Past Surgical History: No date: APPENDECTOMY 2016: CARPAL TUNNEL RELEASE; Left 05/05/2019: CARPAL TUNNEL RELEASE; Right No date: CESAREAN SECTION     Comment:  X2 No date: CHOLECYSTECTOMY No date: COLON SURGERY     Comment:   diverticulitic mass removed AND COLOSTOMY  08/15/2014: COLONOSCOPY WITH PROPOFOL ; N/A     Comment:  Procedure: COLONOSCOPY WITH PROPOFOL ;  Surgeon: Rogelia Copping, MD;  Location: [facility];  Service:               Endoscopy;  Laterality: N/A; No date: COLOSTOMY REVERSAL No date: GANGLION CYST EXCISION     Comment:  L wrist 12/12/2017: GLOMUS TUMOR EXCISION; Left No date: JOINT REPLACEMENT No date: knee arthroscopy 2009: THYROIDECTOMY 12/16/2010: TOTAL HIP ARTHROPLASTY     Comment:  Procedure: TOTAL HIP ARTHROPLASTY;  Surgeon: Dempsey JINNY Sensor;  Location: [facility];  Service: Orthopedics;                Laterality: Left;  Depuy 01/09/2020: TOTAL HIP ARTHROPLASTY; Right     Comment:  Procedure: TOTAL HIP ARTHROPLASTY ANTERIOR APPROACH;                Surgeon: Leora Lynwood SAUNDERS, MD;  Location: [facility] ORS;                Service: Orthopedics;  Laterality: Right; 2009: TOTAL KNEE ARTHROPLASTY; Bilateral  BMI    Body Mass Index: 35.43 kg/m      Reproductive/Obstetrics negative OB ROS                              Anesthesia Physical  Anesthesia Plan  ASA: 2  Anesthesia Plan: MAC   Post-op Pain Management:    Induction: Intravenous  PONV Risk Score and Plan:   Airway Management Planned: Natural Airway and Nasal Cannula  Additional Equipment:   Intra-op Plan:   Post-operative Plan:   Informed Consent: I have reviewed the patients History and Physical, chart, labs and discussed the procedure including the risks, benefits and alternatives for the proposed anesthesia with the patient or authorized representative who has indicated his/her understanding and acceptance.       Plan Discussed with: CRNA and Surgeon  Anesthesia Plan Comments:          Anesthesia Quick Evaluation

## 2023-12-13 NOTE — H&P (Signed)
 [facility]   Primary Care Physician:  Leavy Mole, PA-C (Inactive) Ophthalmologist: Dr. Elsie Carmine  Pre-Procedure History & Physical: HPI:  Pamela Dodson is a 73 y.o. female here for cataract surgery.   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 Zachary Bohr, II in [place]   H/O malignant neoplasm of thyroid  08/07/2014   S/P resection x2    History of knee replacement 08/07/2014   Hypertension    Hypothyroidism    PONV (postoperative nausea and vomiting)    usually needs zofran /antiemetic prior to surgery   Pre-diabetes    SBO (small bowel obstruction) (HCC) 03/13/2019   Shingles rash 01/22/2015   Sleep apnea    uses CPAP   Small bowel obstruction (HCC) 03/13/2019   Thyroid  cancer (HCC)    Wears contact lenses     Past Surgical History:  Procedure Laterality Date   APPENDECTOMY     CARPAL TUNNEL RELEASE Left 2016   CARPAL TUNNEL RELEASE Right 05/05/2019   CESAREAN SECTION     X2   CHOLECYSTECTOMY     COLON SURGERY     diverticulitic mass removed AND COLOSTOMY    COLONOSCOPY WITH PROPOFOL  N/A 08/15/2014   Procedure: COLONOSCOPY WITH PROPOFOL ;  Surgeon: Rogelia Copping, MD;  Location: [facility];  Service: Endoscopy;  Laterality: N/A;   COLOSTOMY REVERSAL     GANGLION CYST EXCISION     L wrist   GLOMUS TUMOR EXCISION Left 12/12/2017   JOINT REPLACEMENT     knee arthroscopy     THYROIDECTOMY  2009   TOTAL HIP ARTHROPLASTY  12/16/2010   Procedure: TOTAL HIP ARTHROPLASTY;  Surgeon: Dempsey JINNY Sensor;  Location: [facility];  Service: Orthopedics;  Laterality: Left;  Depuy   TOTAL HIP ARTHROPLASTY Right 01/09/2020   Procedure: TOTAL HIP ARTHROPLASTY ANTERIOR APPROACH;  Surgeon: Leora Lynwood SAUNDERS, MD;  Location: [facility];  Service: Orthopedics;  Laterality:  Right;   TOTAL KNEE ARTHROPLASTY Bilateral 2009    Prior to Admission medications   Medication Sig Start Date End Date Taking? Authorizing Provider  acetaminophen  (TYLENOL ) 500 MG tablet Take by mouth.   Yes [provider]  Acetylcarnitine HCl (ACETYL-L-CARNITINE HCL) POWD 1 Application by Does not apply route daily.   Yes [provider]  amLODipine -benazepril  (LOTREL) 10-40 MG capsule TAKE 1 CAPSULE BY MOUTH AT  BEDTIME 11/02/23  Yes Tapia, Leisa, PA-C  Ascorbic Acid (VITAMIN C) 100 MG tablet Take 100 mg by mouth daily.   Yes [provider]  atorvastatin  (LIPITOR) 80 MG tablet TAKE 1 TABLET BY MOUTH DAILY 11/02/23  Yes Tapia, Leisa, PA-C  CALCIUM  MAGNESIUM  ZINC PO Take 1 tablet by mouth daily.   Yes [provider]  Cholecalciferol 25 MCG (1000 UT) capsule Take by mouth.   Yes [provider]  KERENDIA 10 MG TABS Take 1 tablet by mouth daily.   Yes [provider]  levothyroxine  (SYNTHROID ) 175 MCG tablet Take 1 tablet (175 mcg total) by mouth daily before breakfast. 12/12/23  Yes Sowles, Krichna, MD  loratadine  (CLARITIN ) 10 MG tablet Take 10 mg by mouth daily.   Yes [provider]  Multiple Vitamin (MULTIVITAMIN) tablet Take 1 tablet by mouth daily.  Yes [provider]  Omega-3 Fatty Acids (FISH OIL) 1000 MG CPDR Take 1,000 mg by mouth 2 (two) times daily. 11/02/16  Yes Lada, Newell SQUIBB, MD  pregabalin  (LYRICA ) 25 MG capsule Take 1-2 capsules (25-50 mg total) by mouth at bedtime as needed. 09/15/23  Yes Tapia, Leisa, PA-C  pregabalin  (LYRICA ) 50 MG capsule Take 50 mg by mouth at bedtime. 09/15/23  Yes [provider]  Turmeric (QC TUMERIC COMPLEX PO) Take 1 tablet by mouth daily.   Yes [provider]  valACYclovir  (VALTREX ) 1000 MG tablet TAKE 1 TABLET BY MOUTH DAILY FOR PREVENTION 11/02/23  Yes Gareth Mliss FALCON, FNP  venlafaxine  XR (EFFEXOR -XR) 37.5 MG 24 hr capsule Take 1 capsule (37.5 mg total) by  mouth daily with breakfast. 03/07/23  Yes Tapia, Leisa, PA-C    Allergies as of 09/26/2023 - Review Complete 09/26/2023  Allergen Reaction Noted   Toradol [ketorolac tromethamine] Other (See Comments) 01/09/2020    Family History  Problem Relation Age of Onset   Cancer Mother        lung   Myelodysplastic syndrome Mother    Hypothyroidism Mother    Hypertension Brother    Stroke Brother    Atrial fibrillation Brother    Birth defects Son        congenital genetic abnormality   Dementia Maternal Grandfather    Heart disease Paternal Grandmother        heart disease in her 23's   Stroke Paternal Grandmother    Heart attack Paternal Grandfather    Heart disease Paternal Aunt    Heart attack Paternal Aunt    Stroke Paternal Aunt    Atrial fibrillation Paternal Uncle     Social History   Socioeconomic History   Marital status: Married    Spouse name: lynwood   Number of children: 2   Years of education: Not on file   Highest education level: Bachelor's degree (e.g., BA, AB, BS)  Occupational History   Not on file  Tobacco Use   Smoking status: Never   Smokeless tobacco: Never  Vaping Use   Vaping status: Never Used  Substance and Sexual Activity   Alcohol use: Yes    Alcohol/week: 1.0 standard drink of alcohol    Types: 1 Glasses of wine per week    Comment: occasional   Drug use: Never   Sexual activity: Yes    Partners: Male  Other Topics Concern   Not on file  Social History Narrative   Not on file   Social Drivers of Health   Financial Resource Strain: Low Risk  (09/12/2023)   Received from [facility]   Overall Financial Resource Strain (CARDIA)    Difficulty of Paying Living Expenses: Not hard at all  Food Insecurity: No Food Insecurity (09/12/2023)   Received from [facility]   Hunger Vital Sign    Within the past 12 months, you worried that your food would run out before you got the money to buy more.: Never true     Within the past 12 months, the food you bought just didn't last and you didn't have money to get more.: Never true  Transportation Needs: No Transportation Needs (09/12/2023)   Received from [facility] - Transportation    In the past 12 months, has lack of transportation kept you from medical appointments or from getting medications?: No    Lack of Transportation (Non-Medical): No  Physical Activity:  Unknown (03/06/2023)   Exercise Vital Sign    Days of Exercise per Week: Patient declined    Minutes of Exercise per Session: Not on file  Stress: No Stress Concern Present (03/06/2023)   [employer] of Occupational Health - Occupational Stress Questionnaire    Feeling of Stress : Not at all  Social Connections: Socially Integrated (03/06/2023)   Social Connection and Isolation Panel    Frequency of Communication with Friends and Family: More than three times a week    Frequency of Social Gatherings with Friends and Family: Once a week    Attends Religious Services: More than 4 times per year    Active Member of [employer] or Organizations: Yes    Attends Banker Meetings: More than 4 times per year    Marital Status: Married  Catering Manager Violence: Unknown (06/10/2021)   Received from [facility]   HITS    Physically Hurt: Not on file    Insult or Talk Down To: Not on file    Threaten Physical Harm: Not on file    Scream or Curse: Not on file    Review of Systems: See HPI, otherwise negative ROS  Physical Exam: BP (!) 148/74   Pulse 78   Temp (!) 97.4 F (36.3 C) (Temporal)   Resp (!) 26   Ht 5' 3 (1.6 m)   Wt 96.2 kg   BMI 37.55 kg/m  General:   Alert, cooperative. Head:  Normocephalic and atraumatic. Respiratory:  Normal work of breathing. Cardiovascular:  NAD  Impression/Plan: Pamela Dodson is here for cataract surgery.  Risks, benefits, limitations, and alternatives regarding cataract surgery have been reviewed with the  patient.  Questions have been answered.  All parties agreeable.   Elsie Carmine, MD  12/13/2023, 10:12 AM

## 2023-12-13 NOTE — Transfer of Care (Signed)
 Immediate Anesthesia Transfer of Care Note  Patient: Pamela Dodson  Procedure(s) Performed: PHACOEMULSIFICATION, CATARACT, WITH IOL INSERTION 5.13 00:30.9 (Right: Eye)  Patient Location: PACU  Anesthesia Type: MAC  Level of Consciousness: awake, alert  and patient cooperative  Airway and Oxygen Therapy: Patient Spontanous Breathing and Patient connected to supplemental oxygen  Post-op Assessment: Post-op Vital signs reviewed, Patient's Cardiovascular Status Stable, Respiratory Function Stable, Patent Airway and No signs of Nausea or vomiting  Post-op Vital Signs: Reviewed and stable  Complications: No notable events documented.

## 2023-12-13 NOTE — Op Note (Signed)
 PREOPERATIVE DIAGNOSIS:  Nuclear sclerotic cataract of the right eye.   POSTOPERATIVE DIAGNOSIS:  Right Eye Cataract   OPERATIVE PROCEDURE:ORPROCALL@   SURGEON:  Elsie Carmine, MD.   ANESTHESIA:  Anesthesiologist: Vicci Camellia Glatter, MD CRNA: Niki Manus SAUNDERS, CRNA  1.      Managed anesthesia care. 2.      0.31ml of Shugarcaine was instilled in the eye following the paracentesis.   COMPLICATIONS:  None.   TECHNIQUE:   Stop and chop   DESCRIPTION OF PROCEDURE:  The patient was examined and consented in the preoperative holding area where the aforementioned topical anesthesia was applied to the right eye and then brought back to the Operating Room where the right eye was prepped and draped in the usual sterile ophthalmic fashion and a lid speculum was placed. A paracentesis was created with the side port blade and the anterior chamber was filled with viscoelastic. A near clear corneal incision was performed with the steel keratome. A continuous curvilinear capsulorrhexis was performed with a cystotome followed by the capsulorrhexis forceps. Hydrodissection and hydrodelineation were carried out with BSS on a blunt cannula. The lens was removed in a stop and chop  technique and the remaining cortical material was removed with the irrigation-aspiration handpiece. The capsular bag was inflated with viscoelastic and the intraocular lens was placed in the capsular bag without complication. The remaining viscoelastic was removed from the eye with the irrigation-aspiration handpiece. The wounds were hydrated. The anterior chamber was flushed with BSS and the eye was inflated to physiologic pressure. 0.38ml of Vigamox  was placed in the anterior chamber. The wounds were found to be water  tight. The eye was dressed with Combigan . The patient was given protective glasses to wear throughout the day and a shield with which to sleep tonight. The patient was also given drops with which to begin a drop regimen  today and will follow-up with me in one day. Implant Name Type Inv. Item Serial No. Manufacturer Lot No. LRB No. Used Action  LENS IOL TECNIS EYHANCE 15.5 - [id number] Intraocular Lens LENS IOL TECNIS EYHANCE 15.5 [id number] [employer]IGHTPATH  Right 1 Implanted   Procedure(s): PHACOEMULSIFICATION, CATARACT, WITH IOL INSERTION 5.13 00:30.9 (Right)  Electronically signed: Elsie Carmine 12/13/2023 10:36 AM

## 2023-12-13 NOTE — Anesthesia Postprocedure Evaluation (Signed)
 Anesthesia Post Note  Patient: [name] [facility]  Procedure(s) Performed: PHACOEMULSIFICATION, CATARACT, WITH IOL INSERTION 5.13 00:30.9 (Right: Eye)  Patient location during evaluation: PACU Anesthesia Type: MAC Level of consciousness: awake and alert Pain management: pain level controlled Vital Signs Assessment: post-procedure vital signs reviewed and stable Respiratory status: spontaneous breathing, nonlabored ventilation and respiratory function stable Cardiovascular status: stable and blood pressure returned to baseline Postop Assessment: no apparent nausea or vomiting Anesthetic complications: no   No notable events documented.   Last Vitals:  Vitals:   12/13/23 1036 12/13/23 1042  BP:    Pulse:    Resp:    Temp: 36.7 C 36.7 C    Last Pain:  Vitals:   12/13/23 1042  TempSrc:   PainSc: 0-No pain                 Camellia Merilee Louder

## 2023-12-19 ENCOUNTER — Encounter: Payer: Self-pay | Admitting: Internal Medicine

## 2023-12-19 DIAGNOSIS — E890 Postprocedural hypothyroidism: Secondary | ICD-10-CM

## 2023-12-19 MED ORDER — LEVOTHYROXINE SODIUM 175 MCG PO TABS: 175.0000 ug | ORAL_TABLET | Freq: Every day | ORAL | 1 refills | Status: AC

## 2023-12-22 NOTE — Anesthesia Preprocedure Evaluation (Signed)
 Anesthesia Evaluation    Airway Mallampati: II  TM Distance: >3 FB     Dental no notable dental hx.    Pulmonary           Cardiovascular hypertension,      Neuro/Psych    GI/Hepatic   Endo/Other    Renal/GU      Musculoskeletal   Abdominal   Peds  Hematology   Anesthesia Other Findings Previous cataract 12-13-23 Dr. 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  Shingles rash Chronic kidney disease  Bowel obstruction (HCC) PONV (postoperative nausea and vomiting)--always gets anti-emetic preop, so ordered zofran  4 mg IV preop  SBO (small bowel obstruction) (HCC) Small bowel obstruction (HCC)  Sleep apnea     Reproductive/Obstetrics                              Anesthesia Physical Anesthesia Plan  ASA: 3  Anesthesia Plan: MAC   Post-op Pain Management:    Induction: Intravenous  PONV Risk Score and Plan:   Airway Management Planned: Natural Airway and Nasal Cannula  Additional Equipment:   Intra-op Plan:   Post-operative Plan:   Informed Consent: I have reviewed the patients History and Physical, chart, labs and discussed the procedure including the risks, benefits and alternatives for the proposed anesthesia with the patient or authorized representative who has indicated his/her understanding and acceptance.     Dental Advisory Given  Plan Discussed with: Anesthesiologist, CRNA and Surgeon  Anesthesia Plan Comments: (Patient consented for risks of anesthesia including but not limited to:  - adverse reactions to medications - damage to eyes, teeth, lips or other oral mucosa - nerve damage due to positioning  - sore throat or hoarseness - Damage to heart, brain, nerves, lungs,  other parts of body or loss of life  Patient voiced understanding and assent.)         Anesthesia Quick Evaluation

## 2023-12-22 NOTE — Discharge Instructions (Signed)

## 2023-12-27 ENCOUNTER — Encounter: Payer: Self-pay | Admitting: Ophthalmology

## 2023-12-27 ENCOUNTER — Ambulatory Visit: Payer: Self-pay | Admitting: Anesthesiology

## 2023-12-27 ENCOUNTER — Other Ambulatory Visit: Payer: Self-pay

## 2023-12-27 ENCOUNTER — Encounter: Admission: RE | Disposition: A | Payer: Self-pay | Source: Home / Self Care | Attending: Ophthalmology

## 2023-12-27 ENCOUNTER — Ambulatory Visit
Admission: RE | Admit: 2023-12-27 | Discharge: 2023-12-27 | Disposition: A | Discharge status: Home / Self Care | Attending: Ophthalmology | Admitting: Ophthalmology

## 2023-12-27 DIAGNOSIS — H2512 Age-related nuclear cataract, left eye: Secondary | ICD-10-CM | POA: Insufficient documentation

## 2023-12-27 DIAGNOSIS — N189 Chronic kidney disease, unspecified: Secondary | ICD-10-CM | POA: Insufficient documentation

## 2023-12-27 DIAGNOSIS — E039 Hypothyroidism, unspecified: Secondary | ICD-10-CM | POA: Diagnosis not present

## 2023-12-27 DIAGNOSIS — G4730 Sleep apnea, unspecified: Secondary | ICD-10-CM | POA: Diagnosis not present

## 2023-12-27 DIAGNOSIS — M1990 Unspecified osteoarthritis, unspecified site: Secondary | ICD-10-CM | POA: Diagnosis not present

## 2023-12-27 DIAGNOSIS — I129 Hypertensive chronic kidney disease with stage 1 through stage 4 chronic kidney disease, or unspecified chronic kidney disease: Secondary | ICD-10-CM | POA: Diagnosis not present

## 2023-12-27 DIAGNOSIS — I2510 Atherosclerotic heart disease of native coronary artery without angina pectoris: Secondary | ICD-10-CM | POA: Diagnosis not present

## 2023-12-27 HISTORY — PX: CATARACT EXTRACTION W/PHACO: SHX586

## 2023-12-27 SURGERY — PHACOEMULSIFICATION, CATARACT, WITH IOL INSERTION
Anesthesia: Monitor Anesthesia Care | Site: Eye | Laterality: Left

## 2023-12-27 MED ORDER — MIDAZOLAM HCL (PF) 2 MG/2ML IJ SOLN
INTRAMUSCULAR | Status: DC | PRN
  Administered 2023-12-27: 2 mg via INTRAVENOUS

## 2023-12-27 MED ORDER — FENTANYL CITRATE (PF) 100 MCG/2ML IJ SOLN
INTRAMUSCULAR | Status: AC
  Filled 2023-12-27: qty 2

## 2023-12-27 MED ORDER — ONDANSETRON HCL 4 MG/2ML IJ SOLN
INTRAMUSCULAR | Status: AC
  Filled 2023-12-27: qty 2

## 2023-12-27 MED ORDER — LACTATED RINGERS IV SOLN: INTRAVENOUS | Status: DC

## 2023-12-27 MED ORDER — CYCLOPENTOLATE HCL 2 % OP SOLN
OPHTHALMIC | Status: AC
  Filled 2023-12-27: qty 2

## 2023-12-27 MED ORDER — PHENYLEPHRINE HCL 10 % OP SOLN
1.0000 [drp] | OPHTHALMIC | Status: DC | PRN
  Administered 2023-12-27 (×2): 1 [drp] via OPHTHALMIC

## 2023-12-27 MED ORDER — FENTANYL CITRATE (PF) 100 MCG/2ML IJ SOLN
INTRAMUSCULAR | Status: DC | PRN
  Administered 2023-12-27: 50 ug via INTRAVENOUS

## 2023-12-27 MED ORDER — TETRACAINE HCL 0.5 % OP SOLN
1.0000 [drp] | OPHTHALMIC | Status: DC | PRN
  Administered 2023-12-27 (×3): 1 [drp] via OPHTHALMIC

## 2023-12-27 MED ORDER — MOXIFLOXACIN HCL 0.5 % OP SOLN
OPHTHALMIC | Status: DC | PRN
  Administered 2023-12-27: .2 mL via OPHTHALMIC

## 2023-12-27 MED ORDER — LIDOCAINE HCL (PF) 2 % IJ SOLN
INTRAOCULAR | Status: DC | PRN
  Administered 2023-12-27: 2 mL

## 2023-12-27 MED ORDER — ONDANSETRON HCL 4 MG/2ML IJ SOLN
4.0000 mg | Freq: Once | INTRAMUSCULAR | Status: AC
  Administered 2023-12-27: 4 mg via INTRAVENOUS

## 2023-12-27 MED ORDER — CYCLOPENTOLATE HCL 2 % OP SOLN
1.0000 [drp] | OPHTHALMIC | Status: DC | PRN
  Administered 2023-12-27 (×2): 1 [drp] via OPHTHALMIC

## 2023-12-27 MED ORDER — PHENYLEPHRINE HCL 10 % OP SOLN
OPHTHALMIC | Status: AC
  Filled 2023-12-27: qty 5

## 2023-12-27 MED ORDER — SIGHTPATH DOSE#1 BSS IO SOLN
INTRAOCULAR | Status: DC | PRN
  Administered 2023-12-27: 52 mL via OPHTHALMIC

## 2023-12-27 MED ORDER — SIGHTPATH DOSE#1 BSS IO SOLN
INTRAOCULAR | Status: DC | PRN
  Administered 2023-12-27: 15 mL via INTRAOCULAR

## 2023-12-27 MED ORDER — MIDAZOLAM HCL 2 MG/2ML IJ SOLN
INTRAMUSCULAR | Status: AC
  Filled 2023-12-27: qty 2

## 2023-12-27 MED ORDER — SIGHTPATH DOSE#1 NA CHONDROIT SULF-NA HYALURON 40-17 MG/ML IO SOLN
INTRAOCULAR | Status: DC | PRN
  Administered 2023-12-27: 1 mL via INTRAOCULAR

## 2023-12-27 MED ORDER — TETRACAINE HCL 0.5 % OP SOLN
OPHTHALMIC | Status: AC
  Filled 2023-12-27: qty 4

## 2023-12-27 MED ORDER — BRIMONIDINE TARTRATE-TIMOLOL 0.2-0.5 % OP SOLN
OPHTHALMIC | Status: DC | PRN
  Administered 2023-12-27: 1 [drp] via OPHTHALMIC

## 2023-12-27 SURGICAL SUPPLY — 10 items
CANNULA ANT/CHMB 27G (MISCELLANEOUS) ×1 IMPLANT
CYSTOTOME ANGL RVRS SHRT 25G (CUTTER) ×1 IMPLANT
FEE CATARACT SUITE SIGHTPATH (MISCELLANEOUS) ×1 IMPLANT
GLOVE BIOGEL PI IND STRL 8 (GLOVE) ×1 IMPLANT
GLOVE SURG LX STRL 8.0 MICRO (GLOVE) ×1 IMPLANT
GLOVE SURG SYN 6.5 PF PI BL (GLOVE) ×1 IMPLANT
LENS IOL TECNIS EYHANCE 17.0 (Intraocular Lens) IMPLANT
NDL FILTER BLUNT 18X1 1/2 (NEEDLE) ×1 IMPLANT
NEEDLE FILTER BLUNT 18X1 1/2 (NEEDLE) ×1 IMPLANT
SYR 3ML LL SCALE MARK (SYRINGE) ×1 IMPLANT

## 2023-12-27 NOTE — Anesthesia Postprocedure Evaluation (Signed)
"   Anesthesia Post Note  Patient: Sammi Stolarz [facility]  Procedure(s) Performed: PHACOEMULSIFICATION, CATARACT, WITH IOL INSERTION 4.57 0031.8 (Left: Eye)  Patient location during evaluation: PACU Anesthesia Type: MAC Level of consciousness: awake and alert Pain management: pain level controlled Vital Signs Assessment: post-procedure vital signs reviewed and stable Respiratory status: spontaneous breathing, nonlabored ventilation, respiratory function stable and patient connected to nasal cannula oxygen Cardiovascular status: stable and blood pressure returned to baseline Postop Assessment: no apparent nausea or vomiting Anesthetic complications: no   No notable events documented.   Last Vitals:  Vitals:   12/27/23 0913 12/27/23 0915  BP: 105/65 102/61  Pulse: 76 65  Resp: 11 18  Temp:  (!) 36.2 C  SpO2: 92% 92%    Last Pain:  Vitals:   12/27/23 0915  TempSrc:   PainSc: 0-No pain                 Kvon Mcilhenny C Sabena Winner      "

## 2023-12-27 NOTE — Telephone Encounter (Signed)
 Erroneous

## 2023-12-27 NOTE — H&P (Signed)
 [facility]   Primary Care Physician:  Leavy Mole, PA-C (Inactive) Ophthalmologist: Dr. Elsie Carmine  Pre-Procedure History & Physical: HPI:  Pamela Dodson is a 73 y.o. female here for cataract surgery.   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 Zachary Bohr, II in [place]   H/O malignant neoplasm of thyroid  08/07/2014   S/P resection x2    History of knee replacement 08/07/2014   Hypertension    Hypothyroidism    PONV (postoperative nausea and vomiting)    usually needs zofran /antiemetic prior to surgery   Pre-diabetes    SBO (small bowel obstruction) (HCC) 03/13/2019   Shingles rash 01/22/2015   Sleep apnea    uses CPAP   Small bowel obstruction (HCC) 03/13/2019   Thyroid  cancer (HCC)    Wears contact lenses     Past Surgical History:  Procedure Laterality Date   APPENDECTOMY     CARPAL TUNNEL RELEASE Left 2016   CARPAL TUNNEL RELEASE Right 05/05/2019   CATARACT EXTRACTION W/PHACO Right 12/13/2023   Procedure: PHACOEMULSIFICATION, CATARACT, WITH IOL INSERTION 5.13 00:30.9;  Surgeon: Carmine Elsie, MD;  Location: [facility];  Service: Ophthalmology;  Laterality: Right;   CESAREAN SECTION     X2   CHOLECYSTECTOMY     COLON SURGERY     diverticulitic mass removed AND COLOSTOMY    COLONOSCOPY WITH PROPOFOL  N/A 08/15/2014   Procedure: COLONOSCOPY WITH PROPOFOL ;  Surgeon: Rogelia Copping, MD;  Location: [facility];  Service: Endoscopy;  Laterality: N/A;   COLOSTOMY REVERSAL     GANGLION CYST EXCISION     L wrist   GLOMUS TUMOR EXCISION Left 12/12/2017   JOINT REPLACEMENT     knee arthroscopy     THYROIDECTOMY  2009   TOTAL HIP ARTHROPLASTY  12/16/2010   Procedure: TOTAL HIP ARTHROPLASTY;  Surgeon: Dempsey JINNY Sensor;  Location: [facility];   Service: Orthopedics;  Laterality: Left;  Depuy   TOTAL HIP ARTHROPLASTY Right 01/09/2020   Procedure: TOTAL HIP ARTHROPLASTY ANTERIOR APPROACH;  Surgeon: Leora Lynwood SAUNDERS, MD;  Location: [facility];  Service: Orthopedics;  Laterality: Right;   TOTAL KNEE ARTHROPLASTY Bilateral 2009    Prior to Admission medications  Medication Sig Start Date End Date Taking? Authorizing Provider  acetaminophen  (TYLENOL ) 500 MG tablet Take by mouth.   Yes [provider]  Acetylcarnitine HCl (ACETYL-L-CARNITINE HCL) POWD 1 Application by Does not apply route daily.   Yes [provider]  amLODipine -benazepril  (LOTREL) 10-40 MG capsule TAKE 1 CAPSULE BY MOUTH AT  BEDTIME 11/02/23  Yes Tapia, Leisa, PA-C  Ascorbic Acid (VITAMIN C) 100 MG tablet Take 100 mg by mouth daily.   Yes [provider]  atorvastatin  (LIPITOR) 80 MG tablet TAKE 1 TABLET BY MOUTH DAILY 11/02/23  Yes Tapia, Leisa, PA-C  CALCIUM  MAGNESIUM  ZINC PO Take 1 tablet by mouth daily.   Yes [provider]  Cholecalciferol 25 MCG (1000 UT) capsule Take by mouth.   Yes [provider]  KERENDIA 10 MG TABS Take 1 tablet by mouth daily.   Yes [provider]  levothyroxine  (SYNTHROID ) 175 MCG tablet Take 1 tablet (175 mcg total) by mouth daily before breakfast. 12/19/23  Yes Bernardo Fend,  DO  loratadine  (CLARITIN ) 10 MG tablet Take 10 mg by mouth daily.   Yes [provider]  Multiple Vitamin (MULTIVITAMIN) tablet Take 1 tablet by mouth daily.   Yes [provider]  Omega-3 Fatty Acids (FISH OIL) 1000 MG CPDR Take 1,000 mg by mouth 2 (two) times daily. 11/02/16  Yes Lada, Newell SQUIBB, MD  pregabalin  (LYRICA ) 25 MG capsule Take 1-2 capsules (25-50 mg total) by mouth at bedtime as needed. 09/15/23  Yes Tapia, Leisa, PA-C  pregabalin  (LYRICA ) 50 MG capsule Take 50 mg by mouth at bedtime. 09/15/23  Yes [provider]  Turmeric (QC TUMERIC COMPLEX PO) Take 1 tablet by mouth daily.    Yes [provider]  valACYclovir  (VALTREX ) 1000 MG tablet TAKE 1 TABLET BY MOUTH DAILY FOR PREVENTION 11/02/23  Yes Gareth Mliss FALCON, FNP  venlafaxine  XR (EFFEXOR -XR) 37.5 MG 24 hr capsule Take 1 capsule (37.5 mg total) by mouth daily with breakfast. 03/07/23  Yes Tapia, Leisa, PA-C    Allergies as of 09/26/2023 - Review Complete 09/26/2023  Allergen Reaction Noted   Toradol [ketorolac tromethamine] Other (See Comments) 01/09/2020    Family History  Problem Relation Age of Onset   Cancer Mother        lung   Myelodysplastic syndrome Mother    Hypothyroidism Mother    Hypertension Brother    Stroke Brother    Atrial fibrillation Brother    Birth defects Son        congenital genetic abnormality   Dementia Maternal Grandfather    Heart disease Paternal Grandmother        heart disease in her 26's   Stroke Paternal Grandmother    Heart attack Paternal Grandfather    Heart disease Paternal Aunt    Heart attack Paternal Aunt    Stroke Paternal Aunt    Atrial fibrillation Paternal Uncle     Social History   Socioeconomic History   Marital status: Married    Spouse name: lynwood   Number of children: 2   Years of education: Not on file   Highest education level: Bachelor's degree (e.g., BA, AB, BS)  Occupational History   Not on file  Tobacco Use   Smoking status: Never   Smokeless tobacco: Never  Vaping Use   Vaping status: Never Used  Substance and Sexual Activity   Alcohol use: Yes    Alcohol/week: 1.0 standard drink of alcohol    Types: 1 Glasses of wine per week    Comment: occasional   Drug use: Never   Sexual activity: Yes    Partners: Male  Other Topics Concern   Not on file  Social History Narrative   Not on file   Social Drivers of Health   Tobacco Use: Low Risk (12/27/2023)   Patient History    Smoking Tobacco Use: Never    Smokeless Tobacco Use: Never    Passive Exposure: Not on file  Financial Resource Strain: Low Risk  (09/12/2023)    Received from [facility]   Overall Financial Resource Strain (CARDIA)    Difficulty of Paying Living Expenses: Not hard at all  Food Insecurity: No Food Insecurity (09/12/2023)   Received from [facility]   Epic    Within the past 12 months, you worried that your food would run out before you got the money to buy more.: Never true    Within the past 12 months, the food you bought just didn't last and  you didn't have money to get more.: Never true  Transportation Needs: No Transportation Needs (09/12/2023)   Received from [facility] - Transportation    In the past 12 months, has lack of transportation kept you from medical appointments or from getting medications?: No    Lack of Transportation (Non-Medical): No  Physical Activity: Unknown (03/06/2023)   Exercise Vital Sign    Days of Exercise per Week: Patient declined    Minutes of Exercise per Session: Not on file  Stress: No Stress Concern Present (03/06/2023)   [employer] of Occupational [employer] - Occupational Stress Questionnaire    Feeling of Stress : Not at all  Social Connections: Socially Integrated (03/06/2023)   Social Connection and Isolation Panel    Frequency of Communication with Friends and Family: More than three times a week    Frequency of Social Gatherings with Friends and Family: Once a week    Attends Religious Services: More than 4 times per year    Active Member of [employer] or Organizations: Yes    Attends Banker Meetings: More than 4 times per year    Marital Status: Married  Catering Manager Violence: Unknown (06/10/2021)   Received from [facility]   HITS    Physically Hurt: Not on file    Insult or Talk Down To: Not on file    Threaten Physical Harm: Not on file    Scream or Curse: Not on file  Depression (PHQ2-9): Low Risk (09/14/2023)   Depression (PHQ2-9)    PHQ-2 Score: 0  Alcohol Screen: Low Risk (03/06/2023)   Alcohol Screen     Last Alcohol Screening Score (AUDIT): 2  Housing: Unknown (09/12/2023)   [facility] from [facility]   Epic    In the last 12 months, was there a time when you were not able to pay the mortgage or rent on time?: No    Number of Times Moved in the Last Year: Not on file    At any time in the past 12 months, were you homeless or living in a shelter (including now)?: No  Utilities: Not At Risk (09/12/2023)   Received from [facility]   Epic    In the past 12 months has the electric, gas, oil, or water  company threatened to shut off services in your home?: No  Health Literacy: Not on file    Review of Systems: See HPI, otherwise negative ROS  Physical Exam: BP 127/87   Pulse 77   Temp (!) 96.9 F (36.1 C) (Temporal)   Resp 12   Ht 5' 3 (1.6 m)   Wt 95.8 kg   SpO2 97%   BMI 37.43 kg/m  General:   Alert, cooperative. Head:  Normocephalic and atraumatic. Respiratory:  Normal work of breathing. Cardiovascular:  NAD  Impression/Plan: Pamela Dodson is here for cataract surgery.  Risks, benefits, limitations, and alternatives regarding cataract surgery have been reviewed with the patient.  Questions have been answered.  All parties agreeable.   Elsie Carmine, MD  12/27/2023, 8:47 AM

## 2023-12-27 NOTE — Op Note (Signed)
 PREOPERATIVE DIAGNOSIS:  Nuclear sclerotic cataract of the left eye.   POSTOPERATIVE DIAGNOSIS:  Nuclear sclerotic cataract of the left eye.   OPERATIVE PROCEDURE:ORPROCALL@   SURGEON:  Elsie Carmine, MD.   ANESTHESIA:  Anesthesiologist: Ola Donny BROCKS, MD CRNA: Myra Lawless, CRNA  1.      Managed anesthesia care. 2.     0.33ml of Shugarcaine was instilled following the paracentesis   COMPLICATIONS:  None.   TECHNIQUE:   Stop and chop   DESCRIPTION OF PROCEDURE:  The patient was examined and consented in the preoperative holding area where the aforementioned topical anesthesia was applied to the left eye and then brought back to the Operating Room where the left eye was prepped and draped in the usual sterile ophthalmic fashion and a lid speculum was placed. A paracentesis was created with the side port blade and the anterior chamber was filled with viscoelastic. A near clear corneal incision was performed with the steel keratome. A continuous curvilinear capsulorrhexis was performed with a cystotome followed by the capsulorrhexis forceps. Hydrodissection and hydrodelineation were carried out with BSS on a blunt cannula. The lens was removed in a stop and chop  technique and the remaining cortical material was removed with the irrigation-aspiration handpiece. The capsular bag was inflated with viscoelastic and the intraocular lens was placed in the capsular bag without complication. The remaining viscoelastic was removed from the eye with the irrigation-aspiration handpiece. The wounds were hydrated. The anterior chamber was flushed with BSS and the eye was inflated to physiologic pressure. 0.36ml Vigamox  was placed in the anterior chamber. The wounds were found to be water  tight. The eye was dressed with Combigan . The patient was given protective glasses to wear throughout the day and a shield with which to sleep tonight. The patient was also given drops with which to begin a drop regimen  today and will follow-up with me in one day. Implant Name Type Inv. Item Serial No. Manufacturer Lot No. LRB No. Used Action  LENS IOL TECNIS EYHANCE 17.0 - [id number] Intraocular Lens LENS IOL TECNIS EYHANCE 17.0 [id number] [employer]  Left 1 Implanted    Procedures: PHACOEMULSIFICATION, CATARACT, WITH IOL INSERTION 4.57 0031.8 (Left)  Electronically signed: Elsie Carmine 12/27/2023 9:08 AM

## 2023-12-27 NOTE — Transfer of Care (Signed)
 Immediate Anesthesia Transfer of Care Note  Patient: Pamela Dodson  Procedure(s) Performed: PHACOEMULSIFICATION, CATARACT, WITH IOL INSERTION 4.57 0031.8 (Left: Eye)  Patient Location: PACU  Anesthesia Type: MAC  Level of Consciousness: awake, alert  and patient cooperative  Airway and Oxygen Therapy: Patient Spontanous Breathing and Patient connected to supplemental oxygen  Post-op Assessment: Post-op Vital signs reviewed, Patient's Cardiovascular Status Stable, Respiratory Function Stable, Patent Airway and No signs of Nausea or vomiting  Post-op Vital Signs: Reviewed and stable  Complications: No notable events documented.

## 2024-02-07 ENCOUNTER — Ambulatory Visit: Admitting: Sleep Medicine

## 2024-02-14 ENCOUNTER — Ambulatory Visit: Admitting: Sleep Medicine

## 2024-02-15 ENCOUNTER — Ambulatory Visit: Admitting: Sleep Medicine

## 2024-03-22 ENCOUNTER — Ambulatory Visit: Admitting: Internal Medicine

## 2024-03-27 ENCOUNTER — Ambulatory Visit: Admitting: Family Medicine

## 2024-03-27 ENCOUNTER — Ambulatory Visit: Admitting: Internal Medicine
